# Patient Record
Sex: Female | Born: 1940
Health system: Southern US, Community
[De-identification: ages and names within clinical notes are randomized; demographics above are authoritative.]

## PROBLEM LIST (undated history)

## (undated) DIAGNOSIS — F329 Major depressive disorder, single episode, unspecified: Secondary | ICD-10-CM

## (undated) DIAGNOSIS — M810 Age-related osteoporosis without current pathological fracture: Secondary | ICD-10-CM

## (undated) DIAGNOSIS — M549 Dorsalgia, unspecified: Secondary | ICD-10-CM

## (undated) DIAGNOSIS — J029 Acute pharyngitis, unspecified: Secondary | ICD-10-CM

## (undated) DIAGNOSIS — G245 Blepharospasm: Secondary | ICD-10-CM

## (undated) DIAGNOSIS — M1990 Unspecified osteoarthritis, unspecified site: Secondary | ICD-10-CM

## (undated) DIAGNOSIS — R5383 Other fatigue: Secondary | ICD-10-CM

## (undated) DIAGNOSIS — H40039 Anatomical narrow angle, unspecified eye: Secondary | ICD-10-CM

## (undated) DIAGNOSIS — H02403 Unspecified ptosis of bilateral eyelids: Secondary | ICD-10-CM

## (undated) DIAGNOSIS — G609 Hereditary and idiopathic neuropathy, unspecified: Secondary | ICD-10-CM

## (undated) DIAGNOSIS — K219 Gastro-esophageal reflux disease without esophagitis: Secondary | ICD-10-CM

## (undated) DIAGNOSIS — M545 Low back pain: Secondary | ICD-10-CM

## (undated) DIAGNOSIS — K5730 Diverticulosis of large intestine without perforation or abscess without bleeding: Secondary | ICD-10-CM

## (undated) DIAGNOSIS — R5381 Other malaise: Secondary | ICD-10-CM

## (undated) DIAGNOSIS — J019 Acute sinusitis, unspecified: Secondary | ICD-10-CM

## (undated) DIAGNOSIS — R05 Cough: Secondary | ICD-10-CM

## (undated) DIAGNOSIS — R9431 Abnormal electrocardiogram [ECG] [EKG]: Secondary | ICD-10-CM

## (undated) DIAGNOSIS — R0600 Dyspnea, unspecified: Secondary | ICD-10-CM

## (undated) DIAGNOSIS — J310 Chronic rhinitis: Secondary | ICD-10-CM

## (undated) DIAGNOSIS — E785 Hyperlipidemia, unspecified: Secondary | ICD-10-CM

## (undated) DIAGNOSIS — J309 Allergic rhinitis, unspecified: Secondary | ICD-10-CM

## (undated) DIAGNOSIS — Z86010 Personal history of colonic polyps: Secondary | ICD-10-CM

## (undated) DIAGNOSIS — F411 Generalized anxiety disorder: Secondary | ICD-10-CM

## (undated) DIAGNOSIS — M5432 Sciatica, left side: Principal | ICD-10-CM

## (undated) HISTORY — DX: Sciatica, left side: M54.32

## (undated) HISTORY — DX: Generalized anxiety disorder: F41.1

## (undated) HISTORY — DX: Acute pharyngitis, unspecified: J02.9

## (undated) HISTORY — PX: TONSILLECTOMY: SUR1361

## (undated) HISTORY — DX: Allergic rhinitis, unspecified: J30.9

## (undated) HISTORY — PX: CATARACT EXTRACTION, BILATERAL: SHX1313

## (undated) HISTORY — DX: Personal history of colonic polyps: Z86.010

## (undated) HISTORY — DX: Abnormal electrocardiogram (ECG) (EKG): R94.31

## (undated) HISTORY — PX: EYE SURGERY: SHX253

## (undated) HISTORY — DX: Cough: R05

## (undated) HISTORY — DX: Hereditary and idiopathic neuropathy, unspecified: G60.9

## (undated) HISTORY — DX: Blepharospasm: G24.5

## (undated) HISTORY — DX: Other malaise: R53.81

## (undated) HISTORY — DX: Unspecified osteoarthritis, unspecified site: M19.90

## (undated) HISTORY — DX: Age-related osteoporosis without current pathological fracture: M81.0

## (undated) HISTORY — DX: Other fatigue: R53.83

## (undated) HISTORY — DX: Hyperlipidemia, unspecified: E78.5

## (undated) HISTORY — DX: Major depressive disorder, single episode, unspecified: F32.9

## (undated) HISTORY — DX: Diverticulosis of large intestine without perforation or abscess without bleeding: K57.30

## (undated) HISTORY — DX: Anatomical narrow angle, unspecified eye: H40.039

## (undated) HISTORY — PX: COLONOSCOPY: SHX174

## (undated) HISTORY — DX: Acute sinusitis, unspecified: J01.90

## (undated) HISTORY — PX: BREAST BIOPSY: SHX20

## (undated) HISTORY — DX: Dorsalgia, unspecified: M54.9

## (undated) HISTORY — DX: Chronic rhinitis: J31.0

## (undated) HISTORY — DX: Low back pain: M54.5

---

## 2001-09-16 HISTORY — PX: OTHER SURGICAL HISTORY: SHX169

## 2002-05-25 ENCOUNTER — Other Ambulatory Visit: Admission: RE | Admit: 2002-05-25 | Discharge: 2002-05-25 | Payer: Self-pay | Admitting: Obstetrics and Gynecology

## 2002-12-14 ENCOUNTER — Other Ambulatory Visit: Admission: RE | Admit: 2002-12-14 | Discharge: 2002-12-14 | Payer: Self-pay | Admitting: Obstetrics and Gynecology

## 2002-12-31 ENCOUNTER — Encounter: Payer: Self-pay | Admitting: Neurology

## 2002-12-31 ENCOUNTER — Encounter: Admission: RE | Admit: 2002-12-31 | Discharge: 2002-12-31 | Payer: Self-pay | Admitting: Neurology

## 2003-05-18 ENCOUNTER — Other Ambulatory Visit: Admission: RE | Admit: 2003-05-18 | Discharge: 2003-05-18 | Payer: Self-pay | Admitting: Obstetrics and Gynecology

## 2003-11-17 ENCOUNTER — Other Ambulatory Visit: Admission: RE | Admit: 2003-11-17 | Discharge: 2003-11-17 | Payer: Self-pay | Admitting: Obstetrics and Gynecology

## 2004-03-24 ENCOUNTER — Emergency Department (HOSPITAL_COMMUNITY): Admission: EM | Admit: 2004-03-24 | Discharge: 2004-03-24 | Payer: Self-pay | Admitting: Emergency Medicine

## 2004-03-26 ENCOUNTER — Emergency Department (HOSPITAL_COMMUNITY): Admission: EM | Admit: 2004-03-26 | Discharge: 2004-03-26 | Payer: Self-pay | Admitting: *Deleted

## 2004-05-22 ENCOUNTER — Other Ambulatory Visit: Admission: RE | Admit: 2004-05-22 | Discharge: 2004-05-22 | Payer: Self-pay | Admitting: Obstetrics and Gynecology

## 2005-04-15 ENCOUNTER — Ambulatory Visit: Payer: Self-pay | Admitting: Internal Medicine

## 2005-04-25 ENCOUNTER — Ambulatory Visit: Payer: Self-pay | Admitting: Internal Medicine

## 2005-06-19 ENCOUNTER — Other Ambulatory Visit: Admission: RE | Admit: 2005-06-19 | Discharge: 2005-06-19 | Payer: Self-pay | Admitting: Obstetrics and Gynecology

## 2005-06-27 ENCOUNTER — Ambulatory Visit: Payer: Self-pay | Admitting: Internal Medicine

## 2006-06-02 ENCOUNTER — Ambulatory Visit: Payer: Self-pay | Admitting: Internal Medicine

## 2006-06-09 ENCOUNTER — Ambulatory Visit: Payer: Self-pay | Admitting: Internal Medicine

## 2006-06-12 ENCOUNTER — Ambulatory Visit: Payer: Self-pay | Admitting: Internal Medicine

## 2006-07-24 ENCOUNTER — Ambulatory Visit: Payer: Self-pay | Admitting: Internal Medicine

## 2006-07-24 LAB — CONVERTED CEMR LAB
ALT: 19 units/L (ref 0–40)
AST: 21 units/L (ref 0–37)
Albumin: 3.7 g/dL (ref 3.5–5.2)
Alkaline Phosphatase: 56 units/L (ref 39–117)
Bilirubin, Direct: 0.1 mg/dL (ref 0.0–0.3)
Chol/HDL Ratio, serum: 2.3
Cholesterol: 187 mg/dL (ref 0–200)
HDL: 80.1 mg/dL (ref >39.0)
LDL Cholesterol: 96 mg/dL (ref 0–99)
Total Bilirubin: 0.9 mg/dL (ref 0.3–1.2)
Total Protein: 7.2 g/dL (ref 6.0–8.3)
Triglyceride fasting, serum: 53 mg/dL (ref 0–149)
VLDL: 11 mg/dL (ref 0–40)

## 2007-06-08 ENCOUNTER — Encounter: Payer: Self-pay | Admitting: *Deleted

## 2007-06-08 DIAGNOSIS — G609 Hereditary and idiopathic neuropathy, unspecified: Secondary | ICD-10-CM

## 2007-06-08 HISTORY — DX: Hereditary and idiopathic neuropathy, unspecified: G60.9

## 2007-06-25 ENCOUNTER — Ambulatory Visit: Payer: Self-pay | Admitting: Internal Medicine

## 2007-06-25 LAB — CONVERTED CEMR LAB
ALT: 22 units/L (ref 0–35)
AST: 23 units/L (ref 0–37)
Albumin: 4.1 g/dL (ref 3.5–5.2)
Alkaline Phosphatase: 61 units/L (ref 39–117)
BUN: 16 mg/dL (ref 6–23)
Basophils Absolute: 0 10*3/uL (ref 0.0–0.1)
Basophils Relative: 0.8 % (ref 0.0–1.0)
Bilirubin, Direct: 0.1 mg/dL (ref 0.0–0.3)
CO2: 33 meq/L — ABNORMAL HIGH (ref 19–32)
Calcium: 9.9 mg/dL (ref 8.4–10.5)
Chloride: 105 meq/L (ref 96–112)
Cholesterol: 201 mg/dL (ref 0–200)
Creatinine, Ser: 0.8 mg/dL (ref 0.4–1.2)
Direct LDL: 112.4 mg/dL
Eosinophils Absolute: 0.1 10*3/uL (ref 0.0–0.6)
Eosinophils Relative: 3.7 % (ref 0.0–5.0)
GFR calc Af Amer: 92 mL/min
GFR calc non Af Amer: 76 mL/min
Glucose, Bld: 84 mg/dL (ref 70–99)
HCT: 45.7 % (ref 36.0–46.0)
HDL: 75.5 mg/dL (ref >39.0)
Hemoglobin: 15.2 g/dL — ABNORMAL HIGH (ref 12.0–15.0)
Lymphocytes Relative: 33.4 % (ref 12.0–46.0)
MCHC: 33.3 g/dL (ref 30.0–36.0)
MCV: 85 fL (ref 78.0–100.0)
Monocytes Absolute: 0.3 10*3/uL (ref 0.2–0.7)
Monocytes Relative: 7.4 % (ref 3.0–11.0)
Neutro Abs: 2.1 10*3/uL (ref 1.4–7.7)
Neutrophils Relative %: 54.7 % (ref 43.0–77.0)
Platelets: 208 10*3/uL (ref 150–400)
Potassium: 3.9 meq/L (ref 3.5–5.1)
RBC: 5.38 M/uL — ABNORMAL HIGH (ref 3.87–5.11)
RDW: 13 % (ref 11.5–14.6)
Sodium: 143 meq/L (ref 135–145)
TSH: 0.99 microintl units/mL (ref 0.35–5.50)
Total Bilirubin: 0.8 mg/dL (ref 0.3–1.2)
Total CHOL/HDL Ratio: 2.7
Total Protein: 7.5 g/dL (ref 6.0–8.3)
Triglycerides: 95 mg/dL (ref 0–149)
VLDL: 19 mg/dL (ref 0–40)
WBC: 3.8 10*3/uL — ABNORMAL LOW (ref 4.5–10.5)

## 2007-07-03 ENCOUNTER — Encounter: Payer: Self-pay | Admitting: Internal Medicine

## 2007-07-03 ENCOUNTER — Ambulatory Visit: Payer: Self-pay

## 2007-07-15 ENCOUNTER — Telehealth (INDEPENDENT_AMBULATORY_CARE_PROVIDER_SITE_OTHER): Payer: Self-pay | Admitting: *Deleted

## 2007-07-17 ENCOUNTER — Ambulatory Visit: Payer: Self-pay | Admitting: Internal Medicine

## 2007-07-19 ENCOUNTER — Encounter: Payer: Self-pay | Admitting: Internal Medicine

## 2007-07-19 DIAGNOSIS — F329 Major depressive disorder, single episode, unspecified: Secondary | ICD-10-CM

## 2007-07-19 DIAGNOSIS — Z8669 Personal history of other diseases of the nervous system and sense organs: Secondary | ICD-10-CM | POA: Insufficient documentation

## 2007-07-19 DIAGNOSIS — E785 Hyperlipidemia, unspecified: Secondary | ICD-10-CM | POA: Insufficient documentation

## 2007-07-19 DIAGNOSIS — M545 Low back pain, unspecified: Secondary | ICD-10-CM | POA: Insufficient documentation

## 2007-07-19 DIAGNOSIS — R9431 Abnormal electrocardiogram [ECG] [EKG]: Secondary | ICD-10-CM | POA: Insufficient documentation

## 2007-07-19 DIAGNOSIS — K5730 Diverticulosis of large intestine without perforation or abscess without bleeding: Secondary | ICD-10-CM | POA: Insufficient documentation

## 2007-07-19 DIAGNOSIS — J309 Allergic rhinitis, unspecified: Secondary | ICD-10-CM | POA: Insufficient documentation

## 2007-07-19 DIAGNOSIS — F411 Generalized anxiety disorder: Secondary | ICD-10-CM

## 2007-07-19 DIAGNOSIS — F418 Other specified anxiety disorders: Secondary | ICD-10-CM | POA: Insufficient documentation

## 2007-07-19 DIAGNOSIS — M1990 Unspecified osteoarthritis, unspecified site: Secondary | ICD-10-CM

## 2007-07-19 DIAGNOSIS — Z860100 Personal history of colon polyps, unspecified: Secondary | ICD-10-CM

## 2007-07-19 DIAGNOSIS — F32A Depression, unspecified: Secondary | ICD-10-CM | POA: Insufficient documentation

## 2007-07-19 DIAGNOSIS — M810 Age-related osteoporosis without current pathological fracture: Secondary | ICD-10-CM | POA: Insufficient documentation

## 2007-07-19 DIAGNOSIS — F3289 Other specified depressive episodes: Secondary | ICD-10-CM

## 2007-07-19 HISTORY — DX: Diverticulosis of large intestine without perforation or abscess without bleeding: K57.30

## 2007-07-19 HISTORY — DX: Low back pain, unspecified: M54.50

## 2007-07-19 HISTORY — DX: Hyperlipidemia, unspecified: E78.5

## 2007-07-19 HISTORY — DX: Personal history of colonic polyps: Z86.010

## 2007-07-19 HISTORY — DX: Personal history of colon polyps, unspecified: Z86.0100

## 2007-07-19 HISTORY — DX: Other specified depressive episodes: F32.89

## 2007-07-19 HISTORY — DX: Generalized anxiety disorder: F41.1

## 2007-07-19 HISTORY — DX: Major depressive disorder, single episode, unspecified: F32.9

## 2007-07-19 HISTORY — DX: Allergic rhinitis, unspecified: J30.9

## 2007-07-19 HISTORY — DX: Unspecified osteoarthritis, unspecified site: M19.90

## 2007-07-19 HISTORY — DX: Age-related osteoporosis without current pathological fracture: M81.0

## 2007-07-19 HISTORY — DX: Abnormal electrocardiogram (ECG) (EKG): R94.31

## 2007-09-22 ENCOUNTER — Ambulatory Visit: Payer: Self-pay | Admitting: Internal Medicine

## 2007-09-22 DIAGNOSIS — R059 Cough, unspecified: Secondary | ICD-10-CM

## 2007-09-22 DIAGNOSIS — R05 Cough: Secondary | ICD-10-CM

## 2007-09-22 HISTORY — DX: Cough, unspecified: R05.9

## 2007-10-07 ENCOUNTER — Ambulatory Visit (HOSPITAL_COMMUNITY): Admission: RE | Admit: 2007-10-07 | Discharge: 2007-10-07 | Payer: Self-pay | Admitting: Internal Medicine

## 2007-10-07 ENCOUNTER — Encounter: Payer: Self-pay | Admitting: Internal Medicine

## 2007-11-02 ENCOUNTER — Ambulatory Visit: Payer: Self-pay | Admitting: Internal Medicine

## 2007-11-04 ENCOUNTER — Ambulatory Visit: Payer: Self-pay | Admitting: Internal Medicine

## 2007-11-04 DIAGNOSIS — J019 Acute sinusitis, unspecified: Secondary | ICD-10-CM

## 2007-11-04 HISTORY — DX: Acute sinusitis, unspecified: J01.90

## 2008-06-06 ENCOUNTER — Ambulatory Visit: Payer: Self-pay | Admitting: Internal Medicine

## 2008-06-06 DIAGNOSIS — G245 Blepharospasm: Secondary | ICD-10-CM

## 2008-06-06 HISTORY — DX: Blepharospasm: G24.5

## 2008-06-15 ENCOUNTER — Encounter: Payer: Self-pay | Admitting: Internal Medicine

## 2008-10-05 ENCOUNTER — Ambulatory Visit: Payer: Self-pay | Admitting: Internal Medicine

## 2008-10-20 ENCOUNTER — Ambulatory Visit: Payer: Self-pay | Admitting: Internal Medicine

## 2008-12-12 IMAGING — CR DG CHEST 2V
1 series · 1 of 1 positions shown · non-contrast
Comparison: 06/27/05.

CLINICAL DATA: Cough.  
 CHEST ? 2 VIEW:

[view not recorded]
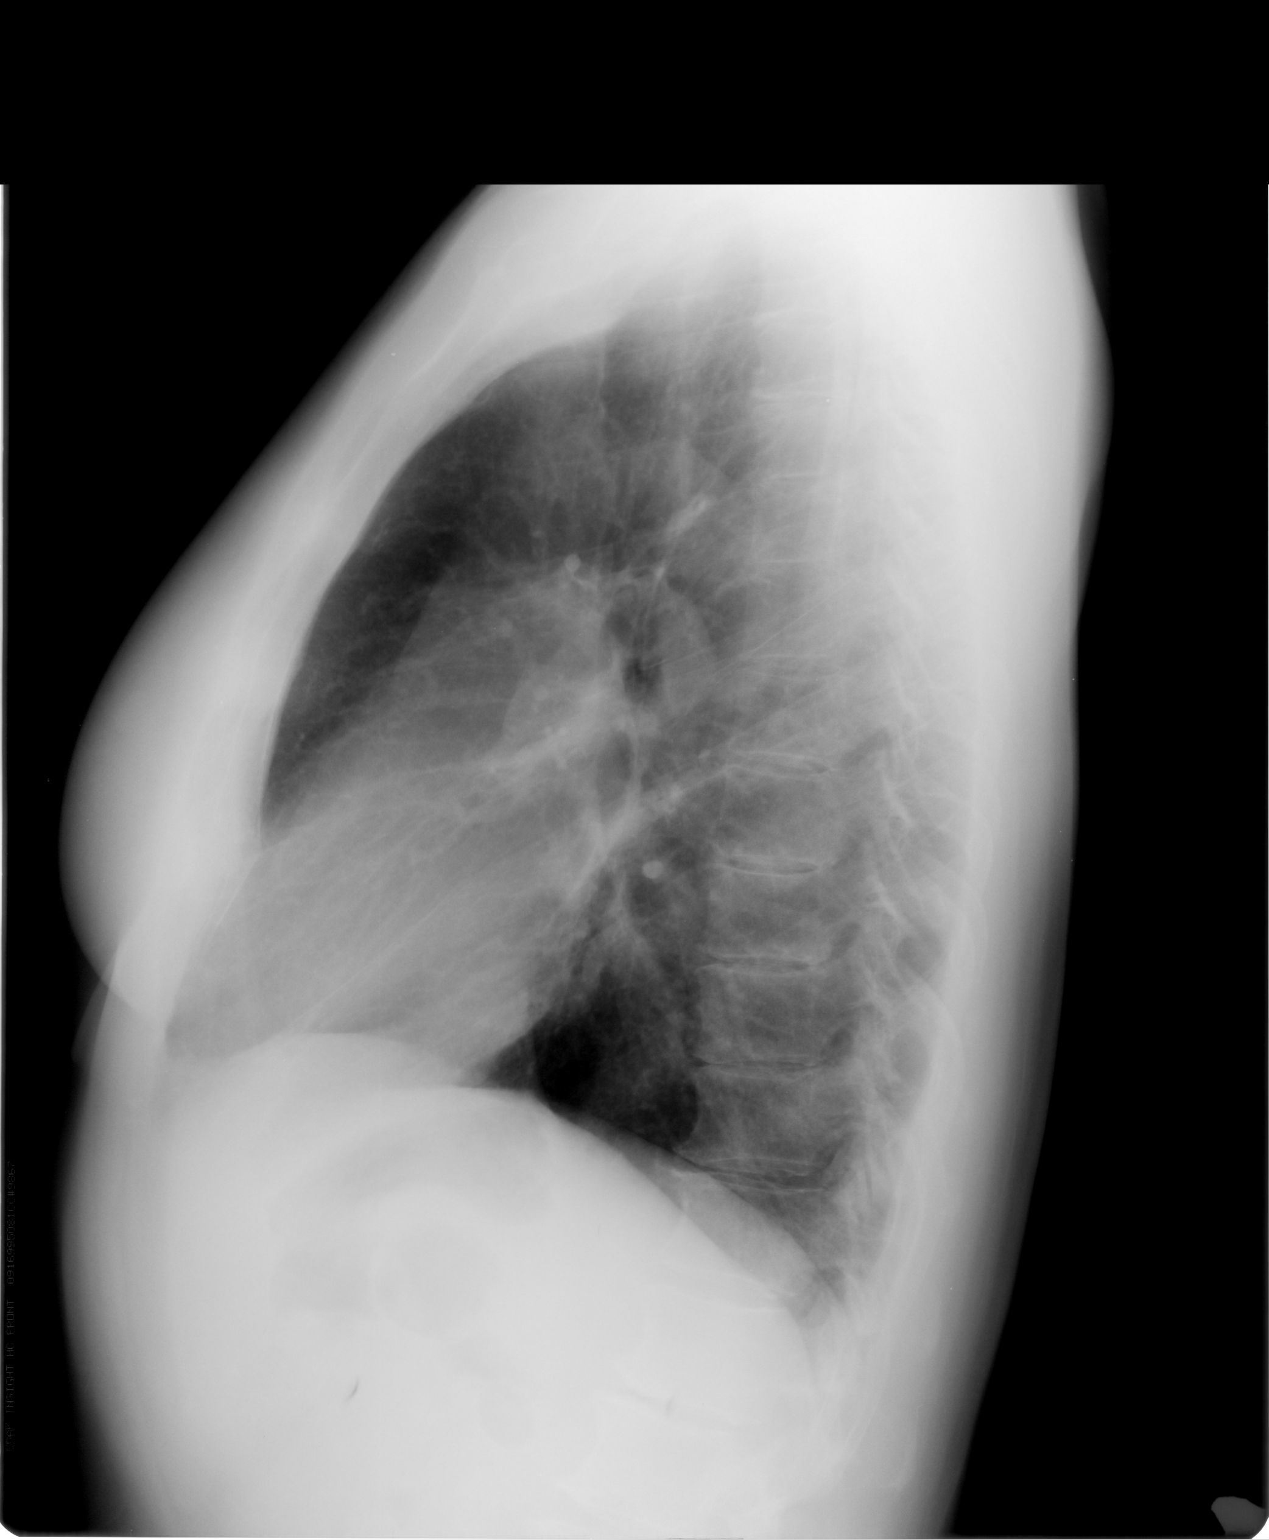

[1 of 1 positions shown; findings below may reference images not displayed]

FINDINGS: Heart size is normal.  The mediastinum is unremarkable.  The lungs are clear.  No effusions.  Mild scoliosis and degenerative change affects the spine.
IMPRESSION: No change.  No active disease.

## 2009-02-27 ENCOUNTER — Ambulatory Visit: Payer: Self-pay | Admitting: Internal Medicine

## 2009-02-27 DIAGNOSIS — J310 Chronic rhinitis: Secondary | ICD-10-CM | POA: Insufficient documentation

## 2009-02-27 DIAGNOSIS — J029 Acute pharyngitis, unspecified: Secondary | ICD-10-CM

## 2009-02-27 HISTORY — DX: Chronic rhinitis: J31.0

## 2009-02-27 HISTORY — DX: Acute pharyngitis, unspecified: J02.9

## 2009-04-20 ENCOUNTER — Ambulatory Visit: Payer: Self-pay | Admitting: Internal Medicine

## 2009-04-20 DIAGNOSIS — R5381 Other malaise: Secondary | ICD-10-CM | POA: Insufficient documentation

## 2009-04-20 DIAGNOSIS — R5383 Other fatigue: Secondary | ICD-10-CM | POA: Insufficient documentation

## 2009-04-20 DIAGNOSIS — H40039 Anatomical narrow angle, unspecified eye: Secondary | ICD-10-CM

## 2009-04-20 DIAGNOSIS — H40219 Acute angle-closure glaucoma, unspecified eye: Secondary | ICD-10-CM | POA: Insufficient documentation

## 2009-04-20 HISTORY — DX: Other malaise: R53.81

## 2009-04-20 HISTORY — DX: Anatomical narrow angle, unspecified eye: H40.039

## 2009-05-01 ENCOUNTER — Telehealth: Payer: Self-pay | Admitting: Internal Medicine

## 2009-06-16 LAB — CONVERTED CEMR LAB: Pap Smear: NORMAL

## 2009-11-08 ENCOUNTER — Ambulatory Visit: Payer: Self-pay | Admitting: Internal Medicine

## 2009-11-08 DIAGNOSIS — M549 Dorsalgia, unspecified: Secondary | ICD-10-CM

## 2009-11-08 HISTORY — DX: Dorsalgia, unspecified: M54.9

## 2010-05-09 ENCOUNTER — Ambulatory Visit: Payer: Self-pay | Admitting: Internal Medicine

## 2010-05-09 LAB — CONVERTED CEMR LAB
AST: 18 units/L (ref 0–37)
Albumin: 3.9 g/dL (ref 3.5–5.2)
Alkaline Phosphatase: 49 units/L (ref 39–117)
Basophils Absolute: 0 10*3/uL (ref 0.0–0.1)
Basophils Relative: 0.5 % (ref 0.0–3.0)
Calcium: 9.6 mg/dL (ref 8.4–10.5)
Cholesterol: 223 mg/dL — ABNORMAL HIGH (ref 0–200)
Direct LDL: 127.4 mg/dL
Folate: 13 ng/mL
GFR calc non Af Amer: 85.29 mL/min (ref >60)
Hemoglobin: 14.2 g/dL (ref 12.0–15.0)
Ketones, ur: NEGATIVE mg/dL
Lymphocytes Relative: 35.7 % (ref 12.0–46.0)
Monocytes Relative: 9 % (ref 3.0–12.0)
Neutro Abs: 2.2 10*3/uL (ref 1.4–7.7)
RBC: 4.85 M/uL (ref 3.87–5.11)
RDW: 14 % (ref 11.5–14.6)
Sodium: 142 meq/L (ref 135–145)
Specific Gravity, Urine: >=1.03 (ref 1.000–1.030)
Total Protein, Urine: NEGATIVE mg/dL
Transferrin: 256.1 mg/dL (ref 212.0–360.0)
Urine Glucose: NEGATIVE mg/dL
Urobilinogen, UA: 0.2 (ref 0.0–1.0)
VLDL: 23.2 mg/dL (ref 0.0–40.0)
Vitamin B-12: 714 pg/mL (ref 211–911)
pH: 5 (ref 5.0–8.0)

## 2010-10-14 LAB — CONVERTED CEMR LAB
Albumin: 3.8 g/dL (ref 3.5–5.2)
BUN: 20 mg/dL (ref 6–23)
Basophils Absolute: 0 10*3/uL (ref 0.0–0.1)
Basophils Relative: 0.3 % (ref 0.0–3.0)
Bilirubin Urine: NEGATIVE
Bilirubin, Direct: 0.1 mg/dL (ref 0.0–0.3)
CO2: 28 meq/L (ref 19–32)
Chloride: 104 meq/L (ref 96–112)
Cholesterol: 217 mg/dL — ABNORMAL HIGH (ref 0–200)
Creatinine, Ser: 0.7 mg/dL (ref 0.4–1.2)
Direct LDL: 123.7 mg/dL
Eosinophils Absolute: 0.2 10*3/uL (ref 0.0–0.7)
Glucose, Bld: 78 mg/dL (ref 70–99)
Ketones, ur: NEGATIVE mg/dL
MCHC: 34.8 g/dL (ref 30.0–36.0)
MCV: 83.1 fL (ref 78.0–100.0)
Monocytes Absolute: 0.4 10*3/uL (ref 0.1–1.0)
Neutrophils Relative %: 48.3 % (ref 43.0–77.0)
Pap Smear: NORMAL
RBC: 5.03 M/uL (ref 3.87–5.11)
RDW: 13.6 % (ref 11.5–14.6)
Total Protein, Urine: NEGATIVE mg/dL
Total Protein: 6.8 g/dL (ref 6.0–8.3)
Triglycerides: 110 mg/dL (ref 0.0–149.0)
Vit D, 25-Hydroxy: 27 ng/mL — ABNORMAL LOW (ref 30–89)
pH: 6 (ref 5.0–8.0)

## 2010-10-16 NOTE — Assessment & Plan Note (Signed)
Summary: BACK PAIN--STC   Vital Signs:  Patient profile:   70 year old female Height:      65.5 inches Weight:      148.13 pounds BMI:     24.36 O2 Sat:      99 % on Room air Temp:     98 degrees F oral Pulse rate:   73 / minute BP sitting:   100 / 70  (left arm) Cuff size:   regular  Vitals Entered ByZella Ball Ewing (November 08, 2009 8:16 AM)  O2 Flow:  Room air CC: Back Pain/RE   Primary Care Provider:  Jonny Ruiz  CC:  Back Pain/RE.  History of Present Illness: here with gradually owrsening LBP for 4 to 6 months since last seen, seemed to start with yardwork last fall, then with yoga (spends one weekend per month since last seen with 11 yr per day yoga excericses) and shoveling snoww off and on since then;  is right handed, seems bilat flank and lower back, and somewhat on the right which is particualry with the triangle pose of the yoga involving bending to the right while standing; heating pad, alleve no help or mild or temp at best; no fever, wt loss, night sweats,  bowel or bladder changes; or LE pain, weakness or numbness.  Has some stretcdhing of the calf muscles she thinks but does not believe related.  Overall pain mild to mod.  Has been a problem for short periods in the past , and saw dr love Azzie Almas and tx iwth pred pack years ago that seemed to help.  No falls or trauma,.  Benidng sideways makes it the worst left or right,  soaks with epson salts seems to help as well. More yoga seems to help except when bending as above.    Problems Prior to Update: 1)  Back Pain  (ICD-724.5) 2)  Fatigue  (ICD-780.79) 3)  Acute Angle-closure Glaucoma  (ICD-365.22) 4)  Rhinitis  (ICD-472.0) 5)  Sore Throat  (ICD-462) 6)  Blepharospasm  (ICD-333.81) 7)  Sinusitis- Acute-nos  (ICD-461.9) 8)  Cough  (ICD-786.2) 9)  Family History of Colon Ca 1st Degree Relative <60  (ICD-V16.0) 10)  Abnormal Electrocardiogram  (ICD-794.31) 11)  Osteoporosis  (ICD-733.00) 12)  Osteoarthritis   (ICD-715.90) 13)  Low Back Pain  (ICD-724.2) 14)  Hyperlipidemia  (ICD-272.4) 15)  Macular Degeneration, Hx of  (ICD-V12.49) 16)  Depression  (ICD-311) 17)  Anxiety  (ICD-300.00) 18)  Allergic Rhinitis  (ICD-477.9) 19)  Neoplasm, Malignant, Colon, Family Hx, Mother  (ICD-V16.0) 20)  Diverticulosis, Colon  (ICD-562.10) 21)  Colonic Polyps, Hx of  (ICD-V12.72) 22)  Peripheral Neuropathy  (ICD-356.9)  Medications Prior to Update: 1)  Flonase 50 Mcg/act  Susp (Fluticasone Propionate) .... 2 Sprays Each Nostril Once Daily 2)  Adult Aspirin Ec Low Strength 81 Mg Tbec (Aspirin) .Marland Kitchen.. 1 By Mouth Once Daily 3)  Zyrtec Allergy 10 Mg Tabs (Cetirizine Hcl) .... Take 1 Tablet By Mouth Once A Day 4)  Lansoprazole 30 Mg Cpdr (Lansoprazole) .Marland Kitchen.. 1po Once Daily 5)  Pravachol 20 Mg Tabs (Pravastatin Sodium) .Marland Kitchen.. 1 By Mouth Once Daily  Current Medications (verified): 1)  Flonase 50 Mcg/act  Susp (Fluticasone Propionate) .... 2 Sprays Each Nostril Once Daily 2)  Adult Aspirin Ec Low Strength 81 Mg Tbec (Aspirin) .Marland Kitchen.. 1 By Mouth Once Daily 3)  Zyrtec Allergy 10 Mg Tabs (Cetirizine Hcl) .... Take 1 Tablet By Mouth Once A Day 4)  Lansoprazole 30 Mg Cpdr (Lansoprazole) .Marland KitchenMarland KitchenMarland Kitchen  1po Once Daily 5)  Pravachol 20 Mg Tabs (Pravastatin Sodium) .Marland Kitchen.. 1 By Mouth Once Daily 6)  Prednisone 10 Mg Tabs (Prednisone) .... 3po Qd For 3days, Then 2po Qd For 3days, Then 1po Qd For 3days, Then Stop 7)  Meloxicam 15 Mg Tabs (Meloxicam) .Marland Kitchen.. 1po Once Daily As Needed  Allergies (verified): 1)  ! Vicodin  Past History:  Past Surgical History: Last updated: 07/22/2007 Tonsillectomy Hammer Toe Surgery, bilaterally 2003 Breast biopsy  Social History: Last updated: 07/22/2007 Never Smoked Alcohol use-yes  Risk Factors: Smoking Status: never (07/22/2007)  Past Medical History: PERIPHERAL NEUROPATHY (ICD-356.9) Colonic polyps, hx of Diverticulosis, colon Family History of Colon Cancer Allergic  rhinitis Anxiety Asthma Depression, history of History of Macular Degeneration Hyperlipidemia Low back pain Osteoarthritis Osteoporosis Abnormal EKG acute angle glaucoma   Review of Systems       all otherwise negative per pt -   Physical Exam  General:  alert and well-developed.   Head:  normocephalic and atraumatic.   Eyes:  vision grossly intact, pupils equal, and pupils round.   Ears:  R ear normal and L ear normal.   Nose:  no external deformity and no nasal discharge.   Mouth:  no gingival abnormalities and pharynx pink and moist.   Neck:  supple and no masses.   Lungs:  normal respiratory effort and normal breath sounds.   Heart:  normal rate and regular rhythm.   Abdomen:  soft, non-tender, and normal bowel sounds.   Msk:  no joint tenderness and no joint swelling.  , spine nontender throughout , no spine paravertebral tender Extremities:  no edema, no erythema  Neurologic:  cranial nerves II-XII intact, strength normal in all extremities, and sensation intact to light touch.     Impression & Recommendations:  Problem # 1:  BACK PAIN (ICD-724.5)  Her updated medication list for this problem includes:    Adult Aspirin Ec Low Strength 81 Mg Tbec (Aspirin) .Marland Kitchen... 1 by mouth once daily    Meloxicam 15 Mg Tabs (Meloxicam) .Marland Kitchen... 1po once daily as needed  Orders: T-Thoracic Spine 2 Views [phone]) hx and exam most c/w prob underlying DJD/DDD - treat as above, f/u any worsening signs or symptoms , check film, consider ortho ir persists or worsens  Complete Medication List: 1)  Flonase 50 Mcg/act Susp (Fluticasone propionate) .... 2 sprays each nostril once daily 2)  Adult Aspirin Ec Low Strength 81 Mg Tbec (Aspirin) .Marland Kitchen.. 1 by mouth once daily 3)  Zyrtec Allergy 10 Mg Tabs (Cetirizine hcl) .... Take 1 tablet by mouth once a day 4)  Lansoprazole 30 Mg Cpdr (Lansoprazole) .Marland Kitchen.. 1po once daily 5)  Pravachol 20 Mg Tabs (Pravastatin sodium) .Marland Kitchen.. 1 by mouth once daily 6)   Prednisone 10 Mg Tabs (Prednisone) .... 3po qd for 3days, then 2po qd for 3days, then 1po qd for 3days, then stop 7)  Meloxicam 15 Mg Tabs (Meloxicam) .Marland Kitchen.. 1po once daily as needed  Patient Instructions: 1)  Please go to Radiology in the basement level for your X-Ray today  2)  Please take all new medications as prescribed 3)  Continue all previous medications as before this visit  4)  Please schedule a follow-up appointment in 6 months for your "yearly exam" Prescriptions: PREDNISONE 10 MG TABS (PREDNISONE) 3po qd for 3days, then 2po qd for 3days, then 1po qd for 3days, then stop  #18 x 0   Entered and Authorized by:   Corwin Levins MD   Signed  by:   Corwin Levins MD on 11/08/2009   Method used:   Electronically to        [facility] [phone]* (retail)       [address]       [place], [place]  [address]       Ph: [phone]       Fax: [phone]   RxID:   [id number] MELOXICAM 15 MG TABS (MELOXICAM) 1po once daily as needed  #90 x 3   Entered and Authorized by:   Corwin Levins MD   Signed by:   Corwin Levins MD on 11/08/2009   Method used:   Electronically to        [facility]. [phone]* (retail)       [address]       [place], [place]  [address]       Ph: [phone]       Fax: [phone]   RxID:   [id number] MELOXICAM 15 MG TABS (MELOXICAM) 1po once daily as needed  #90 x 3   Entered and Authorized by:   Corwin Levins MD   Signed by:   Corwin Levins MD on 11/08/2009   Method used:   Electronically to        [facility]  [phone]* (retail)       [address]       [place], [place]  [address]       Ph: [phone] or [phone]       Fax: [phone]   RxID:   [id number] PREDNISONE 10 MG TABS (PREDNISONE) 3po qd for 3days, then 2po qd for 3days, then 1po qd for 3days, then stop  #18 x 0   Entered and Authorized by:   Corwin Levins MD   Signed by:   Corwin Levins MD on 11/08/2009   Method used:   Electronically to        [facility]  [phone]* (retail)       [address]       [place], [place]  [address]       Ph: [phone] or [phone]       Fax: [phone]   RxID:   [id number]

## 2010-10-16 NOTE — Assessment & Plan Note (Signed)
Summary: 6 mos f/u // #/ cd   Vital Signs:  Patient profile:   70 year old female Height:      65.5 inches Weight:      144.75 pounds BMI:     23.81 O2 Sat:      96 % on Room air Temp:     97.5 degrees F oral Pulse rate:   69 / minute BP sitting:   100 / 66  (left arm) Cuff size:   regular  Vitals Entered By: Zella Ball Ewing CMA Duncan Dull) (May 09, 2010 1:28 PM)  O2 Flow:  Room air  Preventive Care Screening  Pap Smear:    Date:  06/16/2009    Results:  normal   Mammogram:    Date:  06/16/2009    Results:  normal      declines shingles   CC: 6 month followup/RE/wellness   Primary Care Provider:  Jonny Ruiz  CC:  6 month followup/RE/wellness.  History of Present Illness: her to f/u - meloxicam seems to help the back pain now controlled;  also helps the pain to hang upside down at the yoga class for 4 min ;  trying to follow lower chol diet and be more active;  has an unusual midl to mod  cough non prod for several weeks to months but assoc with freq reflux it seems without dysphagia, n/v, abd pain, fever , ST, headache or increased allergy or asthma symtpoms, adn cant seem to improve with diet changes; has not tried any OTC antacids.  No longer taking the zantac or lansoprazole for some reason.     Does have ongoing mild fatigue as well, but no OSA symtpoms or worening depression, suicidal ideation or panic.    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    Problems Prior  to Update: 1)  Fatigue  (ICD-780.79) 2)  Back Pain  (ICD-724.5) 3)  Fatigue  (ICD-780.79) 4)  Acute Angle-closure Glaucoma  (ICD-365.22) 5)  Rhinitis  (ICD-472.0) 6)  Sore Throat  (ICD-462) 7)  Blepharospasm  (ICD-333.81) 8)  Sinusitis- Acute-nos  (ICD-461.9) 9)  Cough  (ICD-786.2) 10)  Family History of Colon Ca 1st Degree Relative <60  (ICD-V16.0) 11)  Abnormal Electrocardiogram  (ICD-794.31) 12)  Osteoporosis  (ICD-733.00) 13)  Osteoarthritis  (ICD-715.90) 14)  Low Back Pain  (ICD-724.2) 15)  Hyperlipidemia  (ICD-272.4) 16)  Macular Degeneration, Hx of  (ICD-V12.49) 17)  Depression  (ICD-311) 18)  Anxiety  (ICD-300.00) 19)  Allergic Rhinitis  (ICD-477.9) 20)  Neoplasm, Malignant, Colon, Family Hx, Mother  (ICD-V16.0) 25)  Diverticulosis, Colon  (ICD-562.10) 22)  Colonic Polyps, Hx of  (ICD-V12.72) 23)  Peripheral Neuropathy  (ICD-356.9)  Medications Prior to Update: 1)  Flonase 50 Mcg/act  Susp (Fluticasone Propionate) .... 2 Sprays Each Nostril Once Daily 2)  Adult Aspirin Ec Low Strength 81 Mg Tbec (Aspirin) .Marland Kitchen.. 1 By Mouth Once Daily 3)  Zyrtec Allergy 10 Mg Tabs (Cetirizine Hcl) .... Take 1 Tablet By Mouth Once A Day 4)  Lansoprazole 30  Mg Cpdr (Lansoprazole) .Marland Kitchen.. 1po Once Daily 5)  Pravachol 20 Mg Tabs (Pravastatin Sodium) .Marland Kitchen.. 1 By Mouth Once Daily 6)  Prednisone 10 Mg Tabs (Prednisone) .... 3po Qd For 3days, Then 2po Qd For 3days, Then 1po Qd For 3days, Then Stop 7)  Meloxicam 15 Mg Tabs (Meloxicam) .Marland Kitchen.. 1po Once Daily As Needed  Current Medications (verified): 1)  Adult Aspirin Ec Low Strength 81 Mg Tbec (Aspirin) .Marland Kitchen.. 1 By Mouth Once Daily 2)  Pravachol 20 Mg Tabs (Pravastatin Sodium) .Marland Kitchen.. 1 By Mouth Once Daily 3)  Meloxicam 15 Mg Tabs (Meloxicam) .Marland Kitchen.. 1po Once Daily As Needed 4)  Vitamin D 1000 Unit Tabs (Cholecalciferol) .Marland Kitchen.. 1 By Mouth Once Daily 5)  Tessalon Perles 100 Mg Caps (Benzonatate) .Marland Kitchen.. 1-2 By Mouth Three Times A Day As Needed 6)  Dexilant 60 Mg Cpdr  (Dexlansoprazole) .Marland Kitchen.. 1po Two Times A Day  Allergies (verified): 1)  ! 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   Risk Factors: Smoking Status: never (07/22/2007)  Past Medical History: PERIPHERAL NEUROPATHY (ICD-356.9) Colonic polyps, hx of Diverticulosis, colon Family History of Colon Cancer Allergic rhinitis Anxiety Asthma Depression, history of History of Macular Degeneration Hyperlipidemia Low back pain Osteoarthritis Osteoporosis Abnormal EKG acute angle glaucoma   MD roster:  optho - Dr Eulah Pont                     GYN - Dr Arelia Sneddon                    Derm - Dr Emily Filbert                    GI - Dr Juanda Chance  Past Surgical History: Reviewed history from 07/22/2007 and no changes required. Tonsillectomy Hammer Toe Surgery, bilaterally 2003 Breast biopsy  Family History: Reviewed history from 07/22/2007 and no changes required. Family History of Colon CA 1st degree relative  - mother  Social History: Reviewed history from 07/22/2007 and no changes required. 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         all otherwise negative per pt -    Physical Exam  General:  alert and well-developed.   Head:  normocephalic and atraumatic.   Eyes:  vision grossly intact, pupils equal, and pupils round.   Ears:  R ear normal and L ear normal.   Nose:  no external deformity and no nasal discharge.   Mouth:  no gingival abnormalities and pharynx pink and moist.   Neck:  supple and no masses.   Lungs:  normal respiratory effort and normal breath sounds.   Heart:  normal rate and regular rhythm.   Abdomen:  soft, non-tender, and normal bowel sounds.  Msk:  no joint tenderness and no joint swelling.   Extremities:  no edema, no erythema  Neurologic:  cranial nerves II-XII intact and strength normal in all extremities.  cognitive intact to orientation, recall, naming, and repetition  Skin:  color normal and no rashes.   Psych:  not depressed appearing and slightly anxious.     Impression & Recommendations:  Problem # 1:  Preventive Health Care (ICD-V70.0) Overall doing well, age appropriate education and counseling updated and referral for appropriate preventive services done unless declined, immunizations up to date or declined, diet counseling done if overweight, urged to quit smoking if smokes , most recent labs reviewed and current ordered if appropriate, ecg reviewed or declined (interpretation per ECG scanned in the EMR if done); information regarding Medicare Prevention requirements given if appropriate; speciality referrals updated as appropriate   Problem # 2:  FATIGUE (ICD-780.79)  exam benign, to check labs below; follow with expectant management   Orders: TLB-BMP (Basic Metabolic Panel-BMET) (80048-METABOL) TLB-CBC Platelet - w/Differential (85025-CBCD) TLB-Hepatic/Liver Function Pnl (80076-HEPATIC) TLB-TSH (Thyroid Stimulating Hormone) (84443-TSH) TLB-Sedimentation Rate (ESR) (85652-ESR) TLB-IBC Pnl (Iron/FE;Transferrin) (83550-IBC) TLB-B12 + Folate Pnl  (82746_82607-B12/FOL)  Problem # 3:  COUGH (ICD-786.2)  to try dexliant trial two times a day , and tess perle as needed   Orders: Prescription Created Electronically [phone])  Problem # 4:  HYPERLIPIDEMIA (ICD-272.4)  Her updated medication list for this problem includes:    Pravachol 20 Mg Tabs (Pravastatin sodium) .Marland Kitchen... 1 by mouth once daily  Orders: TLB-Lipid Panel (80061-LIPID)  Labs Reviewed: SGOT: 19 (04/20/2009)   SGPT: 16 (04/20/2009)   HDL:70.10 (04/20/2009), 75.5 (06/25/2007)  LDL:DEL (06/25/2007), 96 (66/44/0347)  Chol:217 (04/20/2009), 201 (06/25/2007)  Trig:110.0 (04/20/2009), 95 (06/25/2007) stable overall by hx and exam, ok to continue meds/tx as is , Pt to continue diet efforts, good med tolerance; to check labs - goal LDL less than 100  Problem # 5:  DEPRESSION (ICD-311) stable overall by hx and exam, ok to continue meds/tx as is   Complete Medication List: 1)  Adult Aspirin Ec Low Strength 81 Mg Tbec (Aspirin) .Marland Kitchen.. 1 by mouth once daily 2)  Pravachol 20 Mg Tabs (Pravastatin sodium) .Marland Kitchen.. 1 by mouth once daily 3)  Meloxicam 15 Mg Tabs (Meloxicam) .Marland Kitchen.. 1po once daily as needed 4)  Vitamin D 1000 Unit Tabs (Cholecalciferol) .Marland Kitchen.. 1 by mouth once daily 5)  Tessalon Perles 100 Mg Caps (Benzonatate) .Marland Kitchen.. 1-2 by mouth three times a day as needed 6)  Dexilant 60 Mg Cpdr (Dexlansoprazole) .Marland Kitchen.. 1po two times a day  Other Orders: TLB-Udip ONLY (81003-UDIP)  Patient Instructions: 1)  try the dexilant 60 mg at twice per day (if no diarrhea) until samples gone 2)  If the cough improved, please call for presccription 3)  Please take all new medications as prescribed  - the tessalon perle 4)  Continue all previous medications as before this visit  5)  Please go to the Lab in the basement for your blood and/or urine tests today 6)  Please schedule a follow-up appointment in 1 year or sooner if needed Prescriptions: TESSALON PERLES 100 MG CAPS (BENZONATATE) 1-2 by mouth  three times a day as needed  #60 x 2   Entered and Authorized by:   Corwin Levins MD   Signed by:   Corwin Levins MD on 05/09/2010   Method used:   Print then Give to Patient   RxID:   [id number]

## 2010-11-30 ENCOUNTER — Other Ambulatory Visit: Payer: Self-pay | Admitting: Dermatology

## 2011-01-21 ENCOUNTER — Encounter: Payer: Self-pay | Admitting: Internal Medicine

## 2011-01-21 DIAGNOSIS — Z0000 Encounter for general adult medical examination without abnormal findings: Secondary | ICD-10-CM | POA: Insufficient documentation

## 2011-01-22 ENCOUNTER — Encounter: Payer: Self-pay | Admitting: Internal Medicine

## 2011-01-22 ENCOUNTER — Ambulatory Visit (INDEPENDENT_AMBULATORY_CARE_PROVIDER_SITE_OTHER): Payer: Medicare Other | Admitting: Internal Medicine

## 2011-01-22 VITALS — BP 112/68 | HR 70 | Temp 98.1°F | Ht 65.0 in | Wt 145.2 lb

## 2011-01-22 DIAGNOSIS — F329 Major depressive disorder, single episode, unspecified: Secondary | ICD-10-CM

## 2011-01-22 DIAGNOSIS — F411 Generalized anxiety disorder: Secondary | ICD-10-CM

## 2011-01-22 DIAGNOSIS — M5432 Sciatica, left side: Secondary | ICD-10-CM

## 2011-01-22 DIAGNOSIS — M5430 Sciatica, unspecified side: Secondary | ICD-10-CM

## 2011-01-22 HISTORY — DX: Sciatica, left side: M54.32

## 2011-01-22 MED ORDER — PREDNISONE 10 MG PO TABS: 10.0000 mg | ORAL_TABLET | Freq: Every day | ORAL | Status: AC

## 2011-01-22 MED ORDER — METHYLPREDNISOLONE ACETATE 80 MG/ML IJ SUSP
120.0000 mg | Freq: Once | INTRAMUSCULAR | Status: AC
  Administered 2011-01-22: 120 mg via INTRAMUSCULAR

## 2011-01-22 MED ORDER — CYCLOBENZAPRINE HCL 5 MG PO TABS: 5.0000 mg | ORAL_TABLET | Freq: Three times a day (TID) | ORAL | Status: AC | PRN

## 2011-01-22 NOTE — Progress Notes (Signed)
Subjective:    Patient ID: Robyn Bailey, female    DOB: 25-May-1941, 70 y.o.   MRN: [id number]  HPI  Pt here with left LBP without change in severity, bowel or bladder change, fever, wt loss,  worsening LE pain/numbness/weakness, gait change or falls, but with some radiatioin to the left lateral hip and leg to the knee; overall moderate to severe for 3 days, started after lifting a heavy box, now worse to try to stand up straight. Better to lie on the side, states last episode similar apprxo 5 yrs ago tx here with predpack and did well.  No prior hx of lumbar surgury, PT, surgical eval or chronic pain.  No recent MRI or other imaging.  Pt denies chest pain, increased sob or doe, wheezing, orthopnea, PND, increased LE swelling, palpitations, dizziness or syncope.  Pt denies new neurological symptoms such as new headache, or facial or extremity weakness or numbness   Pt denies polydipsia, polyuria.  Denies worsening depressive symptoms, suicidal ideation, or panic, though has ongoing anxiety, not increased recently.   Past Medical History  Diagnosis Date  . HYPERLIPIDEMIA 07/19/2007  . ANXIETY 07/19/2007  . DEPRESSION 07/19/2007  . Blepharospasm 06/06/2008  . PERIPHERAL NEUROPATHY 06/08/2007  . Acute angle-closure glaucoma 04/20/2009  . SINUSITIS- ACUTE-NOS 11/04/2007  . SORE THROAT 02/27/2009  . RHINITIS 02/27/2009  . ALLERGIC RHINITIS 07/19/2007  . DIVERTICULOSIS, COLON 07/19/2007  . OSTEOARTHRITIS 07/19/2007  . LOW BACK PAIN 07/19/2007  . BACK PAIN 11/08/2009  . OSTEOPOROSIS 07/19/2007  . FATIGUE 04/20/2009  . Cough 09/22/2007  . ABNORMAL ELECTROCARDIOGRAM 07/19/2007  . MACULAR DEGENERATION, HX OF 07/19/2007  . COLONIC POLYPS, HX OF 07/19/2007  . Left sided sciatica 01/22/2011   Past Surgical History  Procedure Date  . Tonsillectomy   . Hammer toe surgury 2003    bilateral  . Breast biopsy     reports that she has never smoked. She does not have any smokeless tobacco history on file. She reports that  she drinks alcohol. She reports that she does not use illicit drugs. family history includes Cancer in her mother. Allergies  Allergen Reactions  . Hydrocodone-Acetaminophen    Current Outpatient Prescriptions on File Prior to Visit  Medication Sig Dispense Refill  . Cholecalciferol (VITAMIN D3) 1000 UNITS CAPS Take by mouth daily.        Marland Kitchen aspirin 81 MG tablet Take 81 mg by mouth daily.        Marland Kitchen DISCONTD: benzonatate (TESSALON) 100 MG capsule Take 100 mg by mouth 3 (three) times daily as needed.        Marland Kitchen DISCONTD: dexlansoprazole (DEXILANT) 60 MG capsule Take 60 mg by mouth 2 (two) times daily at 10 AM and 5 PM.        . DISCONTD: meloxicam (MOBIC) 15 MG tablet Take 15 mg by mouth daily.        Marland Kitchen DISCONTD: pravastatin (PRAVACHOL) 20 MG tablet Take 20 mg by mouth daily.         No current facility-administered medications on file prior to visit.    Review of Systems All otherwise neg per pt     Objective:   Physical Exam BP 112/68  Pulse 70  Temp(Src) 98.1 F (36.7 C) (Oral)  Ht 5\' 5"  (1.651 m)  Wt 145 lb 4 oz (65.885 kg)  BMI 24.17 kg/m2  SpO2 98% Physical Exam  VS noted Constitutional: Pt appears well-developed and well-nourished.  HENT: Head: Normocephalic.  Right Ear: External ear normal.  Left Ear: External ear normal.  Eyes: Conjunctivae and EOM are normal. Pupils are equal, round, and reactive to light.  Neck: Normal range of motion. Neck supple.  Cardiovascular: Normal rate and regular rhythm.   Pulmonary/Chest: Effort normal and breath sounds normal.  Abd:  Soft, NT, non-distended, + BS Neurological: Pt is alert. No cranial nerve deficit. LE motor/sens/dtr's intact, neg SLR bilat\ Spein nontender ,a s well as nontender lateral hip and leg, no rash or swelling Skin: Skin is warm. No erythema.  Psychiatric: Pt behavior is normal. Thought content normal.         Assessment & Plan:

## 2011-01-22 NOTE — Assessment & Plan Note (Signed)
stable overall by hx and exam, most recent lab reviewed with pt, and pt to continue medical treatment as before; verifed pt without suicidal ideation,  to f/u any worsening symptoms or concerns

## 2011-01-22 NOTE — Assessment & Plan Note (Signed)
stable overall by hx and exam, most recent lab reviewed with pt, and pt to continue medical treatment as before  Lab Results  Component Value Date   WBC 4.5 05/09/2010   HGB 14.2 05/09/2010   HCT 41.2 05/09/2010   PLT 192.0 05/09/2010   CHOL 223* 05/09/2010   TRIG 116.0 05/09/2010   HDL 79.00 05/09/2010   LDLDIRECT 127.4 05/09/2010   ALT 18 05/09/2010   AST 18 05/09/2010   NA 142 05/09/2010   K 4.5 05/09/2010   CL 104 05/09/2010   CREATININE 0.7 05/09/2010   BUN 26* 05/09/2010   CO2 31 05/09/2010   TSH 0.86 05/09/2010

## 2011-01-22 NOTE — Patient Instructions (Signed)
You had the steroid shot today Take all new medications as prescribed Continue all other medications as before Please call in 5 days (or less if obviously much worse) to consider MRI

## 2011-01-22 NOTE — Assessment & Plan Note (Signed)
Last episode x 5 yrs, with severe recurrence x 4 days but similar symptoms, exam benign for acute radiculopathy;  Will try predpack, and flexeril though has only mild MSK spasm by exam at best;  Declines pain med otherwise,  Using mother's walker and should be ok to drive home;  Would consider MRI if not better 3-5 days

## 2011-02-01 ENCOUNTER — Telehealth: Payer: Self-pay | Admitting: *Deleted

## 2011-02-01 DIAGNOSIS — M5416 Radiculopathy, lumbar region: Secondary | ICD-10-CM | POA: Insufficient documentation

## 2011-02-01 NOTE — Telephone Encounter (Signed)
Done per emr 

## 2011-02-01 NOTE — Telephone Encounter (Signed)
Pt states that at last OV you discussed setting up a MRI if back no better. She would like to move forward w/referrral at this time.

## 2011-02-01 NOTE — Assessment & Plan Note (Signed)
Per phone, pt pain not improved with conservative tx;  For MRI LS spine

## 2011-02-01 NOTE — Telephone Encounter (Signed)
Pt informed. [Appt has been scheduled for next Friday]

## 2011-02-08 ENCOUNTER — Ambulatory Visit (HOSPITAL_COMMUNITY)
Admission: RE | Admit: 2011-02-08 | Discharge: 2011-02-08 | Disposition: A | Discharge status: Home / Self Care | Payer: Medicare Other | Source: Ambulatory Visit | Attending: Internal Medicine | Admitting: Internal Medicine

## 2011-02-08 DIAGNOSIS — M47817 Spondylosis without myelopathy or radiculopathy, lumbosacral region: Secondary | ICD-10-CM | POA: Insufficient documentation

## 2011-02-08 DIAGNOSIS — IMO0002 Reserved for concepts with insufficient information to code with codable children: Secondary | ICD-10-CM | POA: Insufficient documentation

## 2011-02-08 DIAGNOSIS — G8929 Other chronic pain: Secondary | ICD-10-CM | POA: Insufficient documentation

## 2011-02-08 DIAGNOSIS — M5416 Radiculopathy, lumbar region: Secondary | ICD-10-CM

## 2011-02-08 DIAGNOSIS — M4804 Spinal stenosis, thoracic region: Secondary | ICD-10-CM | POA: Insufficient documentation

## 2011-02-08 DIAGNOSIS — M48061 Spinal stenosis, lumbar region without neurogenic claudication: Secondary | ICD-10-CM | POA: Insufficient documentation

## 2011-02-08 DIAGNOSIS — M5126 Other intervertebral disc displacement, lumbar region: Secondary | ICD-10-CM | POA: Insufficient documentation

## 2011-02-12 ENCOUNTER — Telehealth: Payer: Self-pay | Admitting: *Deleted

## 2011-02-12 ENCOUNTER — Other Ambulatory Visit: Payer: Self-pay | Admitting: Internal Medicine

## 2011-02-12 DIAGNOSIS — M5432 Sciatica, left side: Secondary | ICD-10-CM

## 2011-02-12 NOTE — Telephone Encounter (Signed)
Already addressed at about noon today

## 2011-02-12 NOTE — Telephone Encounter (Signed)
Patient requesting a call with results of MRI as soon as avail.

## 2011-02-12 NOTE — Assessment & Plan Note (Signed)
MRI reviewed  - to refer to NS

## 2011-02-15 ENCOUNTER — Telehealth: Payer: Self-pay

## 2011-02-15 DIAGNOSIS — M549 Dorsalgia, unspecified: Secondary | ICD-10-CM

## 2011-02-15 NOTE — Telephone Encounter (Signed)
Please clarify - I think she means a neurosurgeon?  (and please let me know name of one she saw already so we dont try to refer again)

## 2011-02-15 NOTE — Telephone Encounter (Signed)
I can refer to [facility], which does all other non-invasive type therapy, such as PT, massage, accupuncture, nutrition counseling, biofeedback, yoga and counseling  Let me know if she wants to try this first, I think would be good for her

## 2011-02-15 NOTE — Telephone Encounter (Signed)
Called the patient and she does want a referral to integrative therapy asap.

## 2011-02-15 NOTE — Telephone Encounter (Signed)
Pt called stating she was seen by Neurologist and was not satisfied with advisement. Pt wants a second opinion and is requesting another referral.

## 2011-02-15 NOTE — Telephone Encounter (Signed)
Referral done per emr 

## 2011-02-15 NOTE — Telephone Encounter (Signed)
Called the patient and she had seen Dr. Sandria Manly previously, does not want referral. She saw Dr. Gerlene Fee Friday and was not pleased with him either. Does the patient have other alternatives other than surgury or injections, stated she is feeling better.

## 2011-02-28 ENCOUNTER — Ambulatory Visit: Payer: Medicare Other | Attending: Internal Medicine | Admitting: Physical Therapy

## 2011-02-28 DIAGNOSIS — IMO0001 Reserved for inherently not codable concepts without codable children: Secondary | ICD-10-CM | POA: Insufficient documentation

## 2011-02-28 DIAGNOSIS — M2569 Stiffness of other specified joint, not elsewhere classified: Secondary | ICD-10-CM | POA: Insufficient documentation

## 2011-02-28 DIAGNOSIS — M545 Low back pain, unspecified: Secondary | ICD-10-CM | POA: Insufficient documentation

## 2011-03-06 ENCOUNTER — Ambulatory Visit: Payer: Medicare Other | Admitting: Physical Therapy

## 2011-03-06 ENCOUNTER — Telehealth: Payer: Self-pay

## 2011-03-06 NOTE — Telephone Encounter (Signed)
Ok in this case, to hold if has pain

## 2011-03-06 NOTE — Telephone Encounter (Signed)
Charlene Brooke PT [phone]) called to request advise for this patient. Due to MRI findings L5 S1 spondylolifthefis, is it safe to do [place] extensions. Please advise.

## 2011-03-07 NOTE — Telephone Encounter (Signed)
Called Robyn Bailey informed of MD's instructions.

## 2011-03-08 ENCOUNTER — Ambulatory Visit: Payer: Medicare Other | Admitting: Physical Therapy

## 2011-03-11 ENCOUNTER — Ambulatory Visit: Payer: Medicare Other

## 2011-03-15 ENCOUNTER — Ambulatory Visit: Payer: Medicare Other | Admitting: Physical Therapy

## 2011-03-18 ENCOUNTER — Ambulatory Visit: Payer: Medicare Other | Attending: Internal Medicine | Admitting: Physical Therapy

## 2011-03-18 DIAGNOSIS — IMO0001 Reserved for inherently not codable concepts without codable children: Secondary | ICD-10-CM | POA: Insufficient documentation

## 2011-03-18 DIAGNOSIS — M545 Low back pain, unspecified: Secondary | ICD-10-CM | POA: Insufficient documentation

## 2011-03-18 DIAGNOSIS — M2569 Stiffness of other specified joint, not elsewhere classified: Secondary | ICD-10-CM | POA: Insufficient documentation

## 2011-03-21 ENCOUNTER — Encounter: Payer: Medicare Other | Admitting: Physical Therapy

## 2011-03-25 ENCOUNTER — Ambulatory Visit: Payer: Medicare Other | Admitting: Physical Therapy

## 2011-03-27 ENCOUNTER — Encounter: Payer: Medicare Other | Admitting: Physical Therapy

## 2011-03-29 ENCOUNTER — Ambulatory Visit: Payer: Medicare Other | Admitting: Physical Therapy

## 2011-04-03 ENCOUNTER — Ambulatory Visit: Payer: Medicare Other | Admitting: Physical Therapy

## 2011-04-05 ENCOUNTER — Ambulatory Visit: Payer: Medicare Other | Admitting: Physical Therapy

## 2011-04-10 ENCOUNTER — Ambulatory Visit: Payer: Medicare Other | Admitting: Physical Therapy

## 2011-04-12 ENCOUNTER — Encounter: Payer: Medicare Other | Admitting: Physical Therapy

## 2011-04-17 ENCOUNTER — Encounter: Payer: Medicare Other | Admitting: Physical Therapy

## 2011-04-19 ENCOUNTER — Encounter: Payer: Medicare Other | Admitting: Physical Therapy

## 2011-05-12 ENCOUNTER — Other Ambulatory Visit: Payer: Self-pay | Admitting: Internal Medicine

## 2011-05-16 ENCOUNTER — Encounter: Payer: Self-pay | Admitting: Internal Medicine

## 2011-05-16 ENCOUNTER — Ambulatory Visit (INDEPENDENT_AMBULATORY_CARE_PROVIDER_SITE_OTHER): Payer: Medicare Other | Admitting: Internal Medicine

## 2011-05-16 DIAGNOSIS — Z0000 Encounter for general adult medical examination without abnormal findings: Secondary | ICD-10-CM

## 2011-05-16 DIAGNOSIS — F411 Generalized anxiety disorder: Secondary | ICD-10-CM

## 2011-05-16 DIAGNOSIS — J019 Acute sinusitis, unspecified: Secondary | ICD-10-CM | POA: Insufficient documentation

## 2011-05-16 DIAGNOSIS — J309 Allergic rhinitis, unspecified: Secondary | ICD-10-CM

## 2011-05-16 MED ORDER — METHYLPREDNISOLONE ACETATE 80 MG/ML IJ SUSP
120.0000 mg | Freq: Once | INTRAMUSCULAR | Status: AC
  Administered 2011-05-16: 120 mg via INTRAMUSCULAR

## 2011-05-16 MED ORDER — PREDNISONE 10 MG PO TABS: ORAL_TABLET | ORAL | Status: DC

## 2011-05-16 MED ORDER — LEVOFLOXACIN 250 MG PO TABS: 250.0000 mg | ORAL_TABLET | Freq: Every day | ORAL | Status: AC

## 2011-05-16 NOTE — Assessment & Plan Note (Signed)
Mild to mod, for antibx course,  to f/u any worsening symptoms or concerns 

## 2011-05-16 NOTE — Assessment & Plan Note (Signed)
stable overall by hx and exam, most recent data reviewed with pt, and pt to continue medical treatment as before  Lab Results  Component Value Date   WBC 4.5 05/09/2010   HGB 14.2 05/09/2010   HCT 41.2 05/09/2010   PLT 192.0 05/09/2010   CHOL 223* 05/09/2010   TRIG 116.0 05/09/2010   HDL 79.00 05/09/2010   LDLDIRECT 127.4 05/09/2010   ALT 18 05/09/2010   AST 18 05/09/2010   NA 142 05/09/2010   K 4.5 05/09/2010   CL 104 05/09/2010   CREATININE 0.7 05/09/2010   BUN 26* 05/09/2010   CO2 31 05/09/2010   TSH 0.86 05/09/2010

## 2011-05-16 NOTE — Assessment & Plan Note (Signed)
Mild to mod, for depomedrol IM, for allegra or zyrtec otc,  to f/u any worsening symptoms or concerns, consider flonase and /or repeat allergy eval

## 2011-05-16 NOTE — Progress Notes (Signed)
Subjective:    Patient ID: Robyn Bailey, female    DOB: 1941-06-22, 70 y.o.   MRN: [id number]  HPI   Here with 3 days acute onset fever, facial pain, pressure, general weakness and malaise, and greenish d/c, with slight ST, but little to no cough and Pt denies chest pain, increased sob or doe, wheezing, orthopnea, PND, increased LE swelling, palpitations, dizziness or syncope.  Does have several wks ongoing nasal allergy symptoms with clear congestion, itch and sneeze, without fever, pain, ST, cough or wheezing.  Denies worsening depressive symptoms, suicidal ideation, or panic, though has ongoing anxiety, not increased recently.  Pt denies new neurological symptoms such as new headache, or facial or extremity weakness or numbness   Pt denies polydipsia, polyuria,. Past Medical History  Diagnosis Date  . HYPERLIPIDEMIA 07/19/2007  . ANXIETY 07/19/2007  . DEPRESSION 07/19/2007  . Blepharospasm 06/06/2008  . PERIPHERAL NEUROPATHY 06/08/2007  . Acute angle-closure glaucoma 04/20/2009  . SINUSITIS- ACUTE-NOS 11/04/2007  . SORE THROAT 02/27/2009  . RHINITIS 02/27/2009  . ALLERGIC RHINITIS 07/19/2007  . DIVERTICULOSIS, COLON 07/19/2007  . OSTEOARTHRITIS 07/19/2007  . LOW BACK PAIN 07/19/2007  . BACK PAIN 11/08/2009  . OSTEOPOROSIS 07/19/2007  . FATIGUE 04/20/2009  . Cough 09/22/2007  . ABNORMAL ELECTROCARDIOGRAM 07/19/2007  . MACULAR DEGENERATION, HX OF 07/19/2007  . COLONIC POLYPS, HX OF 07/19/2007  . Left sided sciatica 01/22/2011   Past Surgical History  Procedure Date  . Tonsillectomy   . Hammer toe surgury 2003    bilateral  . Breast biopsy     reports that she has never smoked. She does not have any smokeless tobacco history on file. She reports that she drinks alcohol. She reports that she does not use illicit drugs. family history includes Cancer in her mother. Allergies  Allergen Reactions  . Hydrocodone-Acetaminophen    Current Outpatient Prescriptions on File Prior to Visit  Medication Sig  Dispense Refill  . aspirin 81 MG tablet Take 81 mg by mouth daily.        . Cholecalciferol (VITAMIN D3) 1000 UNITS CAPS Take by mouth daily.        . cyclobenzaprine (FLEXERIL) 5 MG tablet Take 1 tablet (5 mg total) by mouth 3 (three) times daily as needed for muscle spasms.  60 tablet  1  . meloxicam (MOBIC) 15 MG tablet TAKE 1 TABLET BY MOUTH ONCE DAILY IF NEEDED  90 tablet  3   No current facility-administered medications on file prior to visit.   Review of Systems Review of Systems  Constitutional: Negative for diaphoresis and unexpected weight change.  HENT: Negative for drooling and tinnitus.   Eyes: Negative for photophobia and visual disturbance.  Respiratory: Negative for choking and stridor.   Gastrointestinal: Negative for vomiting and blood in stool.  Genitourinary: Negative for hematuria and decreased urine volume.    Objective:   Physical Exam BP 94/62  Pulse 71  Temp(Src) 98.3 F (36.8 C) (Oral)  Ht 5' 5.5" (1.664 m)  Wt 148 lb 6 oz (67.302 kg)  BMI 24.32 kg/m2  SpO2 98% Physical Exam  VS noted, mild ill appearing Constitutional: Pt appears well-developed and well-nourished.  HENT: Head: Normocephalic.  Right Ear: External ear normal.  Left Ear: External ear normal.  Bilat tm's mild erythema.  Sinus tender.  Pharynx mild erythema Eyes: Conjunctivae and EOM are normal. Pupils are equal, round, and reactive to light.  Neck: Normal range of motion. Neck supple.  Cardiovascular: Normal rate and regular rhythm.  Pulmonary/Chest: Effort normal and breath sounds normal.  Neurological: Pt is alert. No cranial nerve deficit.  Skin: Skin is warm. No erythema.  Psychiatric: Pt behavior is normal. Thought content normal. 1+ nervous        Assessment & Plan:

## 2011-05-16 NOTE — Patient Instructions (Addendum)
You had the steroid shot today Take all new medications as prescribed - the antibiotic, and the prednisone You can also take Delsym OTC for cough, and/or Mucinex (or it's generic off brand) for congestion You can also take Zyrtec OTC or Allegra OTC as well for the allergies, especially if you are around the cat Continue all other medications as before Please return in 3 months with Lab testing done 3-5 days before

## 2012-07-14 ENCOUNTER — Other Ambulatory Visit: Payer: Self-pay

## 2012-07-14 MED ORDER — MELOXICAM 15 MG PO TABS: 15.0000 mg | ORAL_TABLET | Freq: Every day | ORAL | Status: DC

## 2012-08-31 ENCOUNTER — Other Ambulatory Visit: Payer: Self-pay | Admitting: Dermatology

## 2012-09-08 ENCOUNTER — Encounter: Payer: Self-pay | Admitting: Internal Medicine

## 2012-09-08 ENCOUNTER — Ambulatory Visit (INDEPENDENT_AMBULATORY_CARE_PROVIDER_SITE_OTHER): Payer: Medicare Other | Admitting: Internal Medicine

## 2012-09-08 VITALS — BP 102/72 | HR 64 | Temp 98.0°F | Ht 65.5 in | Wt 150.2 lb

## 2012-09-08 DIAGNOSIS — J019 Acute sinusitis, unspecified: Secondary | ICD-10-CM

## 2012-09-08 DIAGNOSIS — F411 Generalized anxiety disorder: Secondary | ICD-10-CM

## 2012-09-08 MED ORDER — HYDROCODONE-HOMATROPINE 5-1.5 MG/5ML PO SYRP: 5.0000 mL | ORAL_SOLUTION | Freq: Four times a day (QID) | ORAL | Status: DC | PRN

## 2012-09-08 MED ORDER — LEVOFLOXACIN 250 MG PO TABS: 250.0000 mg | ORAL_TABLET | Freq: Every day | ORAL | Status: DC

## 2012-09-08 NOTE — Assessment & Plan Note (Signed)
Mild to mod, for antibx course,  to f/u any worsening symptoms or concerns 

## 2012-09-08 NOTE — Patient Instructions (Addendum)
Take all new medications as prescribed Continue all other medications as before Please continue your efforts at being more active, low cholesterol diet, and weight control. Thank you for enrolling in MyChart. Please follow the instructions below to securely access your online medical record. MyChart allows you to send messages to your doctor, view your test results, renew your prescriptions, schedule appointments, and more. To Log into MyChart, please go to [email or web address], and your Username is: Robyn Bailey

## 2012-09-08 NOTE — Progress Notes (Signed)
  Subjective:    Patient ID: Robyn Bailey, female    DOB: 1941-07-16, 71 y.o.   MRN: [id number]  HPI   Here with 2 wks acute onset fever, facial pain, pressure, general weakness and malaise, and greenish d/c, with slight ST, but little to no cough and Pt denies chest pain, increased sob or doe, wheezing, orthopnea, PND, increased LE swelling, palpitations, dizziness or syncope.Not better with mucinex, delsym, and zyrtec.  Whole situations makes her very nervous.  [facility] [employer], makes her feel better.  Pt denies new neurological symptoms such as new headache, or facial or extremity weakness or numbness   Pt denies polydipsia, polyuria Past Medical History  Diagnosis Date  . HYPERLIPIDEMIA 07/19/2007  . ANXIETY 07/19/2007  . DEPRESSION 07/19/2007  . Blepharospasm 06/06/2008  . PERIPHERAL NEUROPATHY 06/08/2007  . Acute angle-closure glaucoma 04/20/2009  . SINUSITIS- ACUTE-NOS 11/04/2007  . SORE THROAT 02/27/2009  . RHINITIS 02/27/2009  . ALLERGIC RHINITIS 07/19/2007  . DIVERTICULOSIS, COLON 07/19/2007  . OSTEOARTHRITIS 07/19/2007  . LOW BACK PAIN 07/19/2007  . BACK PAIN 11/08/2009  . OSTEOPOROSIS 07/19/2007  . FATIGUE 04/20/2009  . Cough 09/22/2007  . ABNORMAL ELECTROCARDIOGRAM 07/19/2007  . MACULAR DEGENERATION, HX OF 07/19/2007  . COLONIC POLYPS, HX OF 07/19/2007  . Left sided sciatica 01/22/2011   Past Surgical History  Procedure Date  . Tonsillectomy   . Hammer toe surgury 2003    bilateral  . Breast biopsy     reports that she has never smoked. She does not have any smokeless tobacco history on file. She reports that she drinks alcohol. She reports that she does not use illicit drugs. family history includes Cancer in her mother. Allergies  Allergen Reactions  . Hydrocodone-Acetaminophen    Current Outpatient Prescriptions on File Prior to Visit  Medication Sig Dispense Refill  . aspirin 81 MG tablet Take 81 mg by mouth daily.        . Cholecalciferol (VITAMIN D3) 1000 UNITS CAPS  Take by mouth daily.        . meloxicam (MOBIC) 15 MG tablet Take 1 tablet (15 mg total) by mouth daily.  90 tablet  0   Review of Systems  Constitutional: Negative for diaphoresis and unexpected weight change.  HENT: Negative for tinnitus.   Eyes: Negative for photophobia and visual disturbance.  Respiratory: Negative for choking and stridor.   Gastrointestinal: Negative for vomiting and blood in stool.  Genitourinary: Negative for hematuria and decreased urine volume.  Musculoskeletal: Negative for gait problem.  Skin: Negative for color change and wound.  Neurological: Negative for tremors and numbness.  Psychiatric/Behavioral: Negative for decreased concentration. The patient is not hyperactive.       Objective:   Physical Exam  Physical Exam  VS noted, mild ill Constitutional: Pt appears well-developed and well-nourished.  HENT: Head: Normocephalic.  Right Ear: External ear normal.  Left Ear: External ear normal.  Bilat tm's mild erythema.  Sinus nontender.  Pharynx mild erythema Eyes: Conjunctivae and EOM are normal. Pupils are equal, round, and reactive to light.  Neck: Normal range of motion. Neck supple.  Cardiovascular: Normal rate and regular rhythm.   Pulmonary/Chest: Effort normal and breath sounds normal.  Neurological: Pt is alert. Not confused  Skin: Skin is warm. No erythema.  Psychiatric: Pt behavior is normal. Thought content normal.        Assessment & Plan:

## 2012-09-08 NOTE — Assessment & Plan Note (Signed)
Reassured, overall mild increased, cont same meds,  to f/u any worsening symptoms or concerns

## 2012-09-28 ENCOUNTER — Encounter: Payer: Self-pay | Admitting: Internal Medicine

## 2012-09-28 ENCOUNTER — Other Ambulatory Visit (INDEPENDENT_AMBULATORY_CARE_PROVIDER_SITE_OTHER): Payer: Medicare Other

## 2012-09-28 ENCOUNTER — Ambulatory Visit (INDEPENDENT_AMBULATORY_CARE_PROVIDER_SITE_OTHER): Payer: Medicare Other | Admitting: Internal Medicine

## 2012-09-28 VITALS — BP 120/80 | HR 67 | Temp 97.6°F | Ht 65.5 in | Wt 152.3 lb

## 2012-09-28 DIAGNOSIS — E785 Hyperlipidemia, unspecified: Secondary | ICD-10-CM

## 2012-09-28 DIAGNOSIS — Z0000 Encounter for general adult medical examination without abnormal findings: Secondary | ICD-10-CM

## 2012-09-28 DIAGNOSIS — K219 Gastro-esophageal reflux disease without esophagitis: Secondary | ICD-10-CM

## 2012-09-28 DIAGNOSIS — J309 Allergic rhinitis, unspecified: Secondary | ICD-10-CM

## 2012-09-28 LAB — URINALYSIS, ROUTINE W REFLEX MICROSCOPIC
Specific Gravity, Urine: 1.015 (ref 1.000–1.030)
Total Protein, Urine: NEGATIVE
Urine Glucose: NEGATIVE
Urobilinogen, UA: 0.2 (ref 0.0–1.0)
pH: 6 (ref 5.0–8.0)

## 2012-09-28 LAB — BASIC METABOLIC PANEL
Calcium: 9.5 mg/dL (ref 8.4–10.5)
GFR: 86.08 mL/min (ref >60.00)
Potassium: 4.3 mEq/L (ref 3.5–5.1)
Sodium: 141 mEq/L (ref 135–145)

## 2012-09-28 LAB — CBC WITH DIFFERENTIAL/PLATELET
Basophils Absolute: 0 10*3/uL (ref 0.0–0.1)
Eosinophils Absolute: 0.2 10*3/uL (ref 0.0–0.7)
HCT: 43.5 % (ref 36.0–46.0)
Lymphs Abs: 2 10*3/uL (ref 0.7–4.0)
MCV: 82.3 fl (ref 78.0–100.0)
Monocytes Absolute: 0.5 10*3/uL (ref 0.1–1.0)
Neutrophils Relative %: 48.5 % (ref 43.0–77.0)
Platelets: 216 10*3/uL (ref 150.0–400.0)
RDW: 12.9 % (ref 11.5–14.6)

## 2012-09-28 LAB — HEPATIC FUNCTION PANEL
Albumin: 3.7 g/dL (ref 3.5–5.2)
Alkaline Phosphatase: 63 U/L (ref 39–117)
Bilirubin, Direct: 0.1 mg/dL (ref 0.0–0.3)
Total Bilirubin: 0.8 mg/dL (ref 0.3–1.2)

## 2012-09-28 LAB — LIPID PANEL
Cholesterol: 208 mg/dL — ABNORMAL HIGH (ref 0–200)
HDL: 65.7 mg/dL (ref >39.00)

## 2012-09-28 MED ORDER — FEXOFENADINE HCL 180 MG PO TABS: 180.0000 mg | ORAL_TABLET | Freq: Every day | ORAL | Status: DC

## 2012-09-28 MED ORDER — FLUTICASONE PROPIONATE 50 MCG/ACT NA SUSP: 2.0000 | Freq: Every day | NASAL | Status: DC

## 2012-09-28 MED ORDER — PANTOPRAZOLE SODIUM 40 MG PO TBEC: 40.0000 mg | DELAYED_RELEASE_TABLET | Freq: Every day | ORAL | Status: DC

## 2012-09-28 NOTE — Patient Instructions (Addendum)
Your EKG was ok today You are given the most recent lab work from 2011 Please take all new medication as prescribed - the allegra, flonase, and protonix Please continue all other medications as before, and refills have been done if requested. Please have the pharmacy call with any other refills you may need. Please continue your efforts at being more active, low cholesterol diet, and weight control Please call if you find out the shingles shot is covered by your insurance You are otherwise up to date with prevention measures today. Please go to the LAB in the Basement (turn left off the elevator) for the tests to be done today You will be contacted by phone if any changes need to be made immediately.  Otherwise, you will receive a letter about your results with an explanation, but please check with MyChart first. Thank you for enrolling in MyChart. Please follow the instructions below to securely access your online medical record. MyChart allows you to send messages to your doctor, view your test results, renew your prescriptions, schedule appointments, and more. Please return in 1 year for your yearly visit, or sooner if needed, with Lab testing done 3-5 days before

## 2012-09-28 NOTE — Assessment & Plan Note (Signed)
Ok for protonix asd,  to f/u any worsening symptoms or concerns 

## 2012-09-28 NOTE — Assessment & Plan Note (Signed)
stable overall by history and exam, recent data reviewed with pt, and pt to continue medical treatment as before,  to f/u any worsening symptoms or concerns, ECG reviewed as per emr Lab Results  Component Value Date   LDLCALC 96 07/24/2006   Cont lower chol diet

## 2012-09-28 NOTE — Assessment & Plan Note (Signed)

## 2012-09-28 NOTE — Assessment & Plan Note (Signed)
Mild to mod - for allegra/flonase asd,  to f/u any worsening symptoms or concerns

## 2012-09-28 NOTE — Progress Notes (Signed)
Subjective:    Patient ID: Robyn Bailey, female    DOB: 1941-08-22, 72 y.o.   MRN: [id number]  HPI   Here for wellness and f/u;  Overall doing ok;  Pt denies CP, worsening SOB, DOE, wheezing, orthopnea, PND, worsening LE edema, palpitations, dizziness or syncope.  Pt denies neurological change such as new headache, facial or extremity weakness.  Pt denies polydipsia, polyuria, or low sugar symptoms. Pt states overall good compliance with treatment and medications, good tolerability, and has been trying to follow lower cholesterol diet.  Pt denies worsening depressive symptoms, suicidal ideation or panic. No fever, night sweats, wt loss, loss of appetite, or other constitutional symptoms.  Pt states good ability with ADL's, has low fall risk, home safety reviewed and adequate, no other significant changes in hearing or vision, and only occasionally active with exercise.  Does have several wks ongoing nasal allergy symptoms with clearish congestion, itch and sneezing, without fever, pain, ST, cough, swelling or wheezing.  Has also had worsening reflux but previously had diarrhea with prilosec so has not taken in last 6 mo or more, but no other abd pain, dysphagia, n/v, bowel change or blood. Past Medical History  Diagnosis Date  . HYPERLIPIDEMIA 07/19/2007  . ANXIETY 07/19/2007  . DEPRESSION 07/19/2007  . Blepharospasm 06/06/2008  . PERIPHERAL NEUROPATHY 06/08/2007  . Acute angle-closure glaucoma 04/20/2009  . SINUSITIS- ACUTE-NOS 11/04/2007  . SORE THROAT 02/27/2009  . RHINITIS 02/27/2009  . ALLERGIC RHINITIS 07/19/2007  . DIVERTICULOSIS, COLON 07/19/2007  . OSTEOARTHRITIS 07/19/2007  . LOW BACK PAIN 07/19/2007  . BACK PAIN 11/08/2009  . OSTEOPOROSIS 07/19/2007  . FATIGUE 04/20/2009  . Cough 09/22/2007  . ABNORMAL ELECTROCARDIOGRAM 07/19/2007  . MACULAR DEGENERATION, HX OF 07/19/2007  . COLONIC POLYPS, HX OF 07/19/2007  . Left sided sciatica 01/22/2011   Past Surgical History  Procedure Date  . Tonsillectomy     . Hammer toe surgury 2003    bilateral  . Breast biopsy     reports that she has never smoked. She does not have any smokeless tobacco history on file. She reports that she drinks alcohol. She reports that she does not use illicit drugs. family history includes Cancer in her mother. Allergies  Allergen Reactions  . Hydrocodone-Acetaminophen    Current Outpatient Prescriptions on File Prior to Visit  Medication Sig Dispense Refill  . aspirin 81 MG tablet Take 81 mg by mouth daily.        . Cholecalciferol (VITAMIN D3) 1000 UNITS CAPS Take by mouth daily.        . meloxicam (MOBIC) 15 MG tablet Take 1 tablet (15 mg total) by mouth daily.  90 tablet  0  . fexofenadine (ALLEGRA) 180 MG tablet Take 1 tablet (180 mg total) by mouth daily.  90 tablet  3  . fluticasone (FLONASE) 50 MCG/ACT nasal spray Place 2 sprays into the nose daily.  16 g  5  . pantoprazole (PROTONIX) 40 MG tablet Take 1 tablet (40 mg total) by mouth daily.  90 tablet  3   Review of Systems Constitutional: Negative for diaphoresis, activity change, appetite change or unexpected weight change.  HENT: Negative for hearing loss, ear pain, facial swelling, mouth sores and neck stiffness.   Eyes: Negative for pain, redness and visual disturbance.  Respiratory: Negative for shortness of breath and wheezing.   Cardiovascular: Negative for chest pain and palpitations.  Gastrointestinal: Negative for diarrhea, blood in stool, abdominal distention or other pain Genitourinary: Negative for hematuria, flank  pain or change in urine volume.  Musculoskeletal: Negative for myalgias and joint swelling.  Skin: Negative for color change and wound.  Neurological: Negative for syncope and numbness. other than noted Hematological: Negative for adenopathy.  Psychiatric/Behavioral: Negative for hallucinations, self-injury, decreased concentration and agitation.      Objective:   Physical Exam BP 120/80  Pulse 67  Temp 97.6 F (36.4 C)  (Oral)  Ht 5' 5.5" (1.664 m)  Wt 152 lb 5 oz (69.088 kg)  BMI 24.96 kg/m2  SpO2 99% VS noted, not ill appearing Constitutional: Pt is oriented to person, place, and time. Appears well-developed and well-nourished.  Head: Normocephalic and atraumatic.  Right Ear: External ear normal.  Left Ear: External ear normal.  Nose: Nose normal.  Mouth/Throat: Oropharynx is clear and moist. Bilat tm's with mild erythema.  Max sinus areas non tender.  Pharynx with mild erythema, no exudate  Eyes: Conjunctivae and EOM are normal. Pupils are equal, round, and reactive to light.  Neck: Normal range of motion. Neck supple. No JVD present. No tracheal deviation present.  Cardiovascular: Normal rate, regular rhythm, normal heart sounds and intact distal pulses.   Pulmonary/Chest: Effort normal and breath sounds normal.  Abdominal: Soft. Bowel sounds are normal. There is no tenderness. No HSM  Musculoskeletal: Normal range of motion. Exhibits no edema.  Lymphadenopathy:  Has no cervical adenopathy.  Neurological: Pt is alert and oriented to person, place, and time. Pt has normal reflexes. No cranial nerve deficit.  Skin: Skin is warm and dry. No rash noted.  Psychiatric:  Has  normal mood and affect. Behavior is normal. except 1+ nervous    Assessment & Plan:

## 2012-10-31 ENCOUNTER — Other Ambulatory Visit: Payer: Self-pay

## 2012-11-04 ENCOUNTER — Other Ambulatory Visit: Payer: Self-pay | Admitting: Obstetrics and Gynecology

## 2012-11-04 DIAGNOSIS — R928 Other abnormal and inconclusive findings on diagnostic imaging of breast: Secondary | ICD-10-CM

## 2012-12-31 ENCOUNTER — Ambulatory Visit
Admission: RE | Admit: 2012-12-31 | Discharge: 2012-12-31 | Disposition: A | Discharge status: Home / Self Care | Payer: Medicare Other | Source: Ambulatory Visit | Attending: Obstetrics and Gynecology | Admitting: Obstetrics and Gynecology

## 2012-12-31 DIAGNOSIS — R928 Other abnormal and inconclusive findings on diagnostic imaging of breast: Secondary | ICD-10-CM

## 2013-01-27 LAB — HM MAMMOGRAPHY

## 2013-04-22 ENCOUNTER — Ambulatory Visit (INDEPENDENT_AMBULATORY_CARE_PROVIDER_SITE_OTHER): Payer: Medicare Other | Admitting: Internal Medicine

## 2013-04-22 ENCOUNTER — Telehealth: Payer: Self-pay | Admitting: Internal Medicine

## 2013-04-22 ENCOUNTER — Encounter: Payer: Self-pay | Admitting: Internal Medicine

## 2013-04-22 DIAGNOSIS — T63481A Toxic effect of venom of other arthropod, accidental (unintentional), initial encounter: Secondary | ICD-10-CM | POA: Insufficient documentation

## 2013-04-22 DIAGNOSIS — R21 Rash and other nonspecific skin eruption: Secondary | ICD-10-CM | POA: Insufficient documentation

## 2013-04-22 DIAGNOSIS — F411 Generalized anxiety disorder: Secondary | ICD-10-CM

## 2013-04-22 DIAGNOSIS — T6391XA Toxic effect of contact with unspecified venomous animal, accidental (unintentional), initial encounter: Secondary | ICD-10-CM

## 2013-04-22 MED ORDER — METHYLPREDNISOLONE ACETATE 80 MG/ML IJ SUSP
80.0000 mg | Freq: Once | INTRAMUSCULAR | Status: AC
  Administered 2013-04-22: 80 mg via INTRAMUSCULAR

## 2013-04-22 MED ORDER — CEPHALEXIN 500 MG PO CAPS: 500.0000 mg | ORAL_CAPSULE | Freq: Four times a day (QID) | ORAL | Status: DC

## 2013-04-22 NOTE — Patient Instructions (Signed)
You had the steroid shot today Please take all new medication as prescribed - the antibiotic You can also use Benadryl OTC 50 mg every 6 hrs as needed for itch and swelling, as well as the topical benadryl cream for the worst areas Please continue all other medications as before, and refills have been done if requested. Please have the pharmacy call with any other refills you may need.  Please remember to sign up for My Chart if you have not done so, as this will be important to you in the future with finding out test results, communicating by private email, and scheduling acute appointments online when needed.

## 2013-04-22 NOTE — Assessment & Plan Note (Signed)
Also incidentally prob contact dermatitis to right lower back, for benadryl cream, benadryl 50 po q 6 hrs prn, and depomedrol IM today as this may help other sting sites symptomatically as well

## 2013-04-22 NOTE — Assessment & Plan Note (Signed)
With possible cellulitis starting left hand/wrist - for cephalexin asd,  to f/u any worsening symptoms or concerns

## 2013-04-22 NOTE — Assessment & Plan Note (Signed)
Some situational worsening, due to illness, declines need for further tx at this time

## 2013-04-22 NOTE — Progress Notes (Signed)
Subjective:    Patient ID: Robyn Bailey, female    DOB: Aug 18, 1941, 72 y.o.   MRN: [id number]  HPI Here after apparently stopped on wasp nest yesterday with numerous stings, she thinks at least 7, now with persistent ? Worsening pain to left hand site, as well as other itchy rash to lower back new onset as well, after working in the yard several hours, not sure if she came into contact with poison ivy.  No fever, red streaks, or chills. No lip or tongue swelling, no wheezing, CP, SOB.  Denies worsening depressive symptoms, suicidal ideation, or panic; has ongoing anxiety, not increased recently until the stings yesterday Past Medical History  Diagnosis Date  . HYPERLIPIDEMIA 07/19/2007  . ANXIETY 07/19/2007  . DEPRESSION 07/19/2007  . Blepharospasm 06/06/2008  . PERIPHERAL NEUROPATHY 06/08/2007  . Acute angle-closure glaucoma 04/20/2009  . SINUSITIS- ACUTE-NOS 11/04/2007  . SORE THROAT 02/27/2009  . RHINITIS 02/27/2009  . ALLERGIC RHINITIS 07/19/2007  . DIVERTICULOSIS, COLON 07/19/2007  . OSTEOARTHRITIS 07/19/2007  . LOW BACK PAIN 07/19/2007  . BACK PAIN 11/08/2009  . OSTEOPOROSIS 07/19/2007  . FATIGUE 04/20/2009  . Cough 09/22/2007  . ABNORMAL ELECTROCARDIOGRAM 07/19/2007  . MACULAR DEGENERATION, HX OF 07/19/2007  . COLONIC POLYPS, HX OF 07/19/2007  . Left sided sciatica 01/22/2011   Past Surgical History  Procedure Laterality Date  . Tonsillectomy    . Hammer toe surgury  2003    bilateral  . Breast biopsy      reports that she has never smoked. She does not have any smokeless tobacco history on file. She reports that  drinks alcohol. She reports that she does not use illicit drugs. family history includes Cancer in her mother. Allergies  Allergen Reactions  . Hydrocodone-Acetaminophen    Current Outpatient Prescriptions on File Prior to Visit  Medication Sig Dispense Refill  . aspirin 81 MG tablet Take 81 mg by mouth daily.        . Cholecalciferol (VITAMIN D3) 1000 UNITS CAPS Take by mouth  daily.        . fexofenadine (ALLEGRA) 180 MG tablet Take 1 tablet (180 mg total) by mouth daily.  90 tablet  3  . fluticasone (FLONASE) 50 MCG/ACT nasal spray Place 2 sprays into the nose daily.  16 g  5  . meloxicam (MOBIC) 15 MG tablet Take 1 tablet (15 mg total) by mouth daily.  90 tablet  0  . pantoprazole (PROTONIX) 40 MG tablet Take 1 tablet (40 mg total) by mouth daily.  90 tablet  3   No current facility-administered medications on file prior to visit.   Review of Systems  Constitutional: Negative for unexpected weight change, or unusual diaphoresis  HENT: Negative for tinnitus.   Eyes: Negative for photophobia and visual disturbance.  Respiratory: Negative for choking and stridor.   Gastrointestinal: Negative for vomiting and blood in stool.  Genitourinary: Negative for hematuria and decreased urine volume.  Musculoskeletal: Negative for acute joint swelling.  Neurological: Negative for tremors and numbness other than noted  Psychiatric/Behavioral: Negative for decreased concentration or  hyperactivity.       Objective:   Physical Exam BP 120/80  Pulse 69  Temp(Src) 97.9 F (36.6 C) (Oral)  Ht 5' 5.5" (1.664 m)  Wt 151 lb 8 oz (68.72 kg)  BMI 24.82 kg/m2  SpO2 97% VS noted,  Constitutional: Pt appears well-developed and well-nourished.  HENT: Head: NCAT.  Right Ear: External ear normal.  Left Ear: External ear normal.  Eyes:  Conjunctivae and EOM are normal. Pupils are equal, round, and reactive to light.  Neck: Normal range of motion. Neck supple.  Cardiovascular: Normal rate and regular rhythm.   Pulmonary/Chest: Effort normal and breath sounds normal.  Neurological: Pt is alert. Not confused  Skin: left hand with sting site mild erythema, tender approx 2 cm near left thumb and wrist, no red streaks, fluctuance Also, non tender plaquelike rash to right mid/lower back with excoriations;  Has also several other insect sting sites to arms, left distal leg and  thigh Psychiatric: Pt behavior is normal. Thought content normal. mild to mod nervous    Assessment & Plan:

## 2013-04-22 NOTE — Telephone Encounter (Signed)
Patient Information:  Caller Name: [name]  Phone: [phone]  Patient: Robyn Bailey, Robyn Bailey  Gender: Female  DOB: [birth year]  Age: 72 Years  PCP: Oliver Barre (Adults only)  Office Follow Up:  Does the office need to follow up with this patient?: No  Instructions For The Office: N/A  RN Note:  Sting sites are very tender to the touch.  Symptoms  Reason For Call & Symptoms: Stung 7 times by black wasp on shoulder, back, ankle, finger, wrist and behind right ear.  Reports severe burning and itching is keeping her awake at night.  Reviewed Health History In EMR: Yes  Reviewed Medications In EMR: Yes  Reviewed Allergies In EMR: Yes  Reviewed Surgeries / Procedures: Yes  Date of Onset of Symptoms: 04/20/2013  Treatments Tried: baking soda & vinegar paste, Benadryl, Meloxicam, hot shower, ice, Neosporin  Treatments Tried Worked: No  Guideline(s) Used:  Warden/ranger  Disposition Per Guideline:   See Today in Office  Reason For Disposition Reached:   Red or very tender (to touch) area, and started over 24 hours after the sting  Advice Given:  Try to Remove the Stinger (if present):  Use a fingernail, credit card edge, or knife-edge to scrape it off. Don't pull it out. (Reason: squeezes out more venom). If the stinger is below the skin surface, leave it alone. It will be shed with normal skin healing.  In many cases no stinger will be present. Only bees leave their stingers. Wasps, yellow jackets, and hornets do not.  Apply Cold to the Area for Pain - Cold Pack Method:  Wrap a bag of ice in a towel (or use a bag of frozen vegetables such as peas).  Apply this cold pack to the area of the sting for 10-20 minutes.  You may repeat this as needed, to relieve symptoms of pain and swelling.  Pain Medicines:  For pain relief, you can take either acetaminophen, ibuprofen, or naproxen.  They are over-the-counter (OTC) pain drugs. You can buy them at the drugstore.  Hydrocortisone Cream for  Itching:  Hydrocortisone cream applied to the sting area 4 times a day can also help reduce itching. Use it for a couple days until the itch is mild.  Available over-the-counter in [place] as 0.5% and 1% cream.  Antihistamine Medication for Itching:  If the sting becomes very itchy, you can take diphenhydramine (e.g., Benadryl). The adult dosage 25-50 mg by mouth every 6 hours on an as needed basis.  Loratadine is a newer (second generation) antihistamine. The dosage of loratadine (e.g., OTC Claritin, Alavert) is 10 mg once a day.  Cetirizine is a newer (second generation) antihistamine. The dosage of cetirizine (e.g., OTC Zyrtec) is 10 mg once a day.  CAUTION: Antihistamines may cause sleepiness. Do not drink, drive or operate dangerous machinery while taking antihistamines. Do not take antihistamines if you have prostate problems.  Expected Course:  Pain: Severe pain or burning at the site lasts 1 to 2 hours. Pain after this period is usually minimal. Itching often follows the pain.  Redness and Swelling: Normal redness and swelling from the venom can increase for 24 hours following the sting. Redness at the sting site is normal. It doesn't mean that it is infected. The redness can last 3 days and the swelling 7 days.  Stings only rarely get infected.  Call Back If:  Difficulty breathing or swallowing (generally develops within the first 2 hours after the sting; call 911)  Sting  begins to look infected  You become worse.  Patient Will Follow Care Advice:  YES  Appointment Scheduled:  04/22/2013 11:00:00 Appointment Scheduled Provider:  Oliver Barre (Adults only)

## 2013-07-22 ENCOUNTER — Other Ambulatory Visit: Payer: Self-pay

## 2013-08-05 ENCOUNTER — Ambulatory Visit: Payer: Medicare Other

## 2013-08-30 ENCOUNTER — Encounter: Payer: Self-pay | Admitting: *Deleted

## 2013-09-22 ENCOUNTER — Encounter: Payer: Self-pay | Admitting: Internal Medicine

## 2013-10-04 ENCOUNTER — Encounter: Payer: Medicare Other | Admitting: Internal Medicine

## 2013-10-04 ENCOUNTER — Other Ambulatory Visit: Payer: Self-pay | Admitting: Internal Medicine

## 2013-10-20 ENCOUNTER — Ambulatory Visit (INDEPENDENT_AMBULATORY_CARE_PROVIDER_SITE_OTHER): Payer: Managed Care, Other (non HMO)

## 2013-10-20 ENCOUNTER — Encounter: Payer: Self-pay | Admitting: Internal Medicine

## 2013-10-20 ENCOUNTER — Ambulatory Visit (INDEPENDENT_AMBULATORY_CARE_PROVIDER_SITE_OTHER): Payer: Managed Care, Other (non HMO) | Admitting: Internal Medicine

## 2013-10-20 VITALS — BP 100/70 | HR 80 | Temp 97.3°F | Ht 66.0 in | Wt 152.1 lb

## 2013-10-20 DIAGNOSIS — E785 Hyperlipidemia, unspecified: Secondary | ICD-10-CM

## 2013-10-20 DIAGNOSIS — Z23 Encounter for immunization: Secondary | ICD-10-CM

## 2013-10-20 DIAGNOSIS — Z136 Encounter for screening for cardiovascular disorders: Secondary | ICD-10-CM

## 2013-10-20 DIAGNOSIS — K219 Gastro-esophageal reflux disease without esophagitis: Secondary | ICD-10-CM

## 2013-10-20 DIAGNOSIS — F3289 Other specified depressive episodes: Secondary | ICD-10-CM

## 2013-10-20 DIAGNOSIS — F329 Major depressive disorder, single episode, unspecified: Secondary | ICD-10-CM

## 2013-10-20 LAB — URINALYSIS, ROUTINE W REFLEX MICROSCOPIC
BILIRUBIN URINE: NEGATIVE
LEUKOCYTES UA: NEGATIVE
NITRITE: NEGATIVE
PH: 5.5 (ref 5.0–8.0)
Total Protein, Urine: NEGATIVE
UROBILINOGEN UA: 0.2 (ref 0.0–1.0)
Urine Glucose: NEGATIVE
WBC, UA: NONE SEEN (ref 0–?)

## 2013-10-20 LAB — CBC WITH DIFFERENTIAL/PLATELET
BASOS ABS: 0 10*3/uL (ref 0.0–0.1)
Basophils Relative: 0.3 % (ref 0.0–3.0)
EOS PCT: 3 % (ref 0.0–5.0)
Eosinophils Absolute: 0.2 10*3/uL (ref 0.0–0.7)
HCT: 45.4 % (ref 36.0–46.0)
Hemoglobin: 15 g/dL (ref 12.0–15.0)
Lymphocytes Relative: 45.7 % (ref 12.0–46.0)
Lymphs Abs: 2.3 10*3/uL (ref 0.7–4.0)
MCHC: 33 g/dL (ref 30.0–36.0)
MCV: 84.5 fl (ref 78.0–100.0)
MONOS PCT: 10.2 % (ref 3.0–12.0)
Monocytes Absolute: 0.5 10*3/uL (ref 0.1–1.0)
NEUTROS PCT: 40.8 % — AB (ref 43.0–77.0)
Neutro Abs: 2.1 10*3/uL (ref 1.4–7.7)
PLATELETS: 215 10*3/uL (ref 150.0–400.0)
RBC: 5.38 Mil/uL — ABNORMAL HIGH (ref 3.87–5.11)
RDW: 13.6 % (ref 11.5–14.6)
WBC: 5.1 10*3/uL (ref 4.5–10.5)

## 2013-10-20 MED ORDER — FLUTICASONE PROPIONATE 50 MCG/ACT NA SUSP: 2.0000 | Freq: Every day | NASAL | Status: DC

## 2013-10-20 NOTE — Patient Instructions (Addendum)
Please continue all other medications as before, and refills have been done if requested - the flonase Please have the pharmacy call with any other refills you may need. You had new Prevnar pneumonia shot today Please continue your efforts at being more active, low cholesterol diet, and weight control. You are otherwise up to date with prevention measures today.  Please keep your appointments with your specialists as you may have planned  Please remember to call the number to have your colonoscopy followup exam with Dr Olevia Perches soon.  Your EKG was OK today  Please go to the LAB in the Basement (turn left off the elevator) for the tests to be done today You will be contacted by phone if any changes need to be made immediately.  Otherwise, you will receive a letter about your results with an explanation, but please check with MyChart first.  Please remember to sign up for My Chart if you have not done so, as this will be important to you in the future with finding out test results, communicating by private email, and scheduling acute appointments online when needed.  Please return in 1 year for your yearly visit, or sooner if needed

## 2013-10-20 NOTE — Addendum Note (Signed)
Addended by: Sharon Seller B on: 10/20/2013 12:00 PM   Modules accepted: Orders

## 2013-10-20 NOTE — Assessment & Plan Note (Addendum)
stable overall by history and exam,  and pt to continue medical treatment as before,  to f/u any worsening symptoms or concerns  Note:  Total time for pt hx, exam, review of record with pt in the room, determination of diagnoses and plan for further eval and tx is > 40 min, with over 50% spent in coordination and counseling of patient  

## 2013-10-20 NOTE — Progress Notes (Signed)
Pre-visit discussion using our clinic review tool. No additional management support is needed unless otherwise documented below in the visit note.  

## 2013-10-20 NOTE — Assessment & Plan Note (Signed)
stable overall by history and exam, recent data reviewed with pt, and pt to continue medical treatment as before,  to f/u any worsening symptoms or concerns Lab Results  Component Value Date   WBC 5.2 09/28/2012   HGB 14.8 09/28/2012   HCT 43.5 09/28/2012   PLT 216.0 09/28/2012   GLUCOSE 77 09/28/2012   CHOL 208* 09/28/2012   TRIG 116.0 09/28/2012   HDL 65.70 09/28/2012   LDLDIRECT 116.6 09/28/2012   LDLCALC 96 07/24/2006   ALT 22 09/28/2012   AST 21 09/28/2012   NA 141 09/28/2012   K 4.3 09/28/2012   CL 105 09/28/2012   CREATININE 0.7 09/28/2012   BUN 22 09/28/2012   CO2 31 09/28/2012   TSH 0.89 09/28/2012

## 2013-10-20 NOTE — Progress Notes (Signed)
Subjective:    Patient ID: Robyn Bailey, female    DOB: 02/15/1941, 73 y.o.   MRN: [id number]  HPI  Here for yearly f/u;  Overall doing ok;  Pt denies CP, worsening SOB, DOE, wheezing, orthopnea, PND, worsening LE edema, palpitations, dizziness or syncope.  Pt denies neurological change such as new headache, facial or extremity weakness.  Pt denies polydipsia, polyuria, or low sugar symptoms. Pt states overall good compliance with treatment and medications, good tolerability, and has been trying to follow lower cholesterol diet.  Pt denies worsening depressive symptoms, suicidal ideation or panic. No fever, night sweats, wt loss, loss of appetite, or other constitutional symptoms.  Pt states good ability with ADL's, has low fall risk, home safety reviewed and adequate, no other significant changes in hearing or vision, and only occasionally active with exercise.  Does have several wks ongoing nasal allergy symptoms with clearish congestion, itch and sneezing, without fever, pain, ST, cough, swelling or wheezing. Cough had been some improved with protonix but now worse with post nasal gtt.  Still teaches senior yoga. Denies worsening reflux, abd pain, dysphagia, n/v, bowel change or blood.  No recent worsening depressoin as she eneded a female toxic relationship recently, feels like a wt lifted Past Medical History  Diagnosis Date  . HYPERLIPIDEMIA 07/19/2007  . ANXIETY 07/19/2007  . DEPRESSION 07/19/2007  . Blepharospasm 06/06/2008  . PERIPHERAL NEUROPATHY 06/08/2007  . Acute angle-closure glaucoma 04/20/2009  . SINUSITIS- ACUTE-NOS 11/04/2007  . SORE THROAT 02/27/2009  . RHINITIS 02/27/2009  . ALLERGIC RHINITIS 07/19/2007  . DIVERTICULOSIS, COLON 07/19/2007  . OSTEOARTHRITIS 07/19/2007  . LOW BACK PAIN 07/19/2007  . BACK PAIN 11/08/2009  . OSTEOPOROSIS 07/19/2007  . FATIGUE 04/20/2009  . Cough 09/22/2007  . ABNORMAL ELECTROCARDIOGRAM 07/19/2007  . MACULAR DEGENERATION, HX OF 07/19/2007  . COLONIC POLYPS, HX OF  07/19/2007  . Left sided sciatica 01/22/2011   Past Surgical History  Procedure Laterality Date  . Tonsillectomy    . Hammer toe surgury  2003    bilateral  . Breast biopsy      reports that she has never smoked. She does not have any smokeless tobacco history on file. She reports that she drinks alcohol. She reports that she does not use illicit drugs. family history includes Colon cancer in her mother. Allergies  Allergen Reactions  . Hydrocodone-Acetaminophen    Current Outpatient Prescriptions on File Prior to Visit  Medication Sig Dispense Refill  . fexofenadine (ALLEGRA) 180 MG tablet Take 1 tablet (180 mg total) by mouth daily.  90 tablet  3  . meloxicam (MOBIC) 15 MG tablet Take 1 tablet (15 mg total) by mouth daily.  90 tablet  0  . pantoprazole (PROTONIX) 40 MG tablet TAKE 1 TABLET DAILY.  90 tablet  2   No current facility-administered medications on file prior to visit.   Review of Systems Constitutional: Negative for diaphoresis, activity change, appetite change or unexpected weight change.  HENT: Negative for hearing loss, ear pain, facial swelling, mouth sores and neck stiffness.   Eyes: Negative for pain, redness and visual disturbance.  Respiratory: Negative for shortness of breath and wheezing.   Cardiovascular: Negative for chest pain and palpitations.  Gastrointestinal: Negative for diarrhea, blood in stool, abdominal distention or other pain Genitourinary: Negative for hematuria, flank pain or change in urine volume.  Musculoskeletal: Negative for myalgias and joint swelling.  Skin: Negative for color change and wound.  Neurological: Negative for syncope and numbness. other than noted  Hematological: Negative for adenopathy.  Psychiatric/Behavioral: Negative for hallucinations, self-injury, decreased concentration and agitation.      Objective:   Physical Exam BP 100/70  Pulse 80  Temp(Src) 97.3 F (36.3 C) (Oral)  Ht 5\' 6"  (1.676 m)  Wt 152 lb 2 oz  (69.003 kg)  BMI 24.57 kg/m2  SpO2 96% VS noted,  Constitutional: Pt is oriented to person, place, and time. Appears well-developed and well-nourished.  Head: Normocephalic and atraumatic.  Right Ear: External ear normal.  Left Ear: External ear normal.  Nose: Nose normal.  Mouth/Throat: Oropharynx is clear and moist.  Eyes: Conjunctivae and EOM are normal. Pupils are equal, round, and reactive to light.  Neck: Normal range of motion. Neck supple. No JVD present. No tracheal deviation present.  Cardiovascular: Normal rate, regular rhythm, normal heart sounds and intact distal pulses.   Pulmonary/Chest: Effort normal and breath sounds normal.  Abdominal: Soft. Bowel sounds are normal. There is no tenderness. No HSM  Musculoskeletal: Normal range of motion. Exhibits no edema.  Lymphadenopathy:  Has no cervical adenopathy.  Neurological: Pt is alert and oriented to person, place, and time. Pt has normal reflexes. No cranial nerve deficit.  Skin: Skin is warm and dry. No rash noted.  Psychiatric:  Has anxious mood and affect mild, no depressive affect      Assessment & Plan:

## 2013-10-20 NOTE — Assessment & Plan Note (Signed)
stable overall by history and exam, recent data reviewed with pt, and pt to continue medical treatment as before,  to f/u any worsening symptoms or concerns Lab Results  Component Value Date   LDLCALC 96 07/24/2006   ECG reviewed as per emr For f/u labs today

## 2013-10-21 LAB — HEPATIC FUNCTION PANEL
ALBUMIN: 4 g/dL (ref 3.5–5.2)
ALT: 15 U/L (ref 0–35)
AST: 20 U/L (ref 0–37)
Alkaline Phosphatase: 54 U/L (ref 39–117)
Bilirubin, Direct: 0 mg/dL (ref 0.0–0.3)
TOTAL PROTEIN: 7.3 g/dL (ref 6.0–8.3)
Total Bilirubin: 0.6 mg/dL (ref 0.3–1.2)

## 2013-10-21 LAB — BASIC METABOLIC PANEL
BUN: 23 mg/dL (ref 6–23)
CHLORIDE: 109 meq/L (ref 96–112)
CO2: 26 meq/L (ref 19–32)
Calcium: 9.6 mg/dL (ref 8.4–10.5)
Creatinine, Ser: 0.7 mg/dL (ref 0.4–1.2)
GFR: 84.45 mL/min (ref >60.00)
GLUCOSE: 78 mg/dL (ref 70–99)
POTASSIUM: 4.3 meq/L (ref 3.5–5.1)
Sodium: 144 mEq/L (ref 135–145)

## 2013-10-21 LAB — LIPID PANEL
Cholesterol: 226 mg/dL — ABNORMAL HIGH (ref 0–200)
HDL: 81.3 mg/dL (ref >39.00)
Total CHOL/HDL Ratio: 3
Triglycerides: 106 mg/dL (ref 0.0–149.0)
VLDL: 21.2 mg/dL (ref 0.0–40.0)

## 2013-10-21 LAB — LDL CHOLESTEROL, DIRECT: Direct LDL: 134 mg/dL

## 2013-10-21 LAB — TSH: TSH: 0.98 u[IU]/mL (ref 0.35–5.50)

## 2013-12-23 ENCOUNTER — Other Ambulatory Visit: Payer: Self-pay | Admitting: Dermatology

## 2013-12-24 ENCOUNTER — Encounter: Payer: Managed Care, Other (non HMO) | Admitting: Internal Medicine

## 2014-03-31 ENCOUNTER — Encounter: Payer: Self-pay | Admitting: Internal Medicine

## 2014-07-20 ENCOUNTER — Other Ambulatory Visit: Payer: Self-pay | Admitting: Internal Medicine

## 2014-08-23 ENCOUNTER — Encounter: Payer: Self-pay | Admitting: Internal Medicine

## 2014-10-21 ENCOUNTER — Ambulatory Visit (INDEPENDENT_AMBULATORY_CARE_PROVIDER_SITE_OTHER): Payer: Managed Care, Other (non HMO) | Admitting: Internal Medicine

## 2014-10-21 ENCOUNTER — Other Ambulatory Visit (INDEPENDENT_AMBULATORY_CARE_PROVIDER_SITE_OTHER): Payer: Medicare HMO

## 2014-10-21 ENCOUNTER — Encounter: Payer: Self-pay | Admitting: Internal Medicine

## 2014-10-21 VITALS — BP 110/78 | HR 82 | Temp 97.4°F | Ht 66.0 in | Wt 157.0 lb

## 2014-10-21 DIAGNOSIS — Z0000 Encounter for general adult medical examination without abnormal findings: Secondary | ICD-10-CM

## 2014-10-21 DIAGNOSIS — E785 Hyperlipidemia, unspecified: Secondary | ICD-10-CM | POA: Diagnosis not present

## 2014-10-21 DIAGNOSIS — Z800 Family history of malignant neoplasm of digestive organs: Secondary | ICD-10-CM

## 2014-10-21 LAB — HEPATIC FUNCTION PANEL
ALBUMIN: 3.9 g/dL (ref 3.5–5.2)
ALT: 13 U/L (ref 0–35)
AST: 16 U/L (ref 0–37)
Alkaline Phosphatase: 62 U/L (ref 39–117)
BILIRUBIN TOTAL: 0.5 mg/dL (ref 0.2–1.2)
Bilirubin, Direct: 0.1 mg/dL (ref 0.0–0.3)
Total Protein: 6.7 g/dL (ref 6.0–8.3)

## 2014-10-21 LAB — URINALYSIS, ROUTINE W REFLEX MICROSCOPIC
BILIRUBIN URINE: NEGATIVE
KETONES UR: NEGATIVE
LEUKOCYTES UA: NEGATIVE
Nitrite: NEGATIVE
PH: 5.5 (ref 5.0–8.0)
TOTAL PROTEIN, URINE-UPE24: NEGATIVE
Urine Glucose: NEGATIVE
Urobilinogen, UA: 0.2 (ref 0.0–1.0)

## 2014-10-21 LAB — CBC WITH DIFFERENTIAL/PLATELET
BASOS PCT: 0.4 % (ref 0.0–3.0)
Basophils Absolute: 0 10*3/uL (ref 0.0–0.1)
EOS PCT: 2.3 % (ref 0.0–5.0)
Eosinophils Absolute: 0.1 10*3/uL (ref 0.0–0.7)
HEMATOCRIT: 41.5 % (ref 36.0–46.0)
HEMOGLOBIN: 14.1 g/dL (ref 12.0–15.0)
LYMPHS ABS: 1.9 10*3/uL (ref 0.7–4.0)
Lymphocytes Relative: 34.4 % (ref 12.0–46.0)
MCHC: 33.9 g/dL (ref 30.0–36.0)
MCV: 82 fl (ref 78.0–100.0)
MONO ABS: 0.5 10*3/uL (ref 0.1–1.0)
MONOS PCT: 8.5 % (ref 3.0–12.0)
NEUTROS ABS: 2.9 10*3/uL (ref 1.4–7.7)
Neutrophils Relative %: 54.4 % (ref 43.0–77.0)
Platelets: 205 10*3/uL (ref 150.0–400.0)
RBC: 5.07 Mil/uL (ref 3.87–5.11)
RDW: 13.2 % (ref 11.5–15.5)
WBC: 5.4 10*3/uL (ref 4.0–10.5)

## 2014-10-21 LAB — TSH: TSH: 0.88 u[IU]/mL (ref 0.35–4.50)

## 2014-10-21 LAB — BASIC METABOLIC PANEL
BUN: 28 mg/dL — AB (ref 6–23)
CALCIUM: 9.4 mg/dL (ref 8.4–10.5)
CO2: 29 mEq/L (ref 19–32)
CREATININE: 0.76 mg/dL (ref 0.40–1.20)
Chloride: 108 mEq/L (ref 96–112)
GFR: 79.12 mL/min (ref >60.00)
Glucose, Bld: 93 mg/dL (ref 70–99)
POTASSIUM: 4.1 meq/L (ref 3.5–5.1)
Sodium: 140 mEq/L (ref 135–145)

## 2014-10-21 LAB — LIPID PANEL
CHOL/HDL RATIO: 3
Cholesterol: 205 mg/dL — ABNORMAL HIGH (ref 0–200)
HDL: 73 mg/dL (ref >39.00)
LDL CALC: 105 mg/dL — AB (ref 0–99)
NonHDL: 132
TRIGLYCERIDES: 137 mg/dL (ref 0.0–149.0)
VLDL: 27.4 mg/dL (ref 0.0–40.0)

## 2014-10-21 MED ORDER — MELOXICAM 15 MG PO TABS: 15.0000 mg | ORAL_TABLET | Freq: Every day | ORAL | Status: DC

## 2014-10-21 NOTE — Progress Notes (Signed)
Subjective:    Patient ID: Robyn Bailey, female    DOB: 19-Jan-1941, 74 y.o.   MRN: [id number]  HPI  Here for wellness and f/u;  Overall doing ok;  Pt denies CP, worsening SOB, DOE, wheezing, orthopnea, PND, worsening LE edema, palpitations, dizziness or syncope.  Pt denies neurological change such as new headache, facial or extremity weakness.  Pt denies polydipsia, polyuria, or low sugar symptoms. Pt states overall good compliance with treatment and medications, good tolerability, and has been trying to follow lower cholesterol diet.  Pt denies worsening depressive symptoms, suicidal ideation or panic. No fever, night sweats, wt loss, loss of appetite, or other constitutional symptoms.  Pt states good ability with ADL's, has low fall risk, home safety reviewed and adequate, no other significant changes in hearing or vision, and only occasionally active with exercise.  Does have several wks ongoing nasal allergy symptoms with clearish congestion, itch and sneezing, without fever, pain, ST, cough, swelling or wheezing, but with significant post nasal gtt , better with flonase and mucinex.  Not yet tried otc zyrtec. ? Mild seasonal affective d/o.  Wt Readings from Last 3 Encounters:  10/21/14 157 lb (71.215 kg)  10/20/13 152 lb 2 oz (69.003 kg)  04/22/13 151 lb 8 oz (68.72 kg)  Due for mammogram in April 2016. Overdue for colonoscopy Past Medical History  Diagnosis Date  . HYPERLIPIDEMIA 07/19/2007  . ANXIETY 07/19/2007  . DEPRESSION 07/19/2007  . Blepharospasm 06/06/2008  . PERIPHERAL NEUROPATHY 06/08/2007  . Acute angle-closure glaucoma 04/20/2009  . SINUSITIS- ACUTE-NOS 11/04/2007  . SORE THROAT 02/27/2009  . RHINITIS 02/27/2009  . ALLERGIC RHINITIS 07/19/2007  . DIVERTICULOSIS, COLON 07/19/2007  . OSTEOARTHRITIS 07/19/2007  . LOW BACK PAIN 07/19/2007  . BACK PAIN 11/08/2009  . OSTEOPOROSIS 07/19/2007  . FATIGUE 04/20/2009  . Cough 09/22/2007  . ABNORMAL ELECTROCARDIOGRAM 07/19/2007  . MACULAR  DEGENERATION, HX OF 07/19/2007  . COLONIC POLYPS, HX OF 07/19/2007  . Left sided sciatica 01/22/2011   Past Surgical History  Procedure Laterality Date  . Tonsillectomy    . Hammer toe surgury  2003    bilateral  . Breast biopsy      reports that she has never smoked. She does not have any smokeless tobacco history on file. She reports that she drinks alcohol. She reports that she does not use illicit drugs. family history includes Colon cancer in her mother. Allergies  Allergen Reactions  . Hydrocodone-Acetaminophen    Current Outpatient Prescriptions on File Prior to Visit  Medication Sig Dispense Refill  . fexofenadine (ALLEGRA) 180 MG tablet Take 1 tablet (180 mg total) by mouth daily. 90 tablet 3  . fluticasone (FLONASE) 50 MCG/ACT nasal spray Place 2 sprays into both nostrils daily. 16 g 11  . pantoprazole (PROTONIX) 40 MG tablet TAKE 1 TABLET DAILY. 90 tablet 1  . Probiotic Product (ALIGN PO) Take by mouth daily.     No current facility-administered medications on file prior to visit.    Review of Systems Constitutional: Negative for increased diaphoresis, other activity, appetite or other siginficant weight change  HENT: Negative for worsening hearing loss, ear pain, facial swelling, mouth sores and neck stiffness.   Eyes: Negative for other worsening pain, redness or visual disturbance.  Respiratory: Negative for shortness of breath and wheezing.   Cardiovascular: Negative for chest pain and palpitations.  Gastrointestinal: Negative for diarrhea, blood in stool, abdominal distention or other pain Genitourinary: Negative for hematuria, flank pain or change in urine volume.  Musculoskeletal: Negative for myalgias or other joint complaints.  Skin: Negative for color change and wound.  Neurological: Negative for syncope and numbness. other than noted Hematological: Negative for adenopathy. or other swelling Psychiatric/Behavioral: Negative for hallucinations, self-injury,  decreased concentration or other worsening agitation.      Objective:   Physical Exam BP 110/78 mmHg  Pulse 82  Temp(Src) 97.4 F (36.3 C) (Oral)  Ht 5\' 6"  (1.676 m)  Wt 157 lb (71.215 kg)  BMI 25.35 kg/m2  SpO2 98% VS noted,  Constitutional: Pt is oriented to person, place, and time. Appears well-developed and well-nourished.  Head: Normocephalic and atraumatic.  Right Ear: External ear normal.  Left Ear: External ear normal.  Nose: Nose normal.  Mouth/Throat: Oropharynx is clear and moist.  Eyes: Conjunctivae and EOM are normal. Pupils are equal, round, and reactive to light.  Neck: Normal range of motion. Neck supple. No JVD present. No tracheal deviation present.  Cardiovascular: Normal rate, regular rhythm, normal heart sounds and intact distal pulses.   Pulmonary/Chest: Effort normal and breath sounds without rales or wheezing  Abdominal: Soft. Bowel sounds are normal. NT. No HSM  Musculoskeletal: Normal range of motion. Exhibits no edema.  Lymphadenopathy:  Has no cervical adenopathy.  Neurological: Pt is alert and oriented to person, place, and time. Pt has normal reflexes. No cranial nerve deficit. Motor grossly intact Skin: Skin is warm and dry. No rash noted.  Psychiatric:  Has mild nervous mood and affect. Behavior is normal.     Assessment & Plan:

## 2014-10-21 NOTE — Progress Notes (Signed)
Pre visit review using our clinic review tool, if applicable. No additional management support is needed unless otherwise documented below in the visit note. 

## 2014-10-21 NOTE — Patient Instructions (Addendum)
Please continue all other medications as before, and refills have been done if requested.  Please have the pharmacy call with any other refills you may need.  Please continue your efforts at being more active, low cholesterol diet, and weight control.  You are otherwise up to date with prevention measures today.  Please keep your appointments with your specialists as you may have planned  You will be contacted regarding the referral for: colonoscopy with Dr Dennie Fetters  Please go to the LAB in the Basement (turn left off the elevator) for the tests to be done today  You will be contacted by phone if any changes need to be made immediately.  Otherwise, you will receive a letter about your results with an explanation, but please check with MyChart first.   Please return in 1 year for your yearly visit, or sooner if needed, with Lab testing done 3-5 days before

## 2014-10-21 NOTE — Assessment & Plan Note (Signed)

## 2014-10-26 ENCOUNTER — Encounter: Payer: Self-pay | Admitting: Internal Medicine

## 2014-11-25 ENCOUNTER — Telehealth: Payer: Self-pay

## 2014-11-28 NOTE — Telephone Encounter (Signed)
LVM for patient to call the practice for confirmation of flu shot or can call the office for an apt to receive a flu vaccine before 12/15/2014.

## 2014-12-14 ENCOUNTER — Ambulatory Visit (AMBULATORY_SURGERY_CENTER): Payer: Self-pay

## 2014-12-14 VITALS — Ht 65.5 in | Wt 157.0 lb

## 2014-12-14 DIAGNOSIS — Z800 Family history of malignant neoplasm of digestive organs: Secondary | ICD-10-CM

## 2014-12-14 MED ORDER — MOVIPREP 100 G PO SOLR: 1.0000 | Freq: Once | ORAL | Status: DC

## 2014-12-14 NOTE — Progress Notes (Signed)
No allergies to eggs or soy No diet/weight loss meds No past problems with anesthesia (trouble waking up after conscious sedation/ due to low b/p) No home oxygen  Has email  Emmi instructions given for colonoscopy

## 2014-12-28 ENCOUNTER — Encounter: Payer: Self-pay | Admitting: Internal Medicine

## 2014-12-28 ENCOUNTER — Ambulatory Visit (AMBULATORY_SURGERY_CENTER): Payer: Medicare HMO | Admitting: Internal Medicine

## 2014-12-28 VITALS — BP 114/79 | HR 77 | Temp 97.5°F | Resp 16 | Ht 65.0 in | Wt 157.0 lb

## 2014-12-28 DIAGNOSIS — Z800 Family history of malignant neoplasm of digestive organs: Secondary | ICD-10-CM | POA: Diagnosis not present

## 2014-12-28 DIAGNOSIS — D123 Benign neoplasm of transverse colon: Secondary | ICD-10-CM | POA: Diagnosis not present

## 2014-12-28 DIAGNOSIS — Z1211 Encounter for screening for malignant neoplasm of colon: Secondary | ICD-10-CM | POA: Diagnosis present

## 2014-12-28 MED ORDER — SODIUM CHLORIDE 0.9 % IV SOLN: 500.0000 mL | INTRAVENOUS | Status: DC

## 2014-12-28 NOTE — Patient Instructions (Signed)
YOU HAD AN ENDOSCOPIC PROCEDURE TODAY AT [facility]:   Refer to the procedure report that was given to you for any specific questions about what was found during the examination.  If the procedure report does not answer your questions, please call your gastroenterologist to clarify.  If you requested that your care partner not be given the details of your procedure findings, then the procedure report has been included in a sealed envelope for you to review at your convenience later.  YOU SHOULD EXPECT: Some feelings of bloating in the abdomen. Passage of more gas than usual.  Walking can help get rid of the air that was put into your GI tract during the procedure and reduce the bloating. If you had a lower endoscopy (such as a colonoscopy or flexible sigmoidoscopy) you may notice spotting of blood in your stool or on the toilet paper. If you underwent a bowel prep for your procedure, you may not have a normal bowel movement for a few days.  Please Note:  You might notice some irritation and congestion in your nose or some drainage.  This is from the oxygen used during your procedure.  There is no need for concern and it should clear up in a day or so.  SYMPTOMS TO REPORT IMMEDIATELY:   Following lower endoscopy (colonoscopy or flexible sigmoidoscopy):  Excessive amounts of blood in the stool  Significant tenderness or worsening of abdominal pains  Swelling of the abdomen that is new, acute  Fever of 100F or higher   Following upper endoscopy (EGD)  Vomiting of blood or coffee ground material  New chest pain or pain under the shoulder blades  Painful or persistently difficult swallowing  New shortness of breath  Fever of 100F or higher  Black, tarry-looking stools  For urgent or emergent issues, a gastroenterologist can be reached at any hour by calling [phone].   DIET: Your first meal following the procedure should be a small meal and then it is ok to progress to  your normal diet. Heavy or fried foods are harder to digest and may make you feel nauseous or bloated.  Likewise, meals heavy in dairy and vegetables can increase bloating.  Drink plenty of fluids but you should avoid alcoholic beverages for 24 hours. Increase the fiber in your diet.  ACTIVITY:  You should plan to take it easy for the rest of today and you should NOT DRIVE or use heavy machinery until tomorrow (because of the sedation medicines used during the test).    FOLLOW UP: Our staff will call the number listed on your records the next business day following your procedure to check on you and address any questions or concerns that you may have regarding the information given to you following your procedure. If we do not reach you, we will leave a message.  However, if you are feeling well and you are not experiencing any problems, there is no need to return our call.  We will assume that you have returned to your regular daily activities without incident.  If any biopsies were taken you will be contacted by phone or by letter within the next 1-3 weeks.  Please call us at [phone] if you have not heard about the biopsies in 3 weeks.    SIGNATURES/CONFIDENTIALITY: You and/or your care partner have signed paperwork which will be entered into your electronic medical record.  These signatures attest to the fact that that the information above on your  After Visit Summary has been reviewed and is understood.  Full responsibility of the confidentiality of this discharge information lies with you and/or your care-partner.  Read all of the handouts given to you by your recovery room nurse.

## 2014-12-28 NOTE — Op Note (Signed)
[facility]  [employer]. [place], [address]   COLONOSCOPY PROCEDURE REPORT  PATIENT: Robyn Bailey, Robyn Bailey  MR#: [id number] BIRTHDATE: 03-Nov-1940 , 73  yrs. old GENDER: female ENDOSCOPIST: Lafayette Dragon, MD REFERRED GY:BWLSL John, M.D. PROCEDURE DATE:  12/28/2014 PROCEDURE:   Colonoscopy, screening and Colonoscopy with cold biopsy polypectomy First Screening Colonoscopy - Avg.  risk and is 50 yrs.  old or older - No.  Prior Negative Screening - Now for repeat screening. N/A  History of Adenoma - Now for follow-up colonoscopy & has been > or = to 3 yrs.  N/A ASA CLASS:   Class II INDICATIONS:Screening for colonic neoplasia and FH Colon or Rectal Adenocarcinoma. MEDICATIONS: Monitored anesthesia care and Propofol 200 mg IV  DESCRIPTION OF PROCEDURE:   After the risks benefits and alternatives of the procedure were thoroughly explained, informed consent was obtained.  The digital rectal exam revealed no abnormalities of the rectum.   The LB PCF Q180 [id number]  endoscope was introduced through the anus and advanced to the cecum, which was identified by both the appendix and ileocecal valve. No adverse events experienced.   The quality of the prep was good.  (MoviPrep was used)  The instrument was then slowly withdrawn as the colon was fully examined.      COLON FINDINGS: A sessile polyp measuring 3 mm in size was found in the transverse colon.  A polypectomy was performed with cold forceps.  The resection was complete, the polyp tissue was completely retrieved and sent to histology.   There was mild diverticulosis noted in the sigmoid colon with associated tortuosity and muscular hypertrophy.  Retroflexed views revealed no abnormalities. The time to cecum = 6.27 Withdrawal time = 7.30 The scope was withdrawn and the procedure completed. COMPLICATIONS: There were no immediate complications.  ENDOSCOPIC IMPRESSION: 1.   Sessile polyp was found in the transverse  colon; polypectomy was performed with cold forceps 2.   There was mild diverticulosis noted in the sigmoid colon  RECOMMENDATIONS: 1.  Await pathology results 2.  High-fiber diet Recall colonoscopy in 5 years  eSigned:  Lafayette Dragon, MD 12/28/2014 10:00 AM   cc:   PATIENT NAME:  [place], [place] MR#: [id number]

## 2014-12-28 NOTE — Progress Notes (Signed)
Report to PACU, RN, vss, BBS= Clear.  

## 2014-12-28 NOTE — Progress Notes (Signed)
Called to room to assist during endoscopic procedure.  Patient ID and intended procedure confirmed with present staff. Received instructions for my participation in the procedure from the performing physician.  

## 2014-12-29 ENCOUNTER — Telehealth: Payer: Self-pay | Admitting: *Deleted

## 2014-12-29 NOTE — Telephone Encounter (Signed)
  Follow up Call-  Call back number 12/28/2014  Post procedure Call Back phone  # [phone]  Permission to leave phone message Yes     No answer at # given.  Left message on VM.

## 2015-01-03 ENCOUNTER — Encounter: Payer: Self-pay | Admitting: Internal Medicine

## 2015-07-05 ENCOUNTER — Ambulatory Visit (INDEPENDENT_AMBULATORY_CARE_PROVIDER_SITE_OTHER): Payer: Medicare HMO | Admitting: Internal Medicine

## 2015-07-05 ENCOUNTER — Encounter: Payer: Self-pay | Admitting: Internal Medicine

## 2015-07-05 VITALS — BP 110/72 | HR 82 | Wt 154.0 lb

## 2015-07-05 DIAGNOSIS — F329 Major depressive disorder, single episode, unspecified: Secondary | ICD-10-CM | POA: Diagnosis not present

## 2015-07-05 DIAGNOSIS — J309 Allergic rhinitis, unspecified: Secondary | ICD-10-CM | POA: Diagnosis not present

## 2015-07-05 DIAGNOSIS — F32A Depression, unspecified: Secondary | ICD-10-CM

## 2015-07-05 DIAGNOSIS — K219 Gastro-esophageal reflux disease without esophagitis: Secondary | ICD-10-CM

## 2015-07-05 DIAGNOSIS — R69 Illness, unspecified: Secondary | ICD-10-CM | POA: Diagnosis not present

## 2015-07-05 MED ORDER — FLUTICASONE PROPIONATE 50 MCG/ACT NA SUSP
2.0000 | Freq: Every day | NASAL | Status: DC
Start: 2015-07-05 — End: 2016-11-08

## 2015-07-05 MED ORDER — PANTOPRAZOLE SODIUM 40 MG PO TBEC: 40.0000 mg | DELAYED_RELEASE_TABLET | Freq: Every day | ORAL | Status: DC

## 2015-07-05 NOTE — Progress Notes (Signed)
Pre visit review using our clinic review tool, if applicable. No additional management support is needed unless otherwise documented below in the visit note. 

## 2015-07-05 NOTE — Progress Notes (Signed)
Subjective:    Patient ID: Robyn Bailey, female    DOB: 1941/08/15, 74 y.o.   MRN: [id number]  HPI  Here with c/o worsening reflux out of PPI for 2 mo, otc nexium not working well, but no abd pain, dysphagia, n/v, bowel change or blood, vomiing or wt loss. Does have several wks ongoing nasal allergy symptoms with clearish congestion, itch and sneezing, without fever, pain, ST, cough, swelling or wheezing Ran out of flonase.  Also states she actually does not have macular dengeration (her mother had thi), asks for record change to say she has hx of narrow angle glaucoma, sees optho reguarly. Denies worsening depressive symptoms, suicidal ideation, or panic; has ongoing anxiety, not increased recently.  Past Medical History  Diagnosis Date  . HYPERLIPIDEMIA 07/19/2007  . ANXIETY 07/19/2007  . DEPRESSION 07/19/2007  . Blepharospasm 06/06/2008  . PERIPHERAL NEUROPATHY 06/08/2007  . Acute angle-closure glaucoma 04/20/2009  . SINUSITIS- ACUTE-NOS 11/04/2007  . SORE THROAT 02/27/2009  . RHINITIS 02/27/2009  . ALLERGIC RHINITIS 07/19/2007  . DIVERTICULOSIS, COLON 07/19/2007  . OSTEOARTHRITIS 07/19/2007  . LOW BACK PAIN 07/19/2007  . BACK PAIN 11/08/2009  . OSTEOPOROSIS 07/19/2007  . FATIGUE 04/20/2009  . Cough 09/22/2007  . ABNORMAL ELECTROCARDIOGRAM 07/19/2007  . MACULAR DEGENERATION, HX OF 07/19/2007  . COLONIC POLYPS, HX OF 07/19/2007  . Left sided sciatica 01/22/2011   Past Surgical History  Procedure Laterality Date  . Tonsillectomy    . Hammer toe surgury  2003    bilateral  . Breast biopsy      reports that she has quit smoking. She has never used smokeless tobacco. She reports that she drinks alcohol. She reports that she does not use illicit drugs. family history includes Colon cancer in her maternal uncle and mother. Allergies  Allergen Reactions  . Hydrocodone-Acetaminophen    Current Outpatient Prescriptions on File Prior to Visit  Medication Sig Dispense Refill  . meloxicam (MOBIC) 15 MG  tablet Take 1 tablet (15 mg total) by mouth daily. 90 tablet 3  . Minoxidil 5 % FOAM Apply topically. Solution not foam    . OVER THE COUNTER MEDICATION Mucous relief    . Probiotic Product (ALIGN PO) Take by mouth daily.    . fexofenadine (ALLEGRA) 180 MG tablet Take 1 tablet (180 mg total) by mouth daily. (Patient not taking: Reported on 07/05/2015) 90 tablet 3  . fluticasone (FLONASE) 50 MCG/ACT nasal spray Place 2 sprays into both nostrils daily. (Patient not taking: Reported on 07/05/2015) 16 g 11  . pantoprazole (PROTONIX) 40 MG tablet TAKE 1 TABLET DAILY. (Patient not taking: Reported on 07/05/2015) 90 tablet 1   No current facility-administered medications on file prior to visit.    Review of Systems  Constitutional: Negative for unusual diaphoresis or night sweats HENT: Negative for ringing in ear or discharge Eyes: Negative for double vision or worsening visual disturbance.  Respiratory: Negative for choking and stridor.   Gastrointestinal: Negative for vomiting or other signifcant bowel change Genitourinary: Negative for hematuria or change in urine volume.  Musculoskeletal: Negative for other MSK pain or swelling Skin: Negative for color change and worsening wound.  Neurological: Negative for tremors and numbness other than noted  Psychiatric/Behavioral: Negative for decreased concentration or agitation other than above       Objective:   Physical Exam BP 110/72 mmHg  Pulse 82  Wt 154 lb (69.854 kg)  SpO2 97% VS noted,  Constitutional: Pt appears in no significant distress HENT: Head: NCAT.  Right Ear: External ear normal.  Left Ear: External ear normal.  Bilat tm's with mild erythema.  Max sinus areas non tender.  Pharynx with mild erythema, no exudateEyes: . Pupils are equal, round, and reactive to light. Conjunctivae and EOM are normal Neck: Normal range of motion. Neck supple.  Cardiovascular: Normal rate and regular rhythm.   Pulmonary/Chest: Effort normal and  breath sounds without rales or wheezing.  Abd:  Soft, NT, ND, + BS Neurological: Pt is alert. Not confused , motor grossly intact Skin: Skin is warm. No rash, no LE edema Psychiatric: Pt behavior is normal. No agitation. mild nervous    Assessment & Plan:

## 2015-07-05 NOTE — Assessment & Plan Note (Signed)
stable overall by history and exam, recent data reviewed with pt, and pt to continue medical treatment as before,  to f/u any worsening symptoms or concerns Lab Results  Component Value Date   WBC 5.4 10/21/2014   HGB 14.1 10/21/2014   HCT 41.5 10/21/2014   PLT 205.0 10/21/2014   GLUCOSE 93 10/21/2014   CHOL 205* 10/21/2014   TRIG 137.0 10/21/2014   HDL 73.00 10/21/2014   LDLDIRECT 134.0 10/20/2013   LDLCALC 105* 10/21/2014   ALT 13 10/21/2014   AST 16 10/21/2014   NA 140 10/21/2014   K 4.1 10/21/2014   CL 108 10/21/2014   CREATININE 0.76 10/21/2014   BUN 28* 10/21/2014   CO2 29 10/21/2014   TSH 0.88 10/21/2014

## 2015-07-05 NOTE — Patient Instructions (Addendum)
OK to re-start the flonase and protonix  Please continue all other medications as before  Please have the pharmacy call with any other refills you may need.  Please continue your efforts at being more active, low cholesterol diet, and weight control.  Please keep your appointments with your specialists as you may have planned  Your diagnosis list was corrected on the computer chart

## 2015-07-05 NOTE — Assessment & Plan Note (Signed)
For protonix re-start, ow no red flags and stable overall by history and exam, recent data reviewed with pt, and pt to continue medical treatment as before,  to f/u any worsening symptoms or concerns

## 2015-07-05 NOTE — Assessment & Plan Note (Signed)
Mild to mod, not cpontrolled with OTC antihistamine, for re-start flonase asd,  to f/u any worsening symptoms or concerns

## 2015-07-20 DIAGNOSIS — H2513 Age-related nuclear cataract, bilateral: Secondary | ICD-10-CM | POA: Diagnosis not present

## 2015-07-20 DIAGNOSIS — H40033 Anatomical narrow angle, bilateral: Secondary | ICD-10-CM | POA: Diagnosis not present

## 2015-07-26 ENCOUNTER — Telehealth: Payer: Self-pay | Admitting: Internal Medicine

## 2015-07-26 DIAGNOSIS — K219 Gastro-esophageal reflux disease without esophagitis: Secondary | ICD-10-CM

## 2015-07-26 MED ORDER — PANTOPRAZOLE SODIUM 40 MG PO TBEC
40.0000 mg | DELAYED_RELEASE_TABLET | Freq: Two times a day (BID) | ORAL | Status: DC
Start: 2015-07-26 — End: 2016-08-29

## 2015-07-26 NOTE — Telephone Encounter (Signed)
Patient called to advise that she has been taking pantoprazole (PROTONIX) 40 MG tablet [[id number]] , and the medication is barely working. She is asking if there is an alternative, or if she should increase her dosage. Please give her a call to clarify

## 2015-07-26 NOTE — Telephone Encounter (Signed)
[place] for bid dosing, but will need to be referred to GI as well, to determine why the usual dose is not working well

## 2015-07-26 NOTE — Telephone Encounter (Signed)
Pt advised of new dosing and referral

## 2015-07-27 ENCOUNTER — Encounter: Payer: Self-pay | Admitting: Gastroenterology

## 2015-09-15 ENCOUNTER — Encounter: Payer: Self-pay | Admitting: Internal Medicine

## 2015-09-15 ENCOUNTER — Ambulatory Visit (INDEPENDENT_AMBULATORY_CARE_PROVIDER_SITE_OTHER): Payer: Medicare HMO | Admitting: Internal Medicine

## 2015-09-15 VITALS — BP 120/80 | HR 74 | Temp 98.0°F | Ht 65.0 in | Wt 155.0 lb

## 2015-09-15 DIAGNOSIS — H9312 Tinnitus, left ear: Secondary | ICD-10-CM

## 2015-09-15 DIAGNOSIS — F329 Major depressive disorder, single episode, unspecified: Secondary | ICD-10-CM

## 2015-09-15 DIAGNOSIS — R69 Illness, unspecified: Secondary | ICD-10-CM | POA: Diagnosis not present

## 2015-09-15 DIAGNOSIS — F411 Generalized anxiety disorder: Secondary | ICD-10-CM | POA: Diagnosis not present

## 2015-09-15 DIAGNOSIS — F32A Depression, unspecified: Secondary | ICD-10-CM

## 2015-09-15 MED ORDER — CETIRIZINE HCL 10 MG PO TABS: 10.0000 mg | ORAL_TABLET | Freq: Every day | ORAL | Status: DC

## 2015-09-15 NOTE — Assessment & Plan Note (Addendum)
Etiology unclear, nonpulsatile, exam with minimal findings, for change allegra to zyrtec asd, cont flonase daiily, mucinex otc bid, consider sudafed prn, does not need antibx at this time, also for ENT referral per pt request, consider MRI but tinnitus is nonpulsatile

## 2015-09-15 NOTE — Assessment & Plan Note (Signed)
stable overall by history and exam, recent data reviewed with pt, and pt to continue medical treatment as before,  to f/u any worsening symptoms or concerns Lab Results  Component Value Date   WBC 5.4 10/21/2014   HGB 14.1 10/21/2014   HCT 41.5 10/21/2014   PLT 205.0 10/21/2014   GLUCOSE 93 10/21/2014   CHOL 205* 10/21/2014   TRIG 137.0 10/21/2014   HDL 73.00 10/21/2014   LDLDIRECT 134.0 10/20/2013   LDLCALC 105* 10/21/2014   ALT 13 10/21/2014   AST 16 10/21/2014   NA 140 10/21/2014   K 4.1 10/21/2014   CL 108 10/21/2014   CREATININE 0.76 10/21/2014   BUN 28* 10/21/2014   CO2 29 10/21/2014   TSH 0.88 10/21/2014    

## 2015-09-15 NOTE — Patient Instructions (Signed)
Ok to stop the allegra  Please take all new medication as prescribed - the zyrtec  Please continue all other medications as before, including the flonase  You can also take Mucinex (or it's generic off brand) for congestion, and tylenol as needed for pain.  Please have the pharmacy call with any other refills you may need.  Please keep your appointments with your specialists as you may have planned  You will be contacted regarding the referral for: ENT

## 2015-09-15 NOTE — Progress Notes (Signed)
Pre visit review using our clinic review tool, if applicable. No additional management support is needed unless otherwise documented below in the visit note. 

## 2015-09-15 NOTE — Assessment & Plan Note (Signed)
Mild situational worse today, for reasurrance,  to f/u any worsening symptoms or concerns

## 2015-09-15 NOTE — Progress Notes (Signed)
Subjective:    Patient ID: Robyn Bailey, female    DOB: March 09, 1941, 74 y.o.   MRN: [id number]  HPI  Here to f/u, symtpoms last visit resolved, still taking PPI, did not feel needed GI referral, so stopped.  Has been having 1-2 wks gradually worsening "electrical sound like a buzz" type of sound that she thought was "outside" of her but could not find the source, now realizes it is in her head.  Constant. Moderate with occas severe.  No HA, or ST, but has had bilat ear congestive symtpoms.  Can seem like crackle sometimes in the ears, but non pulsatile.  Pt denies chest pain, increased sob or doe, wheezing, orthopnea, PND, increased LE swelling, palpitations, dizziness or syncope.  Pt denies fever, wt loss, night sweats, loss of appetite, or other constitutional symptoms  Allegra not working, has not been taking her zyrtec every day.  No hearing loss.  Has hx of peripheral neuropathy.  Denies worsening depressive symptoms, suicidal ideation, or panic; has ongoing anxiety Past Medical History  Diagnosis Date  . HYPERLIPIDEMIA 07/19/2007  . ANXIETY 07/19/2007  . DEPRESSION 07/19/2007  . Blepharospasm 06/06/2008  . PERIPHERAL NEUROPATHY 06/08/2007  . Anatomical narrow angle glaucoma 04/20/2009  . SINUSITIS- ACUTE-NOS 11/04/2007  . SORE THROAT 02/27/2009  . RHINITIS 02/27/2009  . ALLERGIC RHINITIS 07/19/2007  . DIVERTICULOSIS, COLON 07/19/2007  . OSTEOARTHRITIS 07/19/2007  . LOW BACK PAIN 07/19/2007  . BACK PAIN 11/08/2009  . OSTEOPOROSIS 07/19/2007  . FATIGUE 04/20/2009  . Cough 09/22/2007  . ABNORMAL ELECTROCARDIOGRAM 07/19/2007  . COLONIC POLYPS, HX OF 07/19/2007  . Left sided sciatica 01/22/2011   Past Surgical History  Procedure Laterality Date  . Tonsillectomy    . Hammer toe surgury  2003    bilateral  . Breast biopsy      reports that she has quit smoking. She has never used smokeless tobacco. She reports that she drinks alcohol. She reports that she does not use illicit drugs. family history  includes Colon cancer in her maternal uncle and mother. Allergies  Allergen Reactions  . Hydrocodone-Acetaminophen    Current Outpatient Prescriptions on File Prior to Visit  Medication Sig Dispense Refill  . fexofenadine (ALLEGRA) 180 MG tablet Take 1 tablet (180 mg total) by mouth daily. 90 tablet 3  . fluticasone (FLONASE) 50 MCG/ACT nasal spray Place 2 sprays into both nostrils daily. 16 g 11  . meloxicam (MOBIC) 15 MG tablet Take 1 tablet (15 mg total) by mouth daily. 90 tablet 3  . Minoxidil 5 % FOAM Apply topically. Solution not foam    . OVER THE COUNTER MEDICATION Mucous relief    . pantoprazole (PROTONIX) 40 MG tablet Take 1 tablet (40 mg total) by mouth 2 (two) times daily. 180 tablet 3  . Probiotic Product (ALIGN PO) Take by mouth daily.     No current facility-administered medications on file prior to visit.   Review of Systems  Constitutional: Negative for unusual diaphoresis or night sweats HENT: Negative for ringing in ear or discharge Eyes: Negative for double vision or worsening visual disturbance.  Respiratory: Negative for choking and stridor.   Gastrointestinal: Negative for vomiting or other signifcant bowel change Genitourinary: Negative for hematuria or change in urine volume.  Musculoskeletal: Negative for other MSK pain or swelling Skin: Negative for color change and worsening wound.  Neurological: Negative for tremors and numbness other than noted  Psychiatric/Behavioral: Negative for decreased concentration or agitation other than above  Objective:   Physical Exam BP 120/80 mmHg  Pulse 74  Temp(Src) 98 F (36.7 C) (Oral)  Ht 5\' 5"  (1.651 m)  Wt 155 lb (70.308 kg)  BMI 25.79 kg/m2  SpO2 98% VS noted, not ill appearing Constitutional: Pt appears in no significant distress HENT: Head: NCAT.  Right Ear: External ear normal.  Left Ear: External ear normal.  Left TM with mild erythema  Normal range of motion. Neck supple.  Cardiovascular:  Normal rate and regular rhythm.   Pulmonary/Chest: Effort normal and breath sounds without rales or wheezing.  Neurological: Pt is alert. Not confused , motor grossly intact, gait intact Skin: Skin is warm. No rash, no LE edema Psychiatric: Pt Eyes: . Pupils are equal, round, and reactive to light. Conjunctivae and EOM are normal Neck: behavior is normal. No agitation. 2+ nervous, not depressed affect    Assessment & Plan:

## 2015-09-28 DIAGNOSIS — H9313 Tinnitus, bilateral: Secondary | ICD-10-CM | POA: Diagnosis not present

## 2015-09-28 DIAGNOSIS — H903 Sensorineural hearing loss, bilateral: Secondary | ICD-10-CM | POA: Diagnosis not present

## 2015-10-03 ENCOUNTER — Ambulatory Visit: Payer: Medicare HMO | Admitting: Gastroenterology

## 2015-10-11 DIAGNOSIS — R69 Illness, unspecified: Secondary | ICD-10-CM | POA: Diagnosis not present

## 2015-10-26 ENCOUNTER — Encounter: Payer: Self-pay | Admitting: Internal Medicine

## 2015-10-26 ENCOUNTER — Other Ambulatory Visit (INDEPENDENT_AMBULATORY_CARE_PROVIDER_SITE_OTHER): Payer: Medicare HMO

## 2015-10-26 ENCOUNTER — Ambulatory Visit (INDEPENDENT_AMBULATORY_CARE_PROVIDER_SITE_OTHER): Payer: Medicare HMO | Admitting: Internal Medicine

## 2015-10-26 VITALS — BP 118/76 | HR 62 | Temp 98.4°F | Resp 20 | Ht 65.0 in | Wt 151.0 lb

## 2015-10-26 DIAGNOSIS — H9312 Tinnitus, left ear: Secondary | ICD-10-CM | POA: Diagnosis not present

## 2015-10-26 DIAGNOSIS — Z0000 Encounter for general adult medical examination without abnormal findings: Secondary | ICD-10-CM

## 2015-10-26 DIAGNOSIS — F411 Generalized anxiety disorder: Secondary | ICD-10-CM | POA: Diagnosis not present

## 2015-10-26 DIAGNOSIS — R69 Illness, unspecified: Secondary | ICD-10-CM | POA: Diagnosis not present

## 2015-10-26 DIAGNOSIS — E785 Hyperlipidemia, unspecified: Secondary | ICD-10-CM

## 2015-10-26 LAB — CBC WITH DIFFERENTIAL/PLATELET
Basophils Absolute: 0 10*3/uL (ref 0.0–0.1)
Basophils Relative: 0.4 % (ref 0.0–3.0)
EOS PCT: 2.4 % (ref 0.0–5.0)
Eosinophils Absolute: 0.1 10*3/uL (ref 0.0–0.7)
HCT: 44.3 % (ref 36.0–46.0)
HEMOGLOBIN: 14.7 g/dL (ref 12.0–15.0)
Lymphocytes Relative: 38.6 % (ref 12.0–46.0)
Lymphs Abs: 1.8 10*3/uL (ref 0.7–4.0)
MCHC: 33.2 g/dL (ref 30.0–36.0)
MCV: 82.9 fl (ref 78.0–100.0)
MONO ABS: 0.4 10*3/uL (ref 0.1–1.0)
Monocytes Relative: 9.2 % (ref 3.0–12.0)
Neutro Abs: 2.3 10*3/uL (ref 1.4–7.7)
Neutrophils Relative %: 49.4 % (ref 43.0–77.0)
Platelets: 219 10*3/uL (ref 150.0–400.0)
RBC: 5.34 Mil/uL — AB (ref 3.87–5.11)
RDW: 14 % (ref 11.5–15.5)
WBC: 4.6 10*3/uL (ref 4.0–10.5)

## 2015-10-26 LAB — HEPATIC FUNCTION PANEL
ALBUMIN: 4 g/dL (ref 3.5–5.2)
ALT: 13 U/L (ref 0–35)
AST: 16 U/L (ref 0–37)
Alkaline Phosphatase: 60 U/L (ref 39–117)
Bilirubin, Direct: 0.1 mg/dL (ref 0.0–0.3)
Total Bilirubin: 0.6 mg/dL (ref 0.2–1.2)
Total Protein: 7 g/dL (ref 6.0–8.3)

## 2015-10-26 LAB — BASIC METABOLIC PANEL
BUN: 23 mg/dL (ref 6–23)
CALCIUM: 10.1 mg/dL (ref 8.4–10.5)
CO2: 29 mEq/L (ref 19–32)
CREATININE: 0.84 mg/dL (ref 0.40–1.20)
Chloride: 107 mEq/L (ref 96–112)
GFR: 70.3 mL/min (ref >60.00)
GLUCOSE: 88 mg/dL (ref 70–99)
POTASSIUM: 4 meq/L (ref 3.5–5.1)
Sodium: 141 mEq/L (ref 135–145)

## 2015-10-26 LAB — URINALYSIS, ROUTINE W REFLEX MICROSCOPIC
BILIRUBIN URINE: NEGATIVE
Ketones, ur: NEGATIVE
LEUKOCYTES UA: NEGATIVE
NITRITE: NEGATIVE
RBC / HPF: NONE SEEN (ref 0–?)
Specific Gravity, Urine: 1.01 (ref 1.000–1.030)
TOTAL PROTEIN, URINE-UPE24: NEGATIVE
URINE GLUCOSE: NEGATIVE
Urobilinogen, UA: 0.2 (ref 0.0–1.0)
pH: 5.5 (ref 5.0–8.0)

## 2015-10-26 LAB — LIPID PANEL
CHOLESTEROL: 229 mg/dL — AB (ref 0–200)
HDL: 77.1 mg/dL (ref >39.00)
LDL CALC: 137 mg/dL — AB (ref 0–99)
NonHDL: 152.05
TRIGLYCERIDES: 74 mg/dL (ref 0.0–149.0)
Total CHOL/HDL Ratio: 3
VLDL: 14.8 mg/dL (ref 0.0–40.0)

## 2015-10-26 LAB — TSH: TSH: 0.78 u[IU]/mL (ref 0.35–4.50)

## 2015-10-26 NOTE — Assessment & Plan Note (Signed)
Chronic peristent, unfort I have nothing further to offer

## 2015-10-26 NOTE — Progress Notes (Signed)
Subjective:    Patient ID: Robyn Bailey, female    DOB: Mar 16, 1941, 75 y.o.   MRN: [id number]  HPI  Here for wellness and f/u;  Overall doing ok;  Pt denies Chest pain, worsening SOB, DOE, wheezing, orthopnea, PND, worsening LE edema, palpitations, dizziness or syncope.  Pt denies neurological change such as new headache, facial or extremity weakness.  Pt denies polydipsia, polyuria, or low sugar symptoms. Pt states overall good compliance with treatment and medications, good tolerability, and has been trying to follow appropriate diet.  Pt denies worsening depressive symptoms, suicidal ideation or panic. No fever, night sweats, wt loss, loss of appetite, or other constitutional symptoms.  Pt states good ability with ADL's, has low fall risk, home safety reviewed and adequate, no other significant changes in hearing or vision, and only occasionally active with exercise.  Did see ENT with tinnitus, had some high freq hearing loss on testing, but mild only. Still having the tinnitus x 4 mo, hard to cope with, gets loud at times., makes her quite nervous, takes freqeunt mucinex believeing there may be fluid in the ears, and some swelling. No fever or ST, or cough. Still takes the zyrtec for allergies. White noise machine no help.  Declines flu shot..  Did have episode last night with lying down, woke up during night during storm, with fast heart beating and loud tinnitus as well.  No assoc symptoms such as dizziness or syncope.  Has been doing accupuncture , helps with coping.   Past Medical History  Diagnosis Date  . HYPERLIPIDEMIA 07/19/2007  . ANXIETY 07/19/2007  . DEPRESSION 07/19/2007  . Blepharospasm 06/06/2008  . PERIPHERAL NEUROPATHY 06/08/2007  . Anatomical narrow angle glaucoma 04/20/2009  . SINUSITIS- ACUTE-NOS 11/04/2007  . SORE THROAT 02/27/2009  . RHINITIS 02/27/2009  . ALLERGIC RHINITIS 07/19/2007  . DIVERTICULOSIS, COLON 07/19/2007  . OSTEOARTHRITIS 07/19/2007  . LOW BACK PAIN 07/19/2007  .  BACK PAIN 11/08/2009  . OSTEOPOROSIS 07/19/2007  . FATIGUE 04/20/2009  . Cough 09/22/2007  . ABNORMAL ELECTROCARDIOGRAM 07/19/2007  . COLONIC POLYPS, HX OF 07/19/2007  . Left sided sciatica 01/22/2011   Past Surgical History  Procedure Laterality Date  . Tonsillectomy    . Hammer toe surgury  2003    bilateral  . Breast biopsy      reports that she has quit smoking. She has never used smokeless tobacco. She reports that she drinks alcohol. She reports that she does not use illicit drugs. family history includes Colon cancer in her maternal uncle and mother. Allergies  Allergen Reactions  . Hydrocodone-Acetaminophen    Review of Systems Constitutional: Negative for increased diaphoresis, other activity, appetite or siginficant weight change other than noted HENT: Negative for worsening hearing loss, ear pain, facial swelling, mouth sores and neck stiffness.   Eyes: Negative for other worsening pain, redness or visual disturbance.  Respiratory: Negative for shortness of breath and wheezing  Cardiovascular: Negative for chest pain and palpitations.  Gastrointestinal: Negative for diarrhea, blood in stool, abdominal distention or other pain Genitourinary: Negative for hematuria, flank pain or change in urine volume.  Musculoskeletal: Negative for myalgias or other joint complaints.  Skin: Negative for color change and wound or drainage.  Neurological: Negative for syncope and numbness. other than noted Hematological: Negative for adenopathy. or other swelling Psychiatric/Behavioral: Negative for hallucinations, SI, self-injury, decreased concentration or other worsening agitation.      Objective:   Physical Exam BP 118/76 mmHg  Pulse 62  Temp(Src) 98.4 F (  36.9 C) (Oral)  Resp 20  Ht 5\' 5"  (1.651 m)  Wt 151 lb (68.493 kg)  BMI 25.13 kg/m2  SpO2 98% VS noted,  Constitutional: Pt is oriented to person, place, and time. Appears well-developed and well-nourished, in no significant  distress Head: Normocephalic and atraumatic.  Right Ear: External ear normal.  Left Ear: External ear normal.  Nose: Nose normal.  Mouth/Throat: Oropharynx is clear and moist.  Eyes: Conjunctivae and EOM are normal. Pupils are equal, round, and reactive to light.  Neck: Normal range of motion. Neck supple. No JVD present. No tracheal deviation present or significant neck LA or mass Cardiovascular: Normal rate, regular rhythm, normal heart sounds and intact distal pulses.   Pulmonary/Chest: Effort normal and breath sounds without rales or wheezing  Abdominal: Soft. Bowel sounds are normal. NT. No HSM  Musculoskeletal: Normal range of motion. Exhibits no edema.  Lymphadenopathy:  Has no cervical adenopathy.  Neurological: Pt is alert and oriented to person, place, and time. Pt has normal reflexes. No cranial nerve deficit. Motor grossly intact Skin: Skin is warm and dry. No rash noted.  Psychiatric:  Has normal mood and affect. Behavior is normal.  2+ nervous    Assessment & Plan:

## 2015-10-26 NOTE — Progress Notes (Signed)
Pre visit review using our clinic review tool, if applicable. No additional management support is needed unless otherwise documented below in the visit note. 

## 2015-10-26 NOTE — Assessment & Plan Note (Signed)
situationally worse on chronic with recent tinnitus without good tx, declines ssri trial

## 2015-10-26 NOTE — Assessment & Plan Note (Signed)

## 2015-10-26 NOTE — Patient Instructions (Addendum)
Your EKG was OK today  Please continue all other medications as before, and refills have been done if requested.  Please have the pharmacy call with any other refills you may need.  Please continue your efforts at being more active, low cholesterol diet, and weight control.  You are otherwise up to date with prevention measures today.  Please keep your appointments with your specialists as you may have planned  Please go to the LAB in the Basement (turn left off the elevator) for the tests to be done today  You will be contacted by phone if any changes need to be made immediately.  Otherwise, you will receive a letter about your results with an explanation, but please check with MyChart first.  Please remember to sign up for MyChart if you have not done so, as this will be important to you in the future with finding out test results, communicating by private email, and scheduling acute appointments online when needed.  Please return in 1 year for your yearly visit, or sooner if needed, with Lab testing done 3-5 days before  

## 2016-01-17 DIAGNOSIS — D2272 Melanocytic nevi of left lower limb, including hip: Secondary | ICD-10-CM | POA: Diagnosis not present

## 2016-01-17 DIAGNOSIS — R238 Other skin changes: Secondary | ICD-10-CM | POA: Diagnosis not present

## 2016-01-17 DIAGNOSIS — D2371 Other benign neoplasm of skin of right lower limb, including hip: Secondary | ICD-10-CM | POA: Diagnosis not present

## 2016-01-17 DIAGNOSIS — D225 Melanocytic nevi of trunk: Secondary | ICD-10-CM | POA: Diagnosis not present

## 2016-01-17 DIAGNOSIS — L821 Other seborrheic keratosis: Secondary | ICD-10-CM | POA: Diagnosis not present

## 2016-01-17 DIAGNOSIS — B359 Dermatophytosis, unspecified: Secondary | ICD-10-CM | POA: Diagnosis not present

## 2016-04-08 DIAGNOSIS — R69 Illness, unspecified: Secondary | ICD-10-CM | POA: Diagnosis not present

## 2016-07-25 DIAGNOSIS — H2513 Age-related nuclear cataract, bilateral: Secondary | ICD-10-CM | POA: Diagnosis not present

## 2016-07-25 DIAGNOSIS — H40033 Anatomical narrow angle, bilateral: Secondary | ICD-10-CM | POA: Diagnosis not present

## 2016-07-25 DIAGNOSIS — H5203 Hypermetropia, bilateral: Secondary | ICD-10-CM | POA: Diagnosis not present

## 2016-08-29 ENCOUNTER — Telehealth: Payer: Self-pay | Admitting: *Deleted

## 2016-08-29 MED ORDER — PANTOPRAZOLE SODIUM 40 MG PO TBEC: 40.0000 mg | DELAYED_RELEASE_TABLET | Freq: Two times a day (BID) | ORAL | 0 refills | Status: DC

## 2016-08-29 NOTE — Telephone Encounter (Signed)
Pt lwft msg on triage stating need refill on Pantoprazole. Called  pt back no answer LMOM rx sent to [facility]...Johny Chess

## 2016-10-21 DIAGNOSIS — R69 Illness, unspecified: Secondary | ICD-10-CM | POA: Diagnosis not present

## 2016-10-29 ENCOUNTER — Encounter: Payer: Medicare HMO | Admitting: Internal Medicine

## 2016-11-06 ENCOUNTER — Encounter: Payer: Medicare HMO | Admitting: Internal Medicine

## 2016-11-08 ENCOUNTER — Encounter: Payer: Self-pay | Admitting: Internal Medicine

## 2016-11-08 ENCOUNTER — Ambulatory Visit (INDEPENDENT_AMBULATORY_CARE_PROVIDER_SITE_OTHER): Payer: Medicare HMO | Admitting: Internal Medicine

## 2016-11-08 VITALS — BP 116/82 | HR 76 | Temp 97.6°F | Ht 65.0 in | Wt 155.0 lb

## 2016-11-08 DIAGNOSIS — J309 Allergic rhinitis, unspecified: Secondary | ICD-10-CM

## 2016-11-08 DIAGNOSIS — H6981 Other specified disorders of Eustachian tube, right ear: Secondary | ICD-10-CM | POA: Diagnosis not present

## 2016-11-08 DIAGNOSIS — H9312 Tinnitus, left ear: Secondary | ICD-10-CM

## 2016-11-08 DIAGNOSIS — H6990 Unspecified Eustachian tube disorder, unspecified ear: Secondary | ICD-10-CM | POA: Insufficient documentation

## 2016-11-08 DIAGNOSIS — H6980 Other specified disorders of Eustachian tube, unspecified ear: Secondary | ICD-10-CM | POA: Insufficient documentation

## 2016-11-08 DIAGNOSIS — Z0000 Encounter for general adult medical examination without abnormal findings: Secondary | ICD-10-CM

## 2016-11-08 MED ORDER — TRIAMCINOLONE ACETONIDE 55 MCG/ACT NA AERO: 2.0000 | INHALATION_SPRAY | Freq: Every day | NASAL | 12 refills | Status: AC

## 2016-11-08 NOTE — Progress Notes (Signed)
Subjective:    Patient ID: Robyn Bailey, female    DOB: 30-Sep-1940, 76 y.o.   MRN: [id number]  HPI  Here for wellness and f/u;  Overall doing ok;  Pt denies Chest pain, worsening SOB, DOE, wheezing, orthopnea, PND, worsening LE edema, palpitations, dizziness or syncope.  Pt denies neurological change such as new headache, facial or extremity weakness.  Pt denies polydipsia, polyuria, or low sugar symptoms. Pt states overall good compliance with treatment and medications, good tolerability, and has been trying to follow appropriate diet.  Pt denies worsening depressive symptoms, suicidal ideation or panic. No fever, night sweats, wt loss, loss of appetite, or other constitutional symptoms.  Pt states good ability with ADL's, has low fall risk, home safety reviewed and adequate, no other significant changes in hearing or vision, and only occasionally active with exercise.   Wt Readings from Last 3 Encounters:  11/08/16 155 lb (70.3 kg)  10/26/15 151 lb (68.5 kg)  09/15/15 155 lb (70.3 kg)   Has chronic tinnitus, but no overall chronic pain, states "I feel better now than I did when I was young"  Stopped her roagine for women for hair loss b/c red online it might make the tinnitus worse.  Has seen ENT, did not have significant hearing loss.  Also has some hyperacusis with painful sound at times, but is coping.  Does also have itchy right and earache on occasion, maybe worse recnelty, also with intermittent right eustach type symptoms.  Has not been using the zyrtec and flonase lately.  Mucinex for 1 yr not helped.  Delsym helps the cough.  No other new hx Past Medical History:  Diagnosis Date  . ABNORMAL ELECTROCARDIOGRAM 07/19/2007  . ALLERGIC RHINITIS 07/19/2007  . Anatomical narrow angle glaucoma 04/20/2009  . ANXIETY 07/19/2007  . BACK PAIN 11/08/2009  . Blepharospasm 06/06/2008  . COLONIC POLYPS, HX OF 07/19/2007  . Cough 09/22/2007  . DEPRESSION 07/19/2007  . DIVERTICULOSIS, COLON 07/19/2007  .  FATIGUE 04/20/2009  . HYPERLIPIDEMIA 07/19/2007  . Left sided sciatica 01/22/2011  . LOW BACK PAIN 07/19/2007  . OSTEOARTHRITIS 07/19/2007  . OSTEOPOROSIS 07/19/2007  . PERIPHERAL NEUROPATHY 06/08/2007  . RHINITIS 02/27/2009  . SINUSITIS- ACUTE-NOS 11/04/2007  . SORE THROAT 02/27/2009   Past Surgical History:  Procedure Laterality Date  . BREAST BIOPSY    . hammer toe surgury  2003   bilateral  . TONSILLECTOMY      reports that she has quit smoking. She has never used smokeless tobacco. She reports that she drinks alcohol. She reports that she does not use drugs. family history includes Colon cancer in her maternal uncle and mother. Allergies  Allergen Reactions  . Hydrocodone-Acetaminophen    Current Outpatient Prescriptions on File Prior to Visit  Medication Sig Dispense Refill  . cetirizine (ZYRTEC) 10 MG tablet Take 1 tablet (10 mg total) by mouth daily. 30 tablet 11  . fluticasone (FLONASE) 50 MCG/ACT nasal spray Place 2 sprays into both nostrils daily. 16 g 11  . meloxicam (MOBIC) 15 MG tablet Take 1 tablet (15 mg total) by mouth daily. 90 tablet 3  . OVER THE COUNTER MEDICATION Mucous relief    . pantoprazole (PROTONIX) 40 MG tablet Take 1 tablet (40 mg total) by mouth 2 (two) times daily. Yearly physical due in February must see MD for refills 180 tablet 0  . Probiotic Product (ALIGN PO) Take by mouth daily.     No current facility-administered medications on file prior to visit.  Review of Systems Constitutional: Negative for increased diaphoresis, or other activity, appetite or siginficant weight change other than noted HENT: Negative for worsening hearing loss, ear pain, facial swelling, mouth sores and neck stiffness.   Eyes: Negative for other worsening pain, redness or visual disturbance.  Respiratory: Negative for choking or stridor Cardiovascular: Negative for other chest pain and palpitations.  Gastrointestinal: Negative for worsening diarrhea, blood in stool, or  abdominal distention Genitourinary: Negative for hematuria, flank pain or change in urine volume.  Musculoskeletal: Negative for myalgias or other joint complaints.  Skin: Negative for other color change and wound or drainage.  Neurological: Negative for syncope and numbness. other than noted Hematological: Negative for adenopathy. or other swelling Psychiatric/Behavioral: Negative for hallucinations, SI, self-injury, decreased concentration or other worsening agitation.  All other system neg per pt    Objective:   Physical Exam BP 116/82   Pulse 76   Temp 97.6 F (36.4 C)   Ht 5\' 5"  (1.651 m)   Wt 155 lb (70.3 kg)   SpO2 98%   BMI 25.79 kg/m  VS noted, not ill appearing Constitutional: Pt is oriented to person, place, and time. Appears well-developed and well-nourished, in no significant distress Head: Normocephalic and atraumatic  Eyes: Conjunctivae and EOM are normal. Pupils are equal, round, and reactive to light Right Ear: External ear normal.  Left Ear: External ear normal Nose: Nose normal.  Bilat tm's with mild erythema.  Max sinus areas non tender.  Pharynx with mild erythema, no exudate Mouth/Throat: Oropharynx is clear and moist  Neck: Normal range of motion. Neck supple. No JVD present. No tracheal deviation present or significant neck LA or mass Cardiovascular: Normal rate, regular rhythm, normal heart sounds and intact distal pulses.   Pulmonary/Chest: Effort normal and breath sounds without rales or wheezing  Abdominal: Soft. Bowel sounds are normal. NT. No HSM  Musculoskeletal: Normal range of motion. Exhibits no edema Lymphadenopathy: Has no cervical adenopathy.  Neurological: Pt is alert and oriented to person, place, and time. Pt has normal reflexes. No cranial nerve deficit. Motor grossly intact Skin: Skin is warm and dry. No rash noted or new ulcers Psychiatric:  Has normal mood and affect. Behavior is normal.   ECG today I have personally  interpreted Sinus  Rhythm  -Left atrial enlargement.   Lab Results  Component Value Date   WBC 4.6 10/26/2015   HGB 14.7 10/26/2015   HCT 44.3 10/26/2015   PLT 219.0 10/26/2015   GLUCOSE 88 10/26/2015   CHOL 229 (H) 10/26/2015   TRIG 74.0 10/26/2015   HDL 77.10 10/26/2015   LDLDIRECT 134.0 10/20/2013   LDLCALC 137 (H) 10/26/2015   ALT 13 10/26/2015   AST 16 10/26/2015   NA 141 10/26/2015   K 4.0 10/26/2015   CL 107 10/26/2015   CREATININE 0.84 10/26/2015   BUN 23 10/26/2015   CO2 29 10/26/2015   TSH 0.78 10/26/2015       Assessment & Plan:

## 2016-11-08 NOTE — Assessment & Plan Note (Signed)
Also for mucinex otc prn,  to f/u any worsening symptoms or concerns  

## 2016-11-08 NOTE — Patient Instructions (Addendum)
Ok to change the flonase to Nasacort  Please continue all other medications as before, and refills have been done if requested.  Please have the pharmacy call with any other refills you may need.  Please continue your efforts at being more active, low cholesterol diet, and weight control.  You are otherwise up to date with prevention measures today.  Please keep your appointments with your specialists as you may have planned  Please go to the LAB in the Basement (turn left off the elevator) for the tests to be done today  You will be contacted by phone if any changes need to be made immediately.  Otherwise, you will receive a letter about your results with an explanation, but please check with MyChart first.  Please remember to sign up for MyChart if you have not done so, as this will be important to you in the future with finding out test results, communicating by private email, and scheduling acute appointments online when needed.  Please return in 1 year for your yearly visit, or sooner if needed, with Lab testing done 3-5 days before

## 2016-11-08 NOTE — Assessment & Plan Note (Signed)

## 2016-11-08 NOTE — Assessment & Plan Note (Addendum)
Mild to mod, for change flonase to safer nasacort asd,  to f/u any worsening symptoms or concerns  In addition to the time spent performing CPE, I spent an additional 15 minutes face to face,in which greater than 50% of this time was spent in counseling and coordination of care for patient's illness as documented.

## 2016-11-08 NOTE — Assessment & Plan Note (Addendum)
D/w pt, Chronic persistent right ear with wax an wane, also with some hyperacusis, still no specific tx available, o/w stable overall by history and exam,

## 2017-01-21 DIAGNOSIS — M795 Residual foreign body in soft tissue: Secondary | ICD-10-CM | POA: Diagnosis not present

## 2017-01-21 DIAGNOSIS — D2272 Melanocytic nevi of left lower limb, including hip: Secondary | ICD-10-CM | POA: Diagnosis not present

## 2017-01-21 DIAGNOSIS — L820 Inflamed seborrheic keratosis: Secondary | ICD-10-CM | POA: Diagnosis not present

## 2017-01-21 DIAGNOSIS — D2372 Other benign neoplasm of skin of left lower limb, including hip: Secondary | ICD-10-CM | POA: Diagnosis not present

## 2017-01-21 DIAGNOSIS — L603 Nail dystrophy: Secondary | ICD-10-CM | POA: Diagnosis not present

## 2017-01-21 DIAGNOSIS — L723 Sebaceous cyst: Secondary | ICD-10-CM | POA: Diagnosis not present

## 2017-01-21 DIAGNOSIS — R238 Other skin changes: Secondary | ICD-10-CM | POA: Diagnosis not present

## 2017-01-21 DIAGNOSIS — D2361 Other benign neoplasm of skin of right upper limb, including shoulder: Secondary | ICD-10-CM | POA: Diagnosis not present

## 2017-02-24 ENCOUNTER — Other Ambulatory Visit: Payer: Self-pay | Admitting: *Deleted

## 2017-02-24 MED ORDER — PANTOPRAZOLE SODIUM 40 MG PO TBEC: 40.0000 mg | DELAYED_RELEASE_TABLET | Freq: Two times a day (BID) | ORAL | 2 refills | Status: DC

## 2017-05-20 DIAGNOSIS — R69 Illness, unspecified: Secondary | ICD-10-CM | POA: Diagnosis not present

## 2017-05-20 DIAGNOSIS — L820 Inflamed seborrheic keratosis: Secondary | ICD-10-CM | POA: Diagnosis not present

## 2017-06-18 ENCOUNTER — Ambulatory Visit (INDEPENDENT_AMBULATORY_CARE_PROVIDER_SITE_OTHER): Payer: Self-pay | Admitting: *Deleted

## 2017-06-18 DIAGNOSIS — I781 Nevus, non-neoplastic: Secondary | ICD-10-CM

## 2017-06-18 NOTE — Progress Notes (Signed)
   Cutaneous Laser:pulsed mode  810j/cm2 400 ms delay  13 ms Duration 0.5 spot  Total pulses: 356 Total energy 564  Total time::8  Pt did not need sclerotherapy. Treated a few spiders and red dots with the CL. Tol well. Good local rxn. Anticipate good results for this nice lady. Follow prn,

## 2017-07-31 DIAGNOSIS — H2513 Age-related nuclear cataract, bilateral: Secondary | ICD-10-CM | POA: Diagnosis not present

## 2017-07-31 DIAGNOSIS — H5203 Hypermetropia, bilateral: Secondary | ICD-10-CM | POA: Diagnosis not present

## 2017-07-31 DIAGNOSIS — H40033 Anatomical narrow angle, bilateral: Secondary | ICD-10-CM | POA: Diagnosis not present

## 2017-08-15 DIAGNOSIS — Z0100 Encounter for examination of eyes and vision without abnormal findings: Secondary | ICD-10-CM | POA: Diagnosis not present

## 2017-09-01 DIAGNOSIS — H25811 Combined forms of age-related cataract, right eye: Secondary | ICD-10-CM | POA: Diagnosis not present

## 2017-09-01 DIAGNOSIS — H2511 Age-related nuclear cataract, right eye: Secondary | ICD-10-CM | POA: Diagnosis not present

## 2017-10-10 ENCOUNTER — Other Ambulatory Visit (INDEPENDENT_AMBULATORY_CARE_PROVIDER_SITE_OTHER): Payer: Medicare HMO

## 2017-10-10 DIAGNOSIS — Z0000 Encounter for general adult medical examination without abnormal findings: Secondary | ICD-10-CM

## 2017-10-10 LAB — URINALYSIS, ROUTINE W REFLEX MICROSCOPIC
Bilirubin Urine: NEGATIVE
Ketones, ur: NEGATIVE
Leukocytes, UA: NEGATIVE
Nitrite: NEGATIVE
Specific Gravity, Urine: >=1.03 — AB (ref 1.000–1.030)
Total Protein, Urine: NEGATIVE
URINE GLUCOSE: NEGATIVE
Urobilinogen, UA: 0.2 (ref 0.0–1.0)
WBC UA: NONE SEEN (ref 0–?)
pH: 5.5 (ref 5.0–8.0)

## 2017-10-10 LAB — BASIC METABOLIC PANEL
BUN: 21 mg/dL (ref 6–23)
CO2: 28 meq/L (ref 19–32)
Calcium: 10.7 mg/dL — ABNORMAL HIGH (ref 8.4–10.5)
Chloride: 105 mEq/L (ref 96–112)
Creatinine, Ser: 0.8 mg/dL (ref 0.40–1.20)
GFR: 73.98 mL/min (ref >60.00)
GLUCOSE: 79 mg/dL (ref 70–99)
Potassium: 4.4 mEq/L (ref 3.5–5.1)
SODIUM: 141 meq/L (ref 135–145)

## 2017-10-10 LAB — LIPID PANEL
CHOLESTEROL: 212 mg/dL — AB (ref 0–200)
HDL: 74 mg/dL (ref >39.00)
LDL Cholesterol: 116 mg/dL — ABNORMAL HIGH (ref 0–99)
NonHDL: 137.74
Total CHOL/HDL Ratio: 3
Triglycerides: 108 mg/dL (ref 0.0–149.0)
VLDL: 21.6 mg/dL (ref 0.0–40.0)

## 2017-10-10 LAB — CBC WITH DIFFERENTIAL/PLATELET
Basophils Absolute: 0 10*3/uL (ref 0.0–0.1)
Basophils Relative: 0.6 % (ref 0.0–3.0)
EOS PCT: 4.4 % (ref 0.0–5.0)
Eosinophils Absolute: 0.2 10*3/uL (ref 0.0–0.7)
HCT: 42.7 % (ref 36.0–46.0)
HEMOGLOBIN: 14.3 g/dL (ref 12.0–15.0)
Lymphocytes Relative: 42.2 % (ref 12.0–46.0)
Lymphs Abs: 2.2 10*3/uL (ref 0.7–4.0)
MCHC: 33.6 g/dL (ref 30.0–36.0)
MCV: 83.7 fl (ref 78.0–100.0)
Monocytes Absolute: 0.5 10*3/uL (ref 0.1–1.0)
Monocytes Relative: 10.3 % (ref 3.0–12.0)
Neutro Abs: 2.2 10*3/uL (ref 1.4–7.7)
Neutrophils Relative %: 42.5 % — ABNORMAL LOW (ref 43.0–77.0)
Platelets: 208 10*3/uL (ref 150.0–400.0)
RBC: 5.1 Mil/uL (ref 3.87–5.11)
RDW: 13.5 % (ref 11.5–15.5)
WBC: 5.2 10*3/uL (ref 4.0–10.5)

## 2017-10-10 LAB — HEPATIC FUNCTION PANEL
ALBUMIN: 4 g/dL (ref 3.5–5.2)
ALT: 12 U/L (ref 0–35)
AST: 16 U/L (ref 0–37)
Alkaline Phosphatase: 73 U/L (ref 39–117)
Bilirubin, Direct: 0.1 mg/dL (ref 0.0–0.3)
Total Bilirubin: 0.7 mg/dL (ref 0.2–1.2)
Total Protein: 7 g/dL (ref 6.0–8.3)

## 2017-10-10 LAB — TSH: TSH: 1.35 u[IU]/mL (ref 0.35–4.50)

## 2017-10-21 DIAGNOSIS — H25812 Combined forms of age-related cataract, left eye: Secondary | ICD-10-CM | POA: Diagnosis not present

## 2017-10-21 DIAGNOSIS — H2512 Age-related nuclear cataract, left eye: Secondary | ICD-10-CM | POA: Diagnosis not present

## 2017-11-11 ENCOUNTER — Encounter: Payer: Self-pay | Admitting: Internal Medicine

## 2017-11-11 ENCOUNTER — Ambulatory Visit (INDEPENDENT_AMBULATORY_CARE_PROVIDER_SITE_OTHER): Payer: Medicare HMO | Admitting: Internal Medicine

## 2017-11-11 ENCOUNTER — Ambulatory Visit (INDEPENDENT_AMBULATORY_CARE_PROVIDER_SITE_OTHER): Payer: Medicare HMO | Admitting: *Deleted

## 2017-11-11 VITALS — BP 118/78 | HR 61 | Temp 97.8°F | Ht 65.0 in | Wt 155.0 lb

## 2017-11-11 DIAGNOSIS — E2839 Other primary ovarian failure: Secondary | ICD-10-CM | POA: Diagnosis not present

## 2017-11-11 DIAGNOSIS — R69 Illness, unspecified: Secondary | ICD-10-CM | POA: Diagnosis not present

## 2017-11-11 DIAGNOSIS — Z0000 Encounter for general adult medical examination without abnormal findings: Secondary | ICD-10-CM | POA: Diagnosis not present

## 2017-11-11 DIAGNOSIS — E785 Hyperlipidemia, unspecified: Secondary | ICD-10-CM

## 2017-11-11 DIAGNOSIS — R002 Palpitations: Secondary | ICD-10-CM

## 2017-11-11 DIAGNOSIS — M5412 Radiculopathy, cervical region: Secondary | ICD-10-CM

## 2017-11-11 DIAGNOSIS — Z0001 Encounter for general adult medical examination with abnormal findings: Secondary | ICD-10-CM | POA: Diagnosis not present

## 2017-11-11 DIAGNOSIS — H9312 Tinnitus, left ear: Secondary | ICD-10-CM

## 2017-11-11 DIAGNOSIS — F411 Generalized anxiety disorder: Secondary | ICD-10-CM | POA: Diagnosis not present

## 2017-11-11 NOTE — Progress Notes (Signed)
Subjective:    Patient ID: Robyn Bailey, female    DOB: 1941-02-27, 77 y.o.   MRN: [id number]  HPI  Here for wellness and f/u;  Overall doing ok;  Pt denies Chest pain, worsening SOB, DOE, wheezing, orthopnea, PND, worsening LE edema, palpitations, dizziness or syncope.  Pt denies neurological change such as new headache, facial weakness.  Pt denies polydipsia, polyuria, or low sugar symptoms. Pt states overall good compliance with treatment and medications, good tolerability, and has been trying to follow appropriate diet.  Pt denies worsening depressive symptoms, suicidal ideation or panic. No fever, night sweats, wt loss, loss of appetite, or other constitutional symptoms.  Pt states good ability with ADL's, has low fall risk, home safety reviewed and adequate, no other significant changes in hearing or vision, and only occasionally active with exercise.  Declines flu shot.   Also state - "somethings changed, I dont know what it is"  Did have worsening stress related to recent surgury bilat cataract, still feels quite on edge with recurring palpitations, as well as an episode one night of whole arm heavy and unsually weak, unable to forward elevate, happened again yesterday with gardening; woke her up the first time and seemed resolved for several seconds only, and assoc temporally assoc with numbness and tingling that seems to start at the right neck. She cannot say there was really pain.  Otherwise feels ok, and also states actually feels overall the best emotionally she felt in her whole life, though can feels down a bit on the dark days in the winter.. Did also have filling redone to left upper back tooth and doing well. Also has intermittent tinnitus left ear.  No other interval hx or complaints Past Medical History:  Diagnosis Date  . ABNORMAL ELECTROCARDIOGRAM 07/19/2007  . ALLERGIC RHINITIS 07/19/2007  . Anatomical narrow angle glaucoma 04/20/2009  . ANXIETY 07/19/2007  . BACK PAIN 11/08/2009  .  Blepharospasm 06/06/2008  . COLONIC POLYPS, HX OF 07/19/2007  . Cough 09/22/2007  . DEPRESSION 07/19/2007  . DIVERTICULOSIS, COLON 07/19/2007  . FATIGUE 04/20/2009  . HYPERLIPIDEMIA 07/19/2007  . Left sided sciatica 01/22/2011  . LOW BACK PAIN 07/19/2007  . OSTEOARTHRITIS 07/19/2007  . OSTEOPOROSIS 07/19/2007  . PERIPHERAL NEUROPATHY 06/08/2007  . RHINITIS 02/27/2009  . SINUSITIS- ACUTE-NOS 11/04/2007  . SORE THROAT 02/27/2009   Past Surgical History:  Procedure Laterality Date  . BREAST BIOPSY    . hammer toe surgury  2003   bilateral  . TONSILLECTOMY      reports that she has quit smoking. she has never used smokeless tobacco. She reports that she drinks alcohol. She reports that she does not use drugs. family history includes Colon cancer in her maternal uncle and mother. Allergies  Allergen Reactions  . Hydrocodone-Acetaminophen    Current Outpatient Medications on File Prior to Visit  Medication Sig Dispense Refill  . cetirizine (ZYRTEC) 10 MG tablet Take 1 tablet (10 mg total) by mouth daily. 30 tablet 11  . meloxicam (MOBIC) 15 MG tablet Take 1 tablet (15 mg total) by mouth daily. 90 tablet 3  . OVER THE COUNTER MEDICATION Mucous relief    . pantoprazole (PROTONIX) 40 MG tablet Take 1 tablet (40 mg total) by mouth 2 (two) times daily. 180 tablet 2  . Probiotic Product (ALIGN PO) Take by mouth daily.    Marland Kitchen triamcinolone (NASACORT AQ) 55 MCG/ACT AERO nasal inhaler Place 2 sprays into the nose daily. 1 Inhaler 12   No current facility-administered  medications on file prior to visit.    Review of Systems Constitutional: Negative for other unusual diaphoresis, sweats, appetite or weight changes HENT: Negative for other worsening hearing loss, ear pain, facial swelling, mouth sores or neck stiffness.   Eyes: Negative for other worsening pain, redness or other visual disturbance.  Respiratory: Negative for other stridor or swelling Cardiovascular: Negative for other palpitations or other  chest pain  Gastrointestinal: Negative for worsening diarrhea or loose stools, blood in stool, distention or other pain Genitourinary: Negative for hematuria, flank pain or other change in urine volume.  Musculoskeletal: Negative for myalgias or other joint swelling.  Skin: Negative for other color change, or other wound or worsening drainage.  Neurological: Negative for other syncope or numbness. Hematological: Negative for other adenopathy or swelling Psychiatric/Behavioral: Negative for hallucinations, other worsening agitation, SI, self-injury, or new decreased concentration All other system neg per pt    Objective:   Physical Exam BP 118/78   Pulse 61   Temp 97.8 F (36.6 C) (Oral)   Ht 5\' 5"  (1.651 m)   Wt 155 lb (70.3 kg)   SpO2 98%   BMI 25.79 kg/m  VS noted,  Constitutional: Pt is oriented to person, place, and time. Appears well-developed and well-nourished, in no significant distress and comfortable Head: Normocephalic and atraumatic  Eyes: Conjunctivae and EOM are normal. Pupils are equal, round, and reactive to light Right Ear: External ear normal without discharge Left Ear: External ear normal without discharge Nose: Nose without discharge or deformity Mouth/Throat: Oropharynx is without other ulcerations and moist  Neck: Normal range of motion. Neck supple. No JVD present. No tracheal deviation present or significant neck LA or mass Cardiovascular: Normal rate, regular rhythm, normal heart sounds and intact distal pulses.   Pulmonary/Chest: WOB normal and breath sounds without rales or wheezing  Abdominal: Soft. Bowel sounds are normal. NT. No HSM  Musculoskeletal: Normal range of motion. Exhibits no edema Lymphadenopathy: Has no other cervical adenopathy.  Neurological: Pt is alert and oriented to person, place, and time. Pt has normal reflexes. No cranial nerve deficit. Motor grossly intact, Gait intact Skin: Skin is warm and dry. No rash noted or new  ulcerations Psychiatric:  Has markedly nervous mood and affect. Behavior is normal without agitation No other exam findings  ECG today I have personally interpreted SR without ischemic changes    Assessment & Plan:

## 2017-11-11 NOTE — Progress Notes (Addendum)
Subjective:   Robyn Bailey is a 77 y.o. female who presents for an Initial Medicare Annual Wellness Visit.  Review of Systems    No ROS.  Medicare Wellness Visit. Additional risk factors are reflected in the social history.   Cardiac Risk Factors include: advanced age (>26men, >59 women);dyslipidemia Sleep patterns: feels rested on waking, gets up 1 times nightly to void and sleeps 7-8 hours nightly.   Home Safety/Smoke Alarms: Feels safe in home. Smoke alarms in place.   Living environment; residence and Firearm Safety: [place], can live on one level, no firearms. Lives alone, no needs for DME, good support system Seat Belt Safety/Bike Helmet: Wears seat belt.     Objective:    There were no vitals filed for this visit. There is no height or weight on file to calculate BMI.  Advanced Directives 11/11/2017 11/08/2016 12/14/2014  Does Patient Have a Medical Advance Directive? Yes No Yes  Type of Paramedic of [place];Living will - [place];Living will  Does patient want to make changes to medical advance directive? - - No - Patient declined  Copy of [place] in Chart? No - copy requested - No - copy requested    Current Medications (verified) Outpatient Encounter Medications as of 11/11/2017  Medication Sig  . cetirizine (ZYRTEC) 10 MG tablet Take 1 tablet (10 mg total) by mouth daily.  . meloxicam (MOBIC) 15 MG tablet Take 1 tablet (15 mg total) by mouth daily.  Marland Kitchen OVER THE COUNTER MEDICATION Mucous relief  . pantoprazole (PROTONIX) 40 MG tablet Take 1 tablet (40 mg total) by mouth 2 (two) times daily.  . Probiotic Product (ALIGN PO) Take by mouth daily.  Marland Kitchen triamcinolone (NASACORT AQ) 55 MCG/ACT AERO nasal inhaler Place 2 sprays into the nose daily.   No facility-administered encounter medications on file as of 11/11/2017.     Allergies (verified) Hydrocodone-acetaminophen   History: Past Medical  History:  Diagnosis Date  . ABNORMAL ELECTROCARDIOGRAM 07/19/2007  . ALLERGIC RHINITIS 07/19/2007  . Anatomical narrow angle glaucoma 04/20/2009  . ANXIETY 07/19/2007  . BACK PAIN 11/08/2009  . Blepharospasm 06/06/2008  . COLONIC POLYPS, HX OF 07/19/2007  . Cough 09/22/2007  . DEPRESSION 07/19/2007  . DIVERTICULOSIS, COLON 07/19/2007  . FATIGUE 04/20/2009  . HYPERLIPIDEMIA 07/19/2007  . Left sided sciatica 01/22/2011  . LOW BACK PAIN 07/19/2007  . OSTEOARTHRITIS 07/19/2007  . OSTEOPOROSIS 07/19/2007  . PERIPHERAL NEUROPATHY 06/08/2007  . RHINITIS 02/27/2009  . SINUSITIS- ACUTE-NOS 11/04/2007  . SORE THROAT 02/27/2009   Past Surgical History:  Procedure Laterality Date  . BREAST BIOPSY    . hammer toe surgury  2003   bilateral  . TONSILLECTOMY     Family History  Problem Relation Age of Onset  . Colon cancer Mother   . Colon cancer Maternal Uncle    Social History   Socioeconomic History  . Marital status: Single    Spouse name: Not on file  . Number of children: Not on file  . Years of education: Not on file  . Highest education level: Not on file  Social Needs  . Financial resource strain: Not on file  . Food insecurity - worry: Not on file  . Food insecurity - inability: Not on file  . Transportation needs - medical: Not on file  . Transportation needs - non-medical: Not on file  Occupational History  . 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 Occupation: part  time research legal  Tobacco Use  . Smoking status: Former Research scientist (life sciences)  . Smokeless tobacco: Never Used  Substance and Sexual Activity  . Alcohol use: Yes    Alcohol/week: 0.0 oz    Comment: occasional glass of wine; rare now b/c acid reflux  . Drug use: No  . Sexual activity: Not on file  Other Topics Concern  . Not on file  Social History Narrative  . Not on file    Tobacco Counseling Counseling given: Not Answered   Activities of Daily Living In your present state of health, do you have  any difficulty performing the following activities: 11/11/2017  Hearing? N  Vision? N  Difficulty concentrating or making decisions? N  Walking or climbing stairs? N  Dressing or bathing? N  Doing errands, shopping? N  Preparing Food and eating ? N  Using the Toilet? N  In the past six months, have you accidently leaked urine? N  Do you have problems with loss of bowel control? N  Managing your Medications? N  Managing your Finances? N  Housekeeping or managing your Housekeeping? N  Some recent data might be hidden     Immunizations and Health Maintenance Immunization History  Administered Date(s) Administered  . Pneumococcal Conjugate-13 10/20/2013  . Pneumococcal Polysaccharide-23 05/17/2006  . Td 04/20/2009   Health Maintenance Due  Topic Date Due  . DEXA SCAN  01/22/2006    Patient Care Team: Biagio Borg, MD as PCP - General  Indicate any recent Medical Services you may have received from other than [facility] providers in the past year (date may be approximate).     Assessment:   This is a routine wellness examination for Robyn Bailey. Physical assessment deferred to PCP.   Hearing/Vision screen Hearing Screening Comments: Able to hear conversational tones w/o difficulty. No issues reported.  Passed whisper test Vision Screening Comments: appointment every 6 months Dr. Prudencio Burly  Dietary issues and exercise activities discussed: Current Exercise Habits: Home exercise routine;Structured exercise class, Type of exercise: walking;yoga;stretching, Time (Minutes): 45, Frequency (Times/Week): 3, Weekly Exercise (Minutes/Week): 135, Intensity: Mild  Diet (meal preparation, eat out, water intake, caffeinated beverages, dairy products, fruits and vegetables): in general, a "healthy" diet  , well balanced  Reviewed heart healthy diet, encouraged patient to increase daily water intake. Relevant patient education assigned to patient using Emmi.  Goals    . Patient Stated     Eat  healthy and exercise to lower my cholesterol. I will increase the amount of water I drink daily. Enjoy life, friends and baking. Plan to plant a garden, continue to volunteer in the community.      Depression Screen PHQ 2/9 Scores 11/11/2017 11/11/2017 11/08/2016 10/26/2015 10/26/2015 10/21/2014 10/20/2013  PHQ - 2 Score 0 0 0 1 0 0 0  PHQ- 9 Score 0 0 - - - - -    Fall Risk Fall Risk  11/11/2017 11/11/2017 11/11/2017 11/08/2016 10/26/2015  Falls in the past year? No No No No No   Cognitive Function: MMSE - Mini Mental State Exam 11/11/2017  Orientation to time 5  Orientation to Place 5  Registration 3  Attention/ Calculation 5  Recall 3  Language- name 2 objects 2  Language- repeat 1  Language- follow 3 step command 3  Language- read & follow direction 1  Write a sentence 1  Copy design 1  Total score 30        Screening Tests Health Maintenance  Topic Date Due  . DEXA SCAN  01/22/2006  . INFLUENZA VACCINE  12/16/2017 (Originally 04/16/2017)  . TETANUS/TDAP  04/21/2019  . COLONOSCOPY  12/28/2019  . PNA vac Low Risk Adult  Completed     Plan:  Continue doing brain stimulating activities (puzzles, reading, adult coloring books, staying active) to keep memory sharp.   Continue to eat heart healthy diet (full of fruits, vegetables, whole grains, lean protein, water--limit salt, fat, and sugar intake) and increase physical activity as tolerated.  I have personally reviewed and noted the following in the patient's chart:   . Medical and social history . Use of alcohol, tobacco or illicit drugs  . Current medications and supplements . Functional ability and status . Nutritional status . Physical activity . Advanced directives . List of other physicians . Vitals . Screenings to include cognitive, depression, and falls . Referrals and appointments  In addition, I have reviewed and discussed with patient certain preventive protocols, quality metrics, and best practice recommendations.  A written personalized care plan for preventive services as well as general preventive health recommendations were provided to patient.     Michiel Cowboy, RN   11/11/2017   Medical screening examination/treatment/procedure(s) were performed by non-physician practitioner and as supervising physician I was immediately available for consultation/collaboration. I agree with above. Cathlean Cower, MD

## 2017-11-11 NOTE — Patient Instructions (Signed)
Continue doing brain stimulating activities (puzzles, reading, adult coloring books, staying active) to keep memory sharp.   Continue to eat heart healthy diet (full of fruits, vegetables, whole grains, lean protein, water--limit salt, fat, and sugar intake) and increase physical activity as tolerated.   Robyn Bailey , Thank you for taking time to come for your Medicare Wellness Visit. I appreciate your ongoing commitment to your health goals. Please review the following plan we discussed and let me know if I can assist you in the future.   These are the goals we discussed: Goals    . Patient Stated     Eat healthy and exercise to lower my cholesterol. I will increase the amount of water I drink daily. Enjoy life, friends and baking. Plan to plant a garden, continue to volunteer in the community.       This is a list of the screening recommended for you and due dates:  Health Maintenance  Topic Date Due  . DEXA scan (bone density measurement)  01/22/2006  . Flu Shot  12/16/2017*  . Tetanus Vaccine  04/21/2019  . Colon Cancer Screening  12/28/2019  . Pneumonia vaccines  Completed  *Topic was postponed. The date shown is not the original due date.

## 2017-11-11 NOTE — Assessment & Plan Note (Addendum)
Improved for some reason after recent dental surgury, declines MRI head

## 2017-11-11 NOTE — Assessment & Plan Note (Signed)
Declines statin for now, imroved so wants to work further on lower chol diet

## 2017-11-11 NOTE — Patient Instructions (Addendum)
Please schedule the bone density test before leaving today at the scheduling desk (where you check out)  Your EKG was OK today  Please return if you have worsening right arm symptoms  Please continue all other medications as before, and refills have been done if requested.  Please have the pharmacy call with any other refills you may need.  Please continue your efforts at being more active, low cholesterol diet, and weight control.  You are otherwise up to date with prevention measures today.  Please keep your appointments with your specialists as you may have planned  Please return in 1 year for your yearly visit, or sooner if needed, with Lab testing done 3-5 days before  We will let Sharee Pimple know you are here for the wellness exam

## 2017-11-12 DIAGNOSIS — R002 Palpitations: Secondary | ICD-10-CM | POA: Insufficient documentation

## 2017-11-12 NOTE — Assessment & Plan Note (Signed)

## 2017-11-12 NOTE — Assessment & Plan Note (Signed)
ecg reviewed, exam benign, ok to follow

## 2017-11-12 NOTE — Assessment & Plan Note (Signed)
Marked nervous today though states she is emotionally the "best she has been her whole life," and declines any further tx or cousneling

## 2017-11-12 NOTE — Assessment & Plan Note (Addendum)
With very brief intermittent RUE weakness, exam ok today, pt declines further eval or tx such as MRI or surgical referral  In addition to the time spent performing CPE, I spent an additional 25 minutes face to face,in which greater than 50% of this time was spent in counseling and coordination of care for patient's acute illness as documented, including the differential dx, treatment, further evaluation and other management of right cervical radiculitis, tinnitus, HLD, palp's, and anxiety

## 2017-11-17 ENCOUNTER — Ambulatory Visit (INDEPENDENT_AMBULATORY_CARE_PROVIDER_SITE_OTHER)
Admission: RE | Admit: 2017-11-17 | Discharge: 2017-11-17 | Disposition: A | Discharge status: Home / Self Care | Payer: Medicare HMO | Source: Ambulatory Visit | Attending: Internal Medicine | Admitting: Internal Medicine

## 2017-11-17 DIAGNOSIS — E2839 Other primary ovarian failure: Secondary | ICD-10-CM

## 2017-11-24 ENCOUNTER — Telehealth: Payer: Self-pay

## 2017-11-24 ENCOUNTER — Other Ambulatory Visit: Payer: Self-pay | Admitting: Internal Medicine

## 2017-11-24 MED ORDER — ALENDRONATE SODIUM 70 MG PO TABS: 70.0000 mg | ORAL_TABLET | ORAL | 3 refills | Status: DC

## 2017-11-24 NOTE — Telephone Encounter (Signed)
Called pt, LVM.   CRM created.  

## 2017-11-24 NOTE — Telephone Encounter (Signed)
-----   Message from Biagio Borg, MD sent at 11/24/2017 12:28 PM EDT ----- Left message on MyChart, pt to cont same tx except  The test results show that your current treatment is OK, except the bone density test  Is consistent with osteoporosis.  We will need to start fosamax 70 mg weekly to slow the process down and reduce risk of fractures, and you should hear from the office as well.   to please inform pt, I will do rx

## 2017-11-26 DIAGNOSIS — R69 Illness, unspecified: Secondary | ICD-10-CM | POA: Diagnosis not present

## 2017-12-29 DIAGNOSIS — Z0100 Encounter for examination of eyes and vision without abnormal findings: Secondary | ICD-10-CM | POA: Diagnosis not present

## 2018-01-08 DIAGNOSIS — R69 Illness, unspecified: Secondary | ICD-10-CM | POA: Diagnosis not present

## 2018-01-29 DIAGNOSIS — D2361 Other benign neoplasm of skin of right upper limb, including shoulder: Secondary | ICD-10-CM | POA: Diagnosis not present

## 2018-01-29 DIAGNOSIS — L820 Inflamed seborrheic keratosis: Secondary | ICD-10-CM | POA: Diagnosis not present

## 2018-01-29 DIAGNOSIS — D2272 Melanocytic nevi of left lower limb, including hip: Secondary | ICD-10-CM | POA: Diagnosis not present

## 2018-01-29 DIAGNOSIS — D2372 Other benign neoplasm of skin of left lower limb, including hip: Secondary | ICD-10-CM | POA: Diagnosis not present

## 2018-01-29 DIAGNOSIS — R238 Other skin changes: Secondary | ICD-10-CM | POA: Diagnosis not present

## 2018-02-06 DIAGNOSIS — H02422 Myogenic ptosis of left eyelid: Secondary | ICD-10-CM | POA: Diagnosis not present

## 2018-02-12 ENCOUNTER — Encounter: Payer: Self-pay | Admitting: Internal Medicine

## 2018-02-12 ENCOUNTER — Other Ambulatory Visit: Payer: Medicare HMO

## 2018-02-12 ENCOUNTER — Ambulatory Visit (INDEPENDENT_AMBULATORY_CARE_PROVIDER_SITE_OTHER): Payer: Medicare HMO | Admitting: Internal Medicine

## 2018-02-12 VITALS — BP 126/78 | HR 74 | Temp 98.0°F | Ht 65.0 in | Wt 153.0 lb

## 2018-02-12 DIAGNOSIS — H40213 Acute angle-closure glaucoma, bilateral: Secondary | ICD-10-CM | POA: Diagnosis not present

## 2018-02-12 DIAGNOSIS — H02403 Unspecified ptosis of bilateral eyelids: Secondary | ICD-10-CM

## 2018-02-12 DIAGNOSIS — F411 Generalized anxiety disorder: Secondary | ICD-10-CM

## 2018-02-12 DIAGNOSIS — R69 Illness, unspecified: Secondary | ICD-10-CM | POA: Diagnosis not present

## 2018-02-12 DIAGNOSIS — H02409 Unspecified ptosis of unspecified eyelid: Secondary | ICD-10-CM | POA: Insufficient documentation

## 2018-02-12 NOTE — Progress Notes (Signed)
Subjective:    Patient ID: Robyn Bailey, female    DOB: 01-27-1941, 77 y.o.   MRN: [id number]  HPI  Here to f/u in general; s/p bilat cataract surgury recent, as well as more remote bilat iridotomies for glaucoma, who has developed subsequent left upper eyelid drooping which is vision affecting, but according to pt after a very brief exam she was though by the plastic surgeon who only deals with eyes, that she would have to look in to possible myasthenia gravis, also right upper eyelid sligghtly droops as well but not as much.  Pt became concerned about the advice for lab work and neurology consultation, as although she was sure the surgeon was probably technically good, she did not get a good "vibe" .  Denies swallowing problem or unusual weakness, but does not think more than her usual fatigue since she is so active.  Called her optho again who offered to refer her to [facility] but she felt this was too far.  Pt denies chest pain, resp weakness leading to increased sob or doe, wheezing, orthopnea, PND, increased LE swelling, palpitations, dizziness or syncope.   Pt denies fever, wt loss, night sweats, loss of appetite, or other constitutional symptoms No recent trauma.   Pt denies new neurological symptoms such as new headache, or facial or extremity weakness or numbness, except for the above.  Denies worsening depressive symptoms, suicidal ideation, or panic; has ongoing anxiety, and mentions she is dealing with a local sister with mental issues, and 2 brothers live out of town and not much help Past Medical History:  Diagnosis Date  . ABNORMAL ELECTROCARDIOGRAM 07/19/2007  . ALLERGIC RHINITIS 07/19/2007  . Anatomical narrow angle glaucoma 04/20/2009  . ANXIETY 07/19/2007  . BACK PAIN 11/08/2009  . Blepharospasm 06/06/2008  . COLONIC POLYPS, HX OF 07/19/2007  . Cough 09/22/2007  . DEPRESSION 07/19/2007  . DIVERTICULOSIS, COLON 07/19/2007  . FATIGUE 04/20/2009  . HYPERLIPIDEMIA 07/19/2007  . Left sided sciatica  01/22/2011  . LOW BACK PAIN 07/19/2007  . OSTEOARTHRITIS 07/19/2007  . OSTEOPOROSIS 07/19/2007  . PERIPHERAL NEUROPATHY 06/08/2007  . RHINITIS 02/27/2009  . SINUSITIS- ACUTE-NOS 11/04/2007  . SORE THROAT 02/27/2009   Past Surgical History:  Procedure Laterality Date  . BREAST BIOPSY    . hammer toe surgury  2003   bilateral  . TONSILLECTOMY      reports that she has quit smoking. She has never used smokeless tobacco. She reports that she drinks alcohol. She reports that she does not use drugs. family history includes Colon cancer in her maternal uncle and mother. Allergies  Allergen Reactions  . Hydrocodone-Acetaminophen    Current Outpatient Medications on File Prior to Visit  Medication Sig Dispense Refill  . alendronate (FOSAMAX) 70 MG tablet Take 1 tablet (70 mg total) by mouth every 7 (seven) days. Take with a full glass of water on an empty stomach. 12 tablet 3  . cetirizine (ZYRTEC) 10 MG tablet Take 1 tablet (10 mg total) by mouth daily. 30 tablet 11  . meloxicam (MOBIC) 15 MG tablet Take 1 tablet (15 mg total) by mouth daily. 90 tablet 3  . OVER THE COUNTER MEDICATION Mucous relief    . pantoprazole (PROTONIX) 40 MG tablet Take 1 tablet (40 mg total) by mouth 2 (two) times daily. 180 tablet 2  . Probiotic Product (ALIGN PO) Take by mouth daily.    Marland Kitchen triamcinolone (NASACORT AQ) 55 MCG/ACT AERO nasal inhaler Place 2 sprays into the nose daily. 1  Inhaler 12   No current facility-administered medications on file prior to visit.    Review of Systems  Constitutional: Negative for other unusual diaphoresis or sweats HENT: Negative for ear discharge or swelling Eyes: Negative for other worsening visual disturbances Respiratory: Negative for stridor or other swelling  Gastrointestinal: Negative for worsening distension or other blood Genitourinary: Negative for retention or other urinary change Musculoskeletal: Negative for other MSK pain or swelling Skin: Negative for color change  or other new lesions Neurological: Negative for worsening tremors and other numbness  Psychiatric/Behavioral: Negative for worsening agitation or other fatigue All other system neg per pt    Objective:   Physical Exam BP 126/78   Pulse 74   Temp 98 F (36.7 C) (Oral)   Ht 5\' 5"  (1.651 m)   Wt 153 lb (69.4 kg)   SpO2 98%   BMI 25.46 kg/m  VS noted, not ill appearing Constitutional: Pt appears in NAD HENT: Head: NCAT.  Right Ear: External ear normal.  Left Ear: External ear normal.  Eyes: . Pupils are equal, round, and reactive to light. Conjunctivae and EOM are normal; does have let > right mild to mod ptosis Nose: without d/c or deformity Neck: Neck supple. Gross normal ROM Cardiovascular: Normal rate and regular rhythm.   Pulmonary/Chest: Effort normal and breath sounds without rales or wheezing.  Abd:  Soft, NT, ND, + BS, no organomegaly Neurological: Pt is alert. At baseline orientation, motor grossly intact Skin: Skin is warm. No rashes, other new lesions, no LE edema Psychiatric: Pt behavior is normal without agitation , 1+ nervous No other exam findings     Assessment & Plan:

## 2018-02-12 NOTE — Assessment & Plan Note (Signed)
I tried to reassure pt with this situational worsening, to cont same tx

## 2018-02-12 NOTE — Assessment & Plan Note (Signed)
Has evidence for ptosis, d/w pt the nature of autoimmune disease in general and myasthenia specifically; pt has little to support this clinically, but seems reasonable for lab work/myasthenia panel and neurology referral, which she accepts today; if evaluation is negative, it would be reasonable to see plastic surgury again, though it seems she will not want to see the recent provider

## 2018-02-12 NOTE — Patient Instructions (Addendum)
Please continue all other medications as before, and refills have been done if requested.  Please have the pharmacy call with any other refills you may need.  Please continue your efforts at being more active, low cholesterol diet, and weight control.  You are otherwise up to date with prevention measures today.  Please keep your appointments with your specialists as you may have planned  You will be contacted regarding the referral for: Neurology  Please go to the LAB in the Basement (turn left off the elevator) for the tests to be done today  You will be contacted by phone if any changes need to be made immediately.  Otherwise, you will receive a letter about your results with an explanation, but please check with MyChart first.  Please remember to sign up for MyChart if you have not done so, as this will be important to you in the future with finding out test results, communicating by private email, and scheduling acute appointments online when needed.

## 2018-02-12 NOTE — Assessment & Plan Note (Signed)
To cont optho f/u closely as she does

## 2018-02-17 ENCOUNTER — Telehealth: Payer: Self-pay | Admitting: Internal Medicine

## 2018-02-17 NOTE — Telephone Encounter (Signed)
Advised pt that results are not back yet from her 5/30 visit but I would call her with the results as soon as we received them. She expressed understanding.

## 2018-02-17 NOTE — Telephone Encounter (Signed)
Copied from [place] [id number]. Topic: Quick Communication - See Telephone Encounter >> Feb 17, 2018 11:45 AM Rutherford Nail, NT wrote: CRM for notification. See Telephone encounter for: 02/17/18. Patient calling to inquire about lab results. Please advise. CB#: [phone]

## 2018-02-18 NOTE — Telephone Encounter (Signed)
Patient calling received lab result on mychart would like to speak with nurse

## 2018-02-18 NOTE — Telephone Encounter (Signed)
Pt has been informed of her results and expressed understand.

## 2018-02-19 LAB — ACETYLCHOLINE RECEPTOR, BINDING: A CHR BINDING ABS: <0.3 nmol/L

## 2018-02-19 LAB — STRIATED MUSCLE ANTIBODY: STRIATED MUSCLE AB SCREEN: NEGATIVE

## 2018-03-31 ENCOUNTER — Ambulatory Visit (INDEPENDENT_AMBULATORY_CARE_PROVIDER_SITE_OTHER): Payer: Medicare HMO | Admitting: Family

## 2018-03-31 ENCOUNTER — Encounter: Payer: Self-pay | Admitting: Family

## 2018-03-31 VITALS — BP 132/84 | HR 89 | Temp 98.3°F | Ht 65.0 in

## 2018-03-31 DIAGNOSIS — J209 Acute bronchitis, unspecified: Secondary | ICD-10-CM

## 2018-03-31 DIAGNOSIS — J019 Acute sinusitis, unspecified: Secondary | ICD-10-CM | POA: Diagnosis not present

## 2018-03-31 MED ORDER — CEFDINIR 300 MG PO CAPS: 300.0000 mg | ORAL_CAPSULE | Freq: Two times a day (BID) | ORAL | 0 refills | Status: DC

## 2018-03-31 MED ORDER — BENZONATATE 100 MG PO CAPS: 100.0000 mg | ORAL_CAPSULE | Freq: Three times a day (TID) | ORAL | 0 refills | Status: DC | PRN

## 2018-03-31 NOTE — Progress Notes (Signed)
Robyn Bailey is a 77 y.o. female with the following history as recorded in EpicCare:  Patient Active Problem List   Diagnosis Date Noted  . Ptosis 02/12/2018  . Palpitations 11/12/2017  . Radiculitis of right cervical region 11/11/2017  . Eustachian tube dysfunction 11/08/2016  . Tinnitus of left ear 09/15/2015  . Insect stings 04/22/2013  . Rash and nonspecific skin eruption 04/22/2013  . GERD (gastroesophageal reflux disease) 09/28/2012  . Left lumbar radiculopathy 02/01/2011  . Left sided sciatica 01/22/2011  . Encounter for well adult exam with abnormal findings 01/21/2011  . Acute angle-closure glaucoma 04/20/2009  . FATIGUE 04/20/2009  . Blepharospasm 06/06/2008  . HLD (hyperlipidemia) 07/19/2007  . Anxiety state 07/19/2007  . Depression 07/19/2007  . Allergic rhinitis 07/19/2007  . DIVERTICULOSIS, COLON 07/19/2007  . OSTEOARTHRITIS 07/19/2007  . LOW BACK PAIN 07/19/2007  . OSTEOPOROSIS 07/19/2007  . ABNORMAL ELECTROCARDIOGRAM 07/19/2007  . MACULAR DEGENERATION, HX OF 07/19/2007  . COLONIC POLYPS, HX OF 07/19/2007  . PERIPHERAL NEUROPATHY 06/08/2007    Current Outpatient Medications  Medication Sig Dispense Refill  . cetirizine (ZYRTEC) 10 MG tablet Take 1 tablet (10 mg total) by mouth daily. 30 tablet 11  . meloxicam (MOBIC) 15 MG tablet Take 1 tablet (15 mg total) by mouth daily. 90 tablet 3  . OVER THE COUNTER MEDICATION Mucous relief    . pantoprazole (PROTONIX) 40 MG tablet Take 1 tablet (40 mg total) by mouth 2 (two) times daily. 180 tablet 2  . Probiotic Product (ALIGN PO) Take by mouth daily.    Marland Kitchen triamcinolone (NASACORT AQ) 55 MCG/ACT AERO nasal inhaler Place 2 sprays into the nose daily. 1 Inhaler 12  . benzonatate (TESSALON) 100 MG capsule Take 1 capsule (100 mg total) by mouth 3 (three) times daily as needed. 20 capsule 0  . cefdinir (OMNICEF) 300 MG capsule Take 1 capsule (300 mg total) by mouth 2 (two) times daily. 20 capsule 0   No current  facility-administered medications for this visit.     Allergies: Hydrocodone-acetaminophen  Past Medical History:  Diagnosis Date  . ABNORMAL ELECTROCARDIOGRAM 07/19/2007  . ALLERGIC RHINITIS 07/19/2007  . Anatomical narrow angle glaucoma 04/20/2009  . ANXIETY 07/19/2007  . BACK PAIN 11/08/2009  . Blepharospasm 06/06/2008  . COLONIC POLYPS, HX OF 07/19/2007  . Cough 09/22/2007  . DEPRESSION 07/19/2007  . DIVERTICULOSIS, COLON 07/19/2007  . FATIGUE 04/20/2009  . HYPERLIPIDEMIA 07/19/2007  . Left sided sciatica 01/22/2011  . LOW BACK PAIN 07/19/2007  . OSTEOARTHRITIS 07/19/2007  . OSTEOPOROSIS 07/19/2007  . PERIPHERAL NEUROPATHY 06/08/2007  . RHINITIS 02/27/2009  . SINUSITIS- ACUTE-NOS 11/04/2007  . SORE THROAT 02/27/2009    Past Surgical History:  Procedure Laterality Date  . BREAST BIOPSY    . hammer toe surgury  2003   bilateral  . TONSILLECTOMY      Family History  Problem Relation Age of Onset  . Colon cancer Mother   . Colon cancer Maternal Uncle     Social History   Tobacco Use  . Smoking status: Former Research scientist (life sciences)  . Smokeless tobacco: Never Used  Substance Use Topics  . Alcohol use: Yes    Alcohol/week: 0.0 oz    Comment: occasional glass of wine; rare now b/c acid reflux    Subjective:  Patient presents with 3 day history of cough/ congestion; notes doesn't remember feeling this bad in a long time; using OTC Mucinex and Nasacort with some relief of symptoms; +facial pain/ pressure;    Objective:  Vitals:  03/31/18 1316  BP: 132/84  Pulse: 89  Temp: 98.3 F (36.8 C)  TempSrc: Oral  SpO2: 96%  Height: 5\' 5"  (1.651 m)    General: Well developed, well nourished, in no acute distress  Skin : Warm and dry.  Head: Normocephalic and atraumatic  Eyes: Sclera and conjunctiva clear; pupils round and reactive to light; extraocular movements intact  Ears: External normal; canals clear; tympanic membranes congested/ erythematous  Oropharynx: Pink, supple. No suspicious lesions   Neck: Supple without thyromegaly, adenopathy  Lungs: Respirations unlabored; wheezing noted on exam CVS exam: normal rate and regular rhythm.  Neurologic: Alert and oriented; speech intact; face symmetrical; moves all extremities well; CNII-XII intact without focal deficit   Assessment:  1. Acute sinusitis, recurrence not specified, unspecified location   2. Acute bronchitis, unspecified organism     Plan:  Rx for Omnicef 300 mg bid x 10 days; sample of BREO 100 qd x 7-10days; Rx for Tessalon Perles 100 mg tid prn; continue Nasacort AQ; increase fluids, rest and follow-up worse, no better.   No follow-ups on file.  No orders of the defined types were placed in this encounter.   Requested Prescriptions   Signed Prescriptions Disp Refills  . cefdinir (OMNICEF) 300 MG capsule 20 capsule 0    Sig: Take 1 capsule (300 mg total) by mouth 2 (two) times daily.  . benzonatate (TESSALON) 100 MG capsule 20 capsule 0    Sig: Take 1 capsule (100 mg total) by mouth 3 (three) times daily as needed.

## 2018-04-03 ENCOUNTER — Ambulatory Visit (INDEPENDENT_AMBULATORY_CARE_PROVIDER_SITE_OTHER)
Admission: RE | Admit: 2018-04-03 | Discharge: 2018-04-03 | Disposition: A | Discharge status: Home / Self Care | Payer: Medicare HMO | Source: Ambulatory Visit | Attending: Family | Admitting: Family

## 2018-04-03 ENCOUNTER — Encounter: Payer: Self-pay | Admitting: Family

## 2018-04-03 ENCOUNTER — Ambulatory Visit (INDEPENDENT_AMBULATORY_CARE_PROVIDER_SITE_OTHER): Payer: Medicare HMO | Admitting: Family

## 2018-04-03 VITALS — BP 124/76 | HR 71 | Temp 97.7°F | Ht 65.0 in | Wt 147.0 lb

## 2018-04-03 DIAGNOSIS — R05 Cough: Secondary | ICD-10-CM

## 2018-04-03 DIAGNOSIS — R0602 Shortness of breath: Secondary | ICD-10-CM | POA: Diagnosis not present

## 2018-04-03 DIAGNOSIS — R059 Cough, unspecified: Secondary | ICD-10-CM

## 2018-04-03 MED ORDER — PREDNISONE 10 MG PO TABS: ORAL_TABLET | ORAL | 0 refills | Status: DC

## 2018-04-03 MED ORDER — DOXYCYCLINE HYCLATE 100 MG PO TABS: 100.0000 mg | ORAL_TABLET | Freq: Two times a day (BID) | ORAL | 0 refills | Status: DC

## 2018-04-03 NOTE — Progress Notes (Signed)
Robyn Bailey is a 77 y.o. female with the following history as recorded in EpicCare:  Patient Active Problem List   Diagnosis Date Noted  . Ptosis 02/12/2018  . Palpitations 11/12/2017  . Radiculitis of right cervical region 11/11/2017  . Eustachian tube dysfunction 11/08/2016  . Tinnitus of left ear 09/15/2015  . Insect stings 04/22/2013  . Rash and nonspecific skin eruption 04/22/2013  . GERD (gastroesophageal reflux disease) 09/28/2012  . Left lumbar radiculopathy 02/01/2011  . Left sided sciatica 01/22/2011  . Encounter for well adult exam with abnormal findings 01/21/2011  . Acute angle-closure glaucoma 04/20/2009  . FATIGUE 04/20/2009  . Blepharospasm 06/06/2008  . HLD (hyperlipidemia) 07/19/2007  . Anxiety state 07/19/2007  . Depression 07/19/2007  . Allergic rhinitis 07/19/2007  . DIVERTICULOSIS, COLON 07/19/2007  . OSTEOARTHRITIS 07/19/2007  . LOW BACK PAIN 07/19/2007  . OSTEOPOROSIS 07/19/2007  . ABNORMAL ELECTROCARDIOGRAM 07/19/2007  . MACULAR DEGENERATION, HX OF 07/19/2007  . COLONIC POLYPS, HX OF 07/19/2007  . PERIPHERAL NEUROPATHY 06/08/2007    Current Outpatient Medications  Medication Sig Dispense Refill  . benzonatate (TESSALON) 100 MG capsule Take 1 capsule (100 mg total) by mouth 3 (three) times daily as needed. 20 capsule 0  . cefdinir (OMNICEF) 300 MG capsule Take 1 capsule (300 mg total) by mouth 2 (two) times daily. 20 capsule 0  . cetirizine (ZYRTEC) 10 MG tablet Take 1 tablet (10 mg total) by mouth daily. 30 tablet 11  . meloxicam (MOBIC) 15 MG tablet Take 1 tablet (15 mg total) by mouth daily. 90 tablet 3  . OVER THE COUNTER MEDICATION Mucous relief    . pantoprazole (PROTONIX) 40 MG tablet Take 1 tablet (40 mg total) by mouth 2 (two) times daily. 180 tablet 2  . Probiotic Product (ALIGN PO) Take by mouth daily.    Marland Kitchen triamcinolone (NASACORT AQ) 55 MCG/ACT AERO nasal inhaler Place 2 sprays into the nose daily. 1 Inhaler 12  . doxycycline  (VIBRA-TABS) 100 MG tablet Take 1 tablet (100 mg total) by mouth 2 (two) times daily. 20 tablet 0  . predniSONE (DELTASONE) 10 MG tablet 3 tabs by mouth x 3 days; 2 tabs by mouth x 3 days, 1 tab by mouth x 3 days; 18 tablet 0   No current facility-administered medications for this visit.     Allergies: Hydrocodone-acetaminophen  Past Medical History:  Diagnosis Date  . ABNORMAL ELECTROCARDIOGRAM 07/19/2007  . ALLERGIC RHINITIS 07/19/2007  . Anatomical narrow angle glaucoma 04/20/2009  . ANXIETY 07/19/2007  . BACK PAIN 11/08/2009  . Blepharospasm 06/06/2008  . COLONIC POLYPS, HX OF 07/19/2007  . Cough 09/22/2007  . DEPRESSION 07/19/2007  . DIVERTICULOSIS, COLON 07/19/2007  . FATIGUE 04/20/2009  . HYPERLIPIDEMIA 07/19/2007  . Left sided sciatica 01/22/2011  . LOW BACK PAIN 07/19/2007  . OSTEOARTHRITIS 07/19/2007  . OSTEOPOROSIS 07/19/2007  . PERIPHERAL NEUROPATHY 06/08/2007  . RHINITIS 02/27/2009  . SINUSITIS- ACUTE-NOS 11/04/2007  . SORE THROAT 02/27/2009    Past Surgical History:  Procedure Laterality Date  . BREAST BIOPSY    . hammer toe surgury  2003   bilateral  . TONSILLECTOMY      Family History  Problem Relation Age of Onset  . Colon cancer Mother   . Colon cancer Maternal Uncle     Social History   Tobacco Use  . Smoking status: Former Research scientist (life sciences)  . Smokeless tobacco: Never Used  Substance Use Topics  . Alcohol use: Yes    Alcohol/week: 0.0 oz    Comment:  occasional glass of wine; rare now b/c acid reflux    Subjective:  2 day follow-up on recent diagnosis of acute sinusitis/ acute bronchitis; admits that she is better but concerned about persisting "bad cough." Was recently exposed to a friend who has the flu; Denies any chest pain or shortness of breath; no fever; + wheezing even with BREO daily;     Objective:  Vitals:   04/03/18 1413  BP: 124/76  Pulse: 71  Temp: 97.7 F (36.5 C)  TempSrc: Oral  SpO2: 93%  Weight: 147 lb 0.6 oz (66.7 kg)  Height: 5\' 5"  (1.651 m)     General: Well developed, well nourished, in no acute distress  Skin : Warm and dry.  Head: Normocephalic and atraumatic  Eyes: Sclera and conjunctiva clear; pupils round and reactive to light; extraocular movements intact  Ears: External normal; canals clear; tympanic membranes congested bilaterally Oropharynx: Pink, supple. No suspicious lesions  Neck: Supple without thyromegaly, adenopathy  Lungs: Respirations unlabored; wheezing in upper lobes  CVS exam: normal rate and regular rhythm.  Neurologic: Alert and oriented; speech intact; face symmetrical; moves all extremities well; CNII-XII intact without focal deficit  Assessment:  1. Cough     Plan:  STAT X-ray shows bronchitis; d/c Omnicef- change to Doxycyline 100 mg bid x 10 days; Rx for Prednisone 10 mg- taper as directed; increase fluids, rest and follow-up worse, no better.   No follow-ups on file.  Orders Placed This Encounter  Procedures  . DG Chest 2 View    Standing Status:   Future    Number of Occurrences:   1    Standing Expiration Date:   06/05/2019    Order Specific Question:   Reason for Exam (SYMPTOM  OR DIAGNOSIS REQUIRED)    Answer:   cough    Order Specific Question:   Preferred imaging location?    Answer:   [facility]    Order Specific Question:   Radiology Contrast Protocol - do NOT remove file path    Answer:   [email or web address]    Requested Prescriptions   Signed Prescriptions Disp Refills  . doxycycline (VIBRA-TABS) 100 MG tablet 20 tablet 0    Sig: Take 1 tablet (100 mg total) by mouth 2 (two) times daily.  . predniSONE (DELTASONE) 10 MG tablet 18 tablet 0    Sig: 3 tabs by mouth x 3 days; 2 tabs by mouth x 3 days, 1 tab by mouth x 3 days;

## 2018-04-03 NOTE — Progress Notes (Signed)
Reviewed with patient in office;

## 2018-04-08 DIAGNOSIS — H02403 Unspecified ptosis of bilateral eyelids: Secondary | ICD-10-CM | POA: Diagnosis not present

## 2018-04-17 DIAGNOSIS — H02403 Unspecified ptosis of bilateral eyelids: Secondary | ICD-10-CM | POA: Diagnosis not present

## 2018-05-28 ENCOUNTER — Other Ambulatory Visit: Payer: Self-pay

## 2018-05-28 ENCOUNTER — Encounter (HOSPITAL_BASED_OUTPATIENT_CLINIC_OR_DEPARTMENT_OTHER): Payer: Self-pay | Admitting: *Deleted

## 2018-06-05 ENCOUNTER — Ambulatory Visit (HOSPITAL_BASED_OUTPATIENT_CLINIC_OR_DEPARTMENT_OTHER): Admission: RE | Admit: 2018-06-05 | Payer: Medicare HMO | Source: Ambulatory Visit | Admitting: Plastic Surgery

## 2018-06-05 SURGERY — REPAIR, BLEPHAROPTOSIS
Anesthesia: Monitor Anesthesia Care | Laterality: Bilateral

## 2018-08-11 ENCOUNTER — Other Ambulatory Visit: Payer: Self-pay | Admitting: Internal Medicine

## 2018-08-11 MED ORDER — PANTOPRAZOLE SODIUM 40 MG PO TBEC: 40.0000 mg | DELAYED_RELEASE_TABLET | Freq: Two times a day (BID) | ORAL | 0 refills | Status: DC

## 2018-08-11 NOTE — Telephone Encounter (Signed)
Copied from [place] [id number]. Topic: Quick Communication - Rx Refill/Question >> Aug 11, 2018  8:20 AM Burchel, Abbi R wrote: Medication: pantoprazole (PROTONIX) 40 MG tablet   Preferred [facility]acy : [facility] [address], [place] [address] Phone: [phone] Fax: [phone]    Pt was advised that RX refills may take up to 3 business days. We ask that you follow-up with your pharmacy.

## 2018-10-01 DIAGNOSIS — H02403 Unspecified ptosis of bilateral eyelids: Secondary | ICD-10-CM | POA: Diagnosis not present

## 2018-10-01 NOTE — H&P (Signed)
Subjective:     Patient ID: Robyn Bailey is a 78 y.o. female.  Follow-up   Here for follow up discussion prior to planned correction bilateral ptosis upper lids. VFE demonstrated bilateral obstruction or points of visual loss inside the 25-degree circle of the superior field. This was corrected when taped with improvement in the superior field with no visual loss inside the 40-degree circle of the superior field. Denies dry eyes. Notes history iridotomy for glaucoma and bilateral cataract surgery this year. Notes light sensitivity continues post cataract surgery. Wears glasses. Prior work up for myasthenia was negative.  Lives alone, but reports has friend that can stay with her post op.   Review of Systems    Objective:   Physical Exam  Constitutional: She is oriented to person, place, and time.  Cardiovascular: Normal rate, regular rhythm and normal heart sounds.  Pulmonary/Chest: Effort normal and breath sounds normal.  Neurological: She is alert and oriented to person, place, and time.  HEENT: with frontalis blocked, Left upper lid ptosis 2 mm (MRD1 1 mm), right upper lid ptosis 1 mm  (MRD1 2 mm) Static and dynamic frontalis rhytids Levator excursion  10 mm bilateral +Bells     Assessment:     Eyelid ptosis bilateral upper lids    Plan:     Plan bilateral upper eyelid ptosis repair. Reviewed external approach with upper lid incision, levator plication. Reviewed bruising, sutures, dry eyes post operatively common, may not be able to close eyes for first week following surgery. Discussed with ptosis most common risk is asymmetry, need for revision. May require massage for overcorrection.  Additional risks anesthesia, damage to adjacent structures, need for additional procedures, unacceptable cosmetic result, bruising, recurrence ptosis, DVT/PE cardiopulmonary complications.  Discussed surgery under sedation. Counseled I am unable to perform surgery under local  anesthesia. Discussed she will have to stay overnight in surgery center unless she has adult to stay with following sedation. She states she does need to spend night in surgery center and friend told her unable to stay with her.    Rx - she received Norco prior to previously scheduled surgery; we discussed today that this is listed as allergy for her. She states she is able to take but makes her head swim, declined different Rx.  Irene Limbo, MD [facility] [phone], pin [phone]

## 2018-10-02 ENCOUNTER — Encounter (HOSPITAL_BASED_OUTPATIENT_CLINIC_OR_DEPARTMENT_OTHER): Payer: Self-pay | Admitting: *Deleted

## 2018-10-02 ENCOUNTER — Other Ambulatory Visit: Payer: Self-pay

## 2018-10-12 ENCOUNTER — Ambulatory Visit (HOSPITAL_BASED_OUTPATIENT_CLINIC_OR_DEPARTMENT_OTHER): Payer: Medicare HMO | Admitting: Anesthesiology

## 2018-10-12 ENCOUNTER — Encounter (HOSPITAL_BASED_OUTPATIENT_CLINIC_OR_DEPARTMENT_OTHER)
Admission: RE | Disposition: A | Discharge status: Home / Self Care | Payer: Self-pay | Source: Home / Self Care | Attending: Plastic Surgery

## 2018-10-12 ENCOUNTER — Encounter (HOSPITAL_BASED_OUTPATIENT_CLINIC_OR_DEPARTMENT_OTHER): Payer: Self-pay | Admitting: Anesthesiology

## 2018-10-12 ENCOUNTER — Ambulatory Visit (HOSPITAL_BASED_OUTPATIENT_CLINIC_OR_DEPARTMENT_OTHER)
Admission: RE | Admit: 2018-10-12 | Discharge: 2018-10-13 | Disposition: A | Discharge status: Home / Self Care | Payer: Medicare HMO | Attending: Plastic Surgery | Admitting: Plastic Surgery

## 2018-10-12 DIAGNOSIS — K219 Gastro-esophageal reflux disease without esophagitis: Secondary | ICD-10-CM | POA: Insufficient documentation

## 2018-10-12 DIAGNOSIS — Z79899 Other long term (current) drug therapy: Secondary | ICD-10-CM | POA: Insufficient documentation

## 2018-10-12 DIAGNOSIS — R69 Illness, unspecified: Secondary | ICD-10-CM | POA: Diagnosis not present

## 2018-10-12 DIAGNOSIS — E785 Hyperlipidemia, unspecified: Secondary | ICD-10-CM | POA: Diagnosis not present

## 2018-10-12 DIAGNOSIS — H02403 Unspecified ptosis of bilateral eyelids: Secondary | ICD-10-CM | POA: Diagnosis not present

## 2018-10-12 DIAGNOSIS — Z87891 Personal history of nicotine dependence: Secondary | ICD-10-CM | POA: Insufficient documentation

## 2018-10-12 HISTORY — DX: Unspecified ptosis of bilateral eyelids: H02.403

## 2018-10-12 HISTORY — DX: Gastro-esophageal reflux disease without esophagitis: K21.9

## 2018-10-12 HISTORY — PX: PTOSIS REPAIR: SHX6568

## 2018-10-12 SURGERY — REPAIR, BLEPHAROPTOSIS
Anesthesia: General | Site: Eye | Laterality: Bilateral

## 2018-10-12 MED ORDER — ARTIFICIAL TEARS OPHTHALMIC OINT
TOPICAL_OINTMENT | OPHTHALMIC | Status: AC
  Filled 2018-10-12: qty 3.5

## 2018-10-12 MED ORDER — DEXAMETHASONE SODIUM PHOSPHATE 4 MG/ML IJ SOLN
INTRAMUSCULAR | Status: DC | PRN
  Administered 2018-10-12: 10 mg via INTRAVENOUS

## 2018-10-12 MED ORDER — TOBRAMYCIN-DEXAMETHASONE 0.3-0.1 % OP OINT
TOPICAL_OINTMENT | Freq: Every day | OPHTHALMIC | Status: DC
  Administered 2018-10-12: 21:00:00 via OPHTHALMIC

## 2018-10-12 MED ORDER — TOBRAMYCIN-DEXAMETHASONE 0.3-0.1 % OP OINT
TOPICAL_OINTMENT | OPHTHALMIC | Status: DC | PRN
  Administered 2018-10-12: 1 via OPHTHALMIC

## 2018-10-12 MED ORDER — LIDOCAINE 2% (20 MG/ML) 5 ML SYRINGE
INTRAMUSCULAR | Status: AC
  Filled 2018-10-12: qty 5

## 2018-10-12 MED ORDER — SCOPOLAMINE 1 MG/3DAYS TD PT72: 1.0000 | MEDICATED_PATCH | Freq: Once | TRANSDERMAL | Status: DC | PRN

## 2018-10-12 MED ORDER — ONDANSETRON HCL 4 MG/2ML IJ SOLN: 4.0000 mg | Freq: Once | INTRAMUSCULAR | Status: DC | PRN

## 2018-10-12 MED ORDER — ONDANSETRON HCL 4 MG/2ML IJ SOLN
INTRAMUSCULAR | Status: AC
  Filled 2018-10-12: qty 2

## 2018-10-12 MED ORDER — LACTATED RINGERS IV SOLN
INTRAVENOUS | Status: DC
  Administered 2018-10-12 (×2): via INTRAVENOUS

## 2018-10-12 MED ORDER — TOBRAMYCIN-DEXAMETHASONE 0.3-0.1 % OP OINT
TOPICAL_OINTMENT | OPHTHALMIC | Status: AC
  Filled 2018-10-12: qty 3.5

## 2018-10-12 MED ORDER — ONDANSETRON HCL 4 MG/2ML IJ SOLN
INTRAMUSCULAR | Status: DC | PRN
  Administered 2018-10-12: 4 mg via INTRAVENOUS

## 2018-10-12 MED ORDER — EPHEDRINE SULFATE 50 MG/ML IJ SOLN
INTRAMUSCULAR | Status: DC | PRN
  Administered 2018-10-12 (×2): 10 mg via INTRAVENOUS

## 2018-10-12 MED ORDER — PHENYLEPHRINE HCL 10 MG/ML IJ SOLN
INTRAMUSCULAR | Status: AC
  Filled 2018-10-12: qty 1

## 2018-10-12 MED ORDER — HYDROCODONE-ACETAMINOPHEN 5-325 MG PO TABS: 1.0000 | ORAL_TABLET | ORAL | Status: DC | PRN

## 2018-10-12 MED ORDER — BSS IO SOLN
INTRAOCULAR | Status: AC
  Filled 2018-10-12: qty 15

## 2018-10-12 MED ORDER — LIDOCAINE-EPINEPHRINE 1 %-1:100000 IJ SOLN
INTRAMUSCULAR | Status: DC | PRN
  Administered 2018-10-12: 3 mL

## 2018-10-12 MED ORDER — SODIUM CHLORIDE 0.9 % IV SOLN
INTRAVENOUS | Status: DC | PRN
  Administered 2018-10-12: 25 ug/min via INTRAVENOUS

## 2018-10-12 MED ORDER — FENTANYL CITRATE (PF) 100 MCG/2ML IJ SOLN
INTRAMUSCULAR | Status: DC | PRN
  Administered 2018-10-12: 100 ug via INTRAVENOUS

## 2018-10-12 MED ORDER — TRAMADOL HCL 50 MG PO TABS
50.0000 mg | ORAL_TABLET | Freq: Four times a day (QID) | ORAL | Status: DC | PRN
  Administered 2018-10-12: 50 mg via ORAL
  Filled 2018-10-12: qty 1

## 2018-10-12 MED ORDER — FENTANYL CITRATE (PF) 100 MCG/2ML IJ SOLN: 50.0000 ug | INTRAMUSCULAR | Status: DC | PRN

## 2018-10-12 MED ORDER — TRIAMCINOLONE ACETONIDE 55 MCG/ACT NA AERO: 2.0000 | INHALATION_SPRAY | Freq: Every day | NASAL | Status: DC

## 2018-10-12 MED ORDER — CEFAZOLIN SODIUM 1 G IJ SOLR
INTRAMUSCULAR | Status: AC
  Filled 2018-10-12: qty 10

## 2018-10-12 MED ORDER — PROPOFOL 10 MG/ML IV BOLUS
INTRAVENOUS | Status: DC | PRN
  Administered 2018-10-12: 150 mg via INTRAVENOUS

## 2018-10-12 MED ORDER — DEXAMETHASONE SODIUM PHOSPHATE 10 MG/ML IJ SOLN
INTRAMUSCULAR | Status: AC
  Filled 2018-10-12: qty 1

## 2018-10-12 MED ORDER — PROPOFOL 10 MG/ML IV BOLUS
INTRAVENOUS | Status: AC
  Filled 2018-10-12: qty 20

## 2018-10-12 MED ORDER — FENTANYL CITRATE (PF) 100 MCG/2ML IJ SOLN
INTRAMUSCULAR | Status: AC
  Filled 2018-10-12: qty 2

## 2018-10-12 MED ORDER — PHENYLEPHRINE HCL 10 MG/ML IJ SOLN
INTRAMUSCULAR | Status: DC | PRN
  Administered 2018-10-12 (×3): 80 ug via INTRAVENOUS

## 2018-10-12 MED ORDER — FENTANYL CITRATE (PF) 100 MCG/2ML IJ SOLN: 25.0000 ug | INTRAMUSCULAR | Status: DC | PRN

## 2018-10-12 MED ORDER — MIDAZOLAM HCL 2 MG/2ML IJ SOLN: 1.0000 mg | INTRAMUSCULAR | Status: DC | PRN

## 2018-10-12 MED ORDER — ONDANSETRON 4 MG PO TBDP: 4.0000 mg | ORAL_TABLET | Freq: Four times a day (QID) | ORAL | Status: DC | PRN

## 2018-10-12 MED ORDER — CEFAZOLIN SODIUM-DEXTROSE 2-4 GM/100ML-% IV SOLN
INTRAVENOUS | Status: AC
  Filled 2018-10-12: qty 100

## 2018-10-12 MED ORDER — CEFAZOLIN SODIUM-DEXTROSE 2-4 GM/100ML-% IV SOLN
2.0000 g | INTRAVENOUS | Status: AC
  Administered 2018-10-12: 2 g via INTRAVENOUS

## 2018-10-12 MED ORDER — ONDANSETRON HCL 4 MG/2ML IJ SOLN: 4.0000 mg | Freq: Four times a day (QID) | INTRAMUSCULAR | Status: DC | PRN

## 2018-10-12 MED ORDER — LIDOCAINE HCL (CARDIAC) PF 100 MG/5ML IV SOSY
PREFILLED_SYRINGE | INTRAVENOUS | Status: DC | PRN
  Administered 2018-10-12: 100 mg via INTRAVENOUS

## 2018-10-12 MED ORDER — PANTOPRAZOLE SODIUM 40 MG PO TBEC
40.0000 mg | DELAYED_RELEASE_TABLET | Freq: Two times a day (BID) | ORAL | Status: DC
  Filled 2018-10-12: qty 1

## 2018-10-12 MED ORDER — DEXTROSE-NACL 5-0.45 % IV SOLN
INTRAVENOUS | Status: DC
  Administered 2018-10-12: 12:00:00 via INTRAVENOUS

## 2018-10-12 SURGICAL SUPPLY — 35 items
BLADE SURG 15 STRL LF DISP TIS (BLADE) ×1 IMPLANT
BLADE SURG 15 STRL SS (BLADE) ×6
COVER BACK TABLE 60X90IN (DRAPES) ×3 IMPLANT
COVER MAYO STAND STRL (DRAPES) ×3 IMPLANT
COVER WAND RF STERILE (DRAPES) IMPLANT
DRAPE U-SHAPE 76X120 STRL (DRAPES) ×3 IMPLANT
ELECT NEEDLE BLADE 2-5/6 (NEEDLE) ×3 IMPLANT
ELECT REM PT RETURN 9FT ADLT (ELECTROSURGICAL) ×3
ELECTRODE REM PT RTRN 9FT ADLT (ELECTROSURGICAL) ×1 IMPLANT
GLOVE BIO SURGEON STRL SZ 6 (GLOVE) ×3 IMPLANT
GLOVE BIO SURGEON STRL SZ7 (GLOVE) ×2 IMPLANT
GLOVE BIOGEL PI IND STRL 7.0 (GLOVE) IMPLANT
GLOVE BIOGEL PI IND STRL 7.5 (GLOVE) IMPLANT
GLOVE BIOGEL PI INDICATOR 7.0 (GLOVE) ×2
GLOVE BIOGEL PI INDICATOR 7.5 (GLOVE) ×2
GOWN STRL REUS W/ TWL LRG LVL3 (GOWN DISPOSABLE) ×2 IMPLANT
GOWN STRL REUS W/ TWL XL LVL3 (GOWN DISPOSABLE) ×1 IMPLANT
GOWN STRL REUS W/TWL LRG LVL3 (GOWN DISPOSABLE) ×3
GOWN STRL REUS W/TWL XL LVL3 (GOWN DISPOSABLE) ×3
NEEDLE HYPO 30GX1 BEV (NEEDLE) ×3 IMPLANT
PACK BASIN DAY SURGERY FS (CUSTOM PROCEDURE TRAY) ×3 IMPLANT
PENCIL BUTTON HOLSTER BLD 10FT (ELECTRODE) ×3 IMPLANT
SHEILD EYE MED CORNL SHD 22X21 (OPHTHALMIC RELATED) ×6
SHIELD EYE MED CORNL SHD 22X21 (OPHTHALMIC RELATED) ×2 IMPLANT
SLEEVE SCD COMPRESS KNEE MED (MISCELLANEOUS) ×3 IMPLANT
STAPLER VISISTAT 35W (STAPLE) ×3 IMPLANT
SUT MERSILENE 5 0 P 3 (SUTURE) ×4 IMPLANT
SUT MERSILENE 5-0 (SUTURE) IMPLANT
SUT PLAIN 5 0 P 3 18 (SUTURE) ×1 IMPLANT
SUT PROLENE 6 0 P 1 18 (SUTURE) ×2 IMPLANT
SUT VIC AB 5-0 P-3 18X BRD (SUTURE) IMPLANT
SUT VIC AB 5-0 P3 18 (SUTURE) ×3
SYR CONTROL 10ML LL (SYRINGE) ×3 IMPLANT
TOWEL GREEN STERILE FF (TOWEL DISPOSABLE) ×3 IMPLANT
TRAY DSU PREP LF (CUSTOM PROCEDURE TRAY) ×3 IMPLANT

## 2018-10-12 NOTE — Op Note (Signed)
Operative Note   DATE OF OPERATION: 1.27.20  LOCATION: [facility]-observation  SURGICAL DIVISION: Plastic Surgery  PREOPERATIVE DIAGNOSES:  Bilateral upper eyelid ptosis  POSTOPERATIVE DIAGNOSES:  same  PROCEDURE:  Bilateral upper eyelid ptosis repair external approach suture technique  SURGEON: Irene Limbo MD MBA  ASSISTANT: none  ANESTHESIA:  General.   EBL: minimal  COMPLICATIONS: None immediate.   INDICATIONS FOR PROCEDURE:  The patient, Robyn Bailey, is a 78 y.o. female born on Jul 02, 1941, is here for treatment bilateral upper eyelid ptosis with associated visual field defect.   FINDINGS: Bilateral upper eyelid ptosis present left greater than right with normal levator excursion.  DESCRIPTION OF PROCEDURE:  The patient was taken to the operating room. SCDs were placed and IV antibiotics were given. Ophthalmic lubricant and scleral shields placed. The patient's operative site was prepped and draped in a sterile fashion. A time out was performed and all information was confirmed to be correct. The area of skin resection marked bilateral with caudal incision 7 mm from lash line. Cephalic border skin resection marked at junction with brow skin, resection limited medially to 5 mm. Local anesthetic infiltrated in areas skin resection and to perform bilateral supraorbital nerve blocks. I began on right side. Sharp excision of marked upper eyelid skin completed. Dissection completed through orbicularis oculi muscle completed to identify cephalic border tarsal plate. Sub orbicularis dissection then completed superiorly to expose levator aponeurosis. 4-0 mersilene suture placed at mid pupillary line from caudal tarsal plate to levator aponeurosis. Additional sutures place at medial and lateral limbus from tarsal plate to levator aponeurosis. Scleral shield removed and eyelid contour and position checked. Scleral shield replaced.  I then directed attention to left upper eyelid.  Skin excised and tarsal plate, levator aponeurosis exposed. 0 mersilene suture placed at mid pupillary line from caudal tarsal plate to levator aponeurosis. Additional sutures place at medial and lateral limbus from tarsal plate to levator aponeurosis. Scleral shield removed and eyelid contour and position checked. Scleral shield replaced. Skin closure completed bilateral with 6-0 prolene running. Scleral shields removed and eyes irrigated with basic salt solution. Tobradex ophthalmic applied in each eye.   The patient was allowed to wake from anesthesia, extubated and taken to the recovery room in satisfactory condition.   SPECIMENS: none  DRAINS: none  Irene Limbo, MD [facility]astic [facility] Surgery [phone], pin [phone]

## 2018-10-12 NOTE — Anesthesia Procedure Notes (Signed)
Procedure Name: LMA Insertion Date/Time: 10/12/2018 9:26 AM Performed by: Marrianne Mood, CRNA Pre-anesthesia Checklist: Patient identified, Emergency Drugs available, Suction available and Patient being monitored Patient Re-evaluated:Patient Re-evaluated prior to induction Oxygen Delivery Method: Circle system utilized Preoxygenation: Pre-oxygenation with 100% oxygen Induction Type: IV induction Ventilation: Mask ventilation without difficulty LMA: LMA inserted LMA Size: 4.0 Grade View: Grade I Number of attempts: 1 Airway Equipment and Method: Bite block Placement Confirmation: positive ETCO2 Tube secured with: Tape Dental Injury: Teeth and Oropharynx as per pre-operative assessment

## 2018-10-12 NOTE — Anesthesia Preprocedure Evaluation (Signed)
Anesthesia Evaluation  Patient identified by MRN, date of birth, ID band Patient awake    Reviewed: Allergy & Precautions, NPO status , Patient's Chart, lab work & pertinent test results  Airway Mallampati: II  TM Distance: >3 FB Neck ROM: Full    Dental  (+) Teeth Intact, Dental Advisory Given   Pulmonary former smoker,    Pulmonary exam normal breath sounds clear to auscultation       Cardiovascular Exercise Tolerance: Good negative cardio ROS Normal cardiovascular exam Rhythm:Regular Rate:Normal     Neuro/Psych PSYCHIATRIC DISORDERS Anxiety Depression  Neuromuscular disease    GI/Hepatic Neg liver ROS, GERD  Medicated,  Endo/Other  negative endocrine ROS  Renal/GU negative Renal ROS     Musculoskeletal  (+) Arthritis ,   Abdominal   Peds  Hematology negative hematology ROS (+)   Anesthesia Other Findings Day of surgery medications reviewed with the patient.   ptosis bilateral upper eyelids  Reproductive/Obstetrics                             Anesthesia Physical Anesthesia Plan  ASA: II  Anesthesia Plan: General   Post-op Pain Management:    Induction: Intravenous  PONV Risk Score and Plan: 3 and Dexamethasone and Ondansetron  Airway Management Planned: LMA  Additional Equipment:   Intra-op Plan:   Post-operative Plan: Extubation in OR  Informed Consent: I have reviewed the patients History and Physical, chart, labs and discussed the procedure including the risks, benefits and alternatives for the proposed anesthesia with the patient or authorized representative who has indicated his/her understanding and acceptance.     Dental advisory given  Plan Discussed with: CRNA  Anesthesia Plan Comments:        Anesthesia Quick Evaluation

## 2018-10-12 NOTE — Anesthesia Postprocedure Evaluation (Signed)
Anesthesia Post Note  Patient: Margrit Minner  Procedure(s) Performed: Bilateral ptosis eyelid correction external approach suture technique (Bilateral Eye)     Patient location during evaluation: PACU Anesthesia Type: General Level of consciousness: awake and alert Pain management: pain level controlled Vital Signs Assessment: post-procedure vital signs reviewed and stable Respiratory status: spontaneous breathing, nonlabored ventilation, respiratory function stable and patient connected to nasal cannula oxygen Cardiovascular status: blood pressure returned to baseline and stable Postop Assessment: no apparent nausea or vomiting Anesthetic complications: no    Last Vitals:  Vitals:   10/12/18 1141 10/12/18 1150  BP: 128/79 129/90  Pulse: (!) 102 (!) 106  Resp: 11 16  Temp:  36.8 C  SpO2: 99% 100%    Last Pain:  Vitals:   10/12/18 1150  PainSc: 0-No pain                  L 

## 2018-10-12 NOTE — Interval H&P Note (Signed)
History and Physical Interval Note:  10/12/2018 8:42 AM  Robyn Bailey  has presented today for surgery, with the diagnosis of ptosis bilateral upper eyelids  The various methods of treatment have been discussed with the patient and family. After consideration of risks, benefits and other options for treatment, the patient has consented to  Procedure(s): bilateral ptosis eyelid correction external approach suture technique (Bilateral) as a surgical intervention .  The patient's history has been reviewed, patient examined, no change in status, stable for surgery.  I have reviewed the patient's chart and labs.  Questions were answered to the patient's satisfaction.     Arnoldo Hooker 

## 2018-10-12 NOTE — Discharge Instructions (Signed)

## 2018-10-12 NOTE — Transfer of Care (Signed)
Immediate Anesthesia Transfer of Care Note  Patient: Robyn Bailey  Procedure(s) Performed: Bilateral ptosis eyelid correction external approach suture technique (Bilateral Eye)  Patient Location: PACU  Anesthesia Type:General  Level of Consciousness: sedated  Airway & Oxygen Therapy: Patient Spontanous Breathing and Patient connected to face mask oxygen  Post-op Assessment: Report given to RN and Post -op Vital signs reviewed and stable  Post vital signs: Reviewed and stable  Last Vitals:  Vitals Value Taken Time  BP 133/70 10/12/2018 10:51 AM  Temp    Pulse 104 10/12/2018 10:52 AM  Resp 15 10/12/2018 10:52 AM  SpO2 97 % 10/12/2018 10:52 AM  Vitals shown include unvalidated device data.  Last Pain:  Vitals:   10/12/18 0811  PainSc: 0-No pain      Patients Stated Pain Goal: 4 (01/65/80 0634)  Complications: No apparent anesthesia complications

## 2018-10-13 ENCOUNTER — Encounter (HOSPITAL_BASED_OUTPATIENT_CLINIC_OR_DEPARTMENT_OTHER): Payer: Self-pay | Admitting: Plastic Surgery

## 2018-10-13 DIAGNOSIS — Z87891 Personal history of nicotine dependence: Secondary | ICD-10-CM | POA: Diagnosis not present

## 2018-10-13 DIAGNOSIS — H02403 Unspecified ptosis of bilateral eyelids: Secondary | ICD-10-CM | POA: Diagnosis not present

## 2018-10-13 DIAGNOSIS — Z79899 Other long term (current) drug therapy: Secondary | ICD-10-CM | POA: Diagnosis not present

## 2018-10-13 DIAGNOSIS — K219 Gastro-esophageal reflux disease without esophagitis: Secondary | ICD-10-CM | POA: Diagnosis not present

## 2018-10-13 NOTE — Addendum Note (Signed)
Addendum  created 10/13/18 0705 by Marrianne Mood, CRNA   Charge Capture section accepted

## 2018-10-13 NOTE — Discharge Summary (Signed)
Physician Discharge Summary  Patient ID: Robyn Bailey MRN: [id number] DOB/AGE: Feb 09, 1941 77 y.o.  Admit date: 10/12/2018 Discharge date: 10/13/2018  Admission Diagnoses: Ptosis eyelid upper bilateral  Discharge Diagnoses:  Active Problems:   Ptosis of eyelid, bilateral  Discharged Condition: stable  Hospital Course: Postoperatively patient did well tolerating diet and ambulating without assist. Pain controlled with oral medication.   Treatments: surgery: bilateral upper eyelid ptosis repair 1.27.20  Discharge Exam: Blood pressure 111/67, pulse 78, temperature (!) 97.1 F (36.2 C), resp. rate 18, height 5\' 5"  (1.651 m), weight 66.4 kg, SpO2 98 %. Incision/Wound: incisions intact  Disposition:   Discharge Instructions    Call MD for:  redness, tenderness, or signs of infection (pain, swelling, bleeding, redness, odor or green/yellow discharge around incision site)   Complete by:  As directed    Discharge instructions   Complete by:  As directed    [place] to shower 1.28.20. Soap and water ok, pat dry. No make up over sutures.  Sleep with head elevated on 2-3 pillows through follow up visit. Ice packs for comfort. OTC saline eye drops (eg Duratears, Refresh) as needed for comfort, dry eye symptoms. Place Tobradex ophthalmic (please send home with tube used in OR) IN each eye nightly. Do not need to apply this to incisions.  Patient has pain medication at home. Ok to use Motrin as directed for pain.   Driving Restrictions   Complete by:  As directed    No driving if taking narcotics   Lifting restrictions   Complete by:  As directed    No lifting > 5-10 lb through follow up visit   Resume previous diet   Complete by:  As directed      Allergies as of 10/13/2018   No Known Allergies     Medication List    TAKE these medications   ALIGN PO Take by mouth daily.   cetirizine 10 MG tablet Commonly known as:  ZYRTEC Take 1 tablet (10 mg total) by mouth daily.    pantoprazole 40 MG tablet Commonly known as:  PROTONIX Take 1 tablet (40 mg total) by mouth 2 (two) times daily.   triamcinolone 55 MCG/ACT Aero nasal inhaler Commonly known as:  NASACORT AQ Place 2 sprays into the nose daily.      Follow-up Information    Irene Limbo, MD In 1 week.   Specialty:  Plastic Surgery Why:  as scheduled Contact information: [address] [place] [address] [phone]           Signed: Irene Limbo 10/13/2018, 11:16 AM

## 2018-11-11 NOTE — Progress Notes (Addendum)
Subjective:   Robyn Bailey is a 78 y.o. female who presents for Medicare Annual (Subsequent) preventive examination.  Review of Systems:  No ROS.  Medicare Wellness Visit. Additional risk factors are reflected in the social history.  Cardiac Risk Factors include: dyslipidemia Sleep patterns: feels rested on waking, gets up 1 times nightly to void and sleeps 6-8 hours nightly.    Home Safety/Smoke Alarms: Feels safe in home. Smoke alarms in place.  Living environment; residence and Firearm Safety: 1-story house/ trailer. Lives alone, no needs for DME, good support system Seat Belt Safety/Bike Helmet: Wears seat belt.      Objective:     Vitals: BP 121/82   Pulse 64   Ht 5\' 5"  (1.651 m)   Wt 147 lb (66.7 kg)   SpO2 98%   BMI 24.46 kg/m   Body mass index is 24.46 kg/m.  Advanced Directives 11/12/2018 10/02/2018 05/28/2018 11/11/2017 11/08/2016 12/14/2014  Does Patient Have a Medical Advance Directive? Yes Yes Yes Yes No Yes  Type of Paramedic of [place];Living will [place];Living will [place];Living will [place];Living will - [place];Living will  Does patient want to make changes to medical advance directive? - No - Patient declined - - - No - Patient declined  Copy of [place] in Chart? No - copy requested - - No - copy requested - No - copy requested    Tobacco Social History   Tobacco Use  Smoking Status Former Smoker  Smokeless Tobacco Never Used     Counseling given: Not Answered  Past Medical History:  Diagnosis Date  . ABNORMAL ELECTROCARDIOGRAM 07/19/2007  . ALLERGIC RHINITIS 07/19/2007  . Anatomical narrow angle glaucoma 04/20/2009  . ANXIETY 07/19/2007  . BACK PAIN 11/08/2009  . Blepharospasm 06/06/2008  . COLONIC POLYPS, HX OF 07/19/2007  . Cough 09/22/2007  . DEPRESSION 07/19/2007  . DIVERTICULOSIS, COLON 07/19/2007  . FATIGUE 04/20/2009    . GERD (gastroesophageal reflux disease)   . HYPERLIPIDEMIA 07/19/2007  . Left sided sciatica 01/22/2011  . LOW BACK PAIN 07/19/2007  . OSTEOARTHRITIS 07/19/2007  . OSTEOPOROSIS 07/19/2007  . PERIPHERAL NEUROPATHY 06/08/2007  . Ptosis of eyelid, bilateral   . RHINITIS 02/27/2009  . SINUSITIS- ACUTE-NOS 11/04/2007  . SORE THROAT 02/27/2009   Past Surgical History:  Procedure Laterality Date  . BREAST BIOPSY    . COLONOSCOPY    . EYE SURGERY     cataract  . hammer toe surgury  2003   bilateral  . PTOSIS REPAIR Bilateral 10/12/2018   Procedure: Bilateral ptosis eyelid correction external approach suture technique;  Surgeon: Irene Limbo, MD;  Location: [facility];  Service: Plastics;  Laterality: Bilateral;  . TONSILLECTOMY     Family History  Problem Relation Age of Onset  . Colon cancer Mother   . Colon cancer Maternal Uncle    Social History   Socioeconomic History  . Marital status: Single    Spouse name: Not on file  . Number of children: Not on file  . Years of education: Not on file  . Highest education level: Not on file  Occupational History  . 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 Occupation: part time research legal  Social Needs  . Financial resource strain: Not hard at all  . Food insecurity:    Worry: Never true    Inability: Never true  . Transportation needs:  Medical: No    Non-medical: No  Tobacco Use  . Smoking status: Former Research scientist (life sciences)  . Smokeless tobacco: Never Used  Substance and Sexual Activity  . Alcohol use: Yes    Alcohol/week: 0.0 standard drinks    Comment: occasional glass of ; rare now b/c acid reflux  . Drug use: No  . Sexual activity: Not Currently    Birth control/protection: Post-menopausal  Lifestyle  . Physical activity:    Days per week: 4 days    Minutes per session: 50 min  . Stress: Only a little  Relationships  . Social connections:    Talks on phone: More than three times a  week    Gets together: Once a week    Attends religious service: 1 to 4 times per year    Active member of club or organization: No    Attends meetings of clubs or organizations: Never    Relationship status: Not on file  Other Topics Concern  . Not on file  Social History Narrative  . Not on file    Outpatient Encounter Medications as of 11/12/2018  Medication Sig  . cetirizine (ZYRTEC) 10 MG tablet Take 1 tablet (10 mg total) by mouth daily.  . pantoprazole (PROTONIX) 40 MG tablet Take 1 tablet (40 mg total) by mouth 2 (two) times daily.  . Probiotic Product (ALIGN PO) Take by mouth daily.  Marland Kitchen triamcinolone (NASACORT AQ) 55 MCG/ACT AERO nasal inhaler Place 2 sprays into the nose daily.  . [DISCONTINUED] pantoprazole (PROTONIX) 40 MG tablet Take 1 tablet (40 mg total) by mouth 2 (two) times daily.   No facility-administered encounter medications on file as of 11/12/2018.     Activities of Daily Living In your present state of health, do you have any difficulty performing the following activities: 11/12/2018  Hearing? N  Vision? N  Difficulty concentrating or making decisions? N  Walking or climbing stairs? N  Dressing or bathing? N  Doing errands, shopping? N  Preparing Food and eating ? N  Using the Toilet? N  In the past six months, have you accidently leaked urine? N  Do you have problems with loss of bowel control? N  Managing your Medications? N  Managing your Finances? N  Housekeeping or managing your Housekeeping? N  Some recent data might be hidden    Patient Care Team: Biagio Borg, MD as PCP - General Irene Limbo, MD as Consulting Physician (Plastic Surgery)    Assessment:   This is a routine wellness examination for [name]. Physical assessment deferred to PCP.  Exercise Activities and Dietary recommendations Current Exercise Habits: Structured exercise class;Home exercise routine, Type of exercise: walking;yoga, Time (Minutes): 40, Frequency  (Times/Week): 3, Weekly Exercise (Minutes/Week): 120, Intensity: Mild, Exercise limited by: None identified Diet (meal preparation, eat out, water intake, caffeinated beverages, dairy products, fruits and vegetables): in general, a "healthy" diet  , well balanced. eats a variety of fruits and vegetables daily, limits salt, fat/cholesterol, sugar,carbohydrates,caffeine, drinks 6-8 glasses of water daily.  Goals    . Patient Stated     Eat healthy and exercise to lower my cholesterol. I will increase the amount of water I drink daily. Enjoy life, friends and baking. Plan to plant a garden, continue to volunteer in the community.    . Patient Stated     Continue to take care of me by eating healthy, staying active physically and socially.        Fall Risk Fall Risk  11/12/2018  11/11/2017 11/11/2017 11/11/2017 11/08/2016  Falls in the past year? 0 No No No No   Depression Screen PHQ 2/9 Scores 11/12/2018 11/11/2017 11/11/2017 11/08/2016  PHQ - 2 Score 0 0 0 0  PHQ- 9 Score 0 0 0 -     Cognitive Function MMSE - Mini Mental State Exam 11/11/2017  Orientation to time 5  Orientation to Place 5  Registration 3  Attention/ Calculation 5  Recall 3  Language- name 2 objects 2  Language- repeat 1  Language- follow 3 step command 3  Language- read & follow direction 1  Write a sentence 1  Copy design 1  Total score 30       Ad8 score reviewed for issues:  Issues making decisions: no  Less interest in hobbies / activities: no  Repeats questions, stories (family complaining): no  Trouble using ordinary gadgets (microwave, computer, phone):no  Forgets the month or year: no  Mismanaging finances: no  Remembering appts: no  Daily problems with thinking and/or memory: no Ad8 score is= 0  Immunization History  Administered Date(s) Administered  . Pneumococcal Conjugate-13 10/20/2013  . Pneumococcal Polysaccharide-23 05/17/2006  . Td 04/20/2009   Screening Tests Health Maintenance    Topic Date Due  . INFLUENZA VACCINE  12/16/2018 (Originally 04/16/2018)  . TETANUS/TDAP  04/21/2019  . COLONOSCOPY  12/28/2019  . DEXA SCAN  Completed  . PNA vac Low Risk Adult  Completed      Plan:     Reviewed health maintenance screenings with patient today and relevant education, vaccines, and/or referrals were provided.   Continue doing brain stimulating activities (puzzles, reading, adult coloring books, staying active) to keep memory sharp.   Continue to eat heart healthy diet (full of fruits, vegetables, whole grains, lean protein, water--limit salt, fat, and sugar intake) and increase physical activity as tolerated.  I have personally reviewed and noted the following in the patient's chart:   . Medical and social history . Use of alcohol, tobacco or illicit drugs  . Current medications and supplements . Functional ability and status . Nutritional status . Physical activity . Advanced directives . List of other physicians . Vitals . Screenings to include cognitive, depression, and falls . Referrals and appointments  In addition, I have reviewed and discussed with patient certain preventive protocols, quality metrics, and best practice recommendations. A written personalized care plan for preventive services as well as general preventive health recommendations were provided to patient.     Michiel Cowboy, RN  11/12/2018   Medical screening examination/treatment/procedure(s) were performed by non-physician practitioner and as supervising physician I was immediately available for consultation/collaboration. I agree with above. Cathlean Cower, MD

## 2018-11-12 ENCOUNTER — Ambulatory Visit (INDEPENDENT_AMBULATORY_CARE_PROVIDER_SITE_OTHER): Payer: Medicare HMO | Admitting: *Deleted

## 2018-11-12 ENCOUNTER — Encounter: Payer: Self-pay | Admitting: Internal Medicine

## 2018-11-12 ENCOUNTER — Other Ambulatory Visit: Payer: Self-pay | Admitting: Internal Medicine

## 2018-11-12 ENCOUNTER — Other Ambulatory Visit (INDEPENDENT_AMBULATORY_CARE_PROVIDER_SITE_OTHER): Payer: Medicare HMO

## 2018-11-12 ENCOUNTER — Ambulatory Visit (INDEPENDENT_AMBULATORY_CARE_PROVIDER_SITE_OTHER): Payer: Medicare HMO | Admitting: Internal Medicine

## 2018-11-12 VITALS — BP 124/82 | HR 64 | Temp 97.7°F | Ht 65.0 in | Wt 147.0 lb

## 2018-11-12 VITALS — BP 121/82 | HR 64 | Ht 65.0 in | Wt 147.0 lb

## 2018-11-12 DIAGNOSIS — Z0000 Encounter for general adult medical examination without abnormal findings: Secondary | ICD-10-CM

## 2018-11-12 LAB — URINALYSIS, ROUTINE W REFLEX MICROSCOPIC
Bilirubin Urine: NEGATIVE
Ketones, ur: NEGATIVE
Leukocytes,Ua: NEGATIVE
Nitrite: NEGATIVE
Specific Gravity, Urine: >=1.03 — AB (ref 1.000–1.030)
Total Protein, Urine: NEGATIVE
Urine Glucose: NEGATIVE
Urobilinogen, UA: 0.2 (ref 0.0–1.0)
pH: 5 (ref 5.0–8.0)

## 2018-11-12 LAB — BASIC METABOLIC PANEL
BUN: 22 mg/dL (ref 6–23)
CO2: 29 mEq/L (ref 19–32)
Calcium: 11.4 mg/dL — ABNORMAL HIGH (ref 8.4–10.5)
Chloride: 103 mEq/L (ref 96–112)
Creatinine, Ser: 0.86 mg/dL (ref 0.40–1.20)
GFR: 63.85 mL/min (ref >60.00)
GLUCOSE: 84 mg/dL (ref 70–99)
Potassium: 4.5 mEq/L (ref 3.5–5.1)
Sodium: 138 mEq/L (ref 135–145)

## 2018-11-12 LAB — CBC WITH DIFFERENTIAL/PLATELET
Basophils Absolute: 0 10*3/uL (ref 0.0–0.1)
Basophils Relative: 0.5 % (ref 0.0–3.0)
Eosinophils Absolute: 0.1 10*3/uL (ref 0.0–0.7)
Eosinophils Relative: 2.4 % (ref 0.0–5.0)
HCT: 43.2 % (ref 36.0–46.0)
Hemoglobin: 14.9 g/dL (ref 12.0–15.0)
Lymphocytes Relative: 40.4 % (ref 12.0–46.0)
Lymphs Abs: 2.1 10*3/uL (ref 0.7–4.0)
MCHC: 34.5 g/dL (ref 30.0–36.0)
MCV: 82.6 fl (ref 78.0–100.0)
MONO ABS: 0.5 10*3/uL (ref 0.1–1.0)
Monocytes Relative: 9.5 % (ref 3.0–12.0)
Neutro Abs: 2.4 10*3/uL (ref 1.4–7.7)
Neutrophils Relative %: 47.2 % (ref 43.0–77.0)
Platelets: 192 10*3/uL (ref 150.0–400.0)
RBC: 5.23 Mil/uL — ABNORMAL HIGH (ref 3.87–5.11)
RDW: 13.6 % (ref 11.5–15.5)
WBC: 5.2 10*3/uL (ref 4.0–10.5)

## 2018-11-12 LAB — HEPATIC FUNCTION PANEL
ALT: 13 U/L (ref 0–35)
AST: 16 U/L (ref 0–37)
Albumin: 4.2 g/dL (ref 3.5–5.2)
Alkaline Phosphatase: 100 U/L (ref 39–117)
Bilirubin, Direct: 0.1 mg/dL (ref 0.0–0.3)
Total Bilirubin: 0.7 mg/dL (ref 0.2–1.2)
Total Protein: 7.1 g/dL (ref 6.0–8.3)

## 2018-11-12 LAB — LIPID PANEL
Cholesterol: 217 mg/dL — ABNORMAL HIGH (ref 0–200)
HDL: 78.1 mg/dL (ref >39.00)
LDL Cholesterol: 115 mg/dL — ABNORMAL HIGH (ref 0–99)
NONHDL: 138.67
Total CHOL/HDL Ratio: 3
Triglycerides: 118 mg/dL (ref 0.0–149.0)
VLDL: 23.6 mg/dL (ref 0.0–40.0)

## 2018-11-12 LAB — TSH: TSH: 1.4 u[IU]/mL (ref 0.35–4.50)

## 2018-11-12 MED ORDER — PANTOPRAZOLE SODIUM 40 MG PO TBEC: 40.0000 mg | DELAYED_RELEASE_TABLET | Freq: Two times a day (BID) | ORAL | 3 refills | Status: DC

## 2018-11-12 NOTE — Patient Instructions (Addendum)
Continue doing brain stimulating activities (puzzles, reading, adult coloring books, staying active) to keep memory sharp.   Continue to eat heart healthy diet (full of fruits, vegetables, whole grains, lean protein, water--limit salt, fat, and sugar intake) and increase physical activity as tolerated.   Robyn Bailey , Thank you for taking time to come for your Medicare Wellness Visit. I appreciate your ongoing commitment to your health goals. Please review the following plan we discussed and let me know if I can assist you in the future.   These are the goals we discussed: Goals    . Patient Stated     Eat healthy and exercise to lower my cholesterol. I will increase the amount of water I drink daily. Enjoy life, friends and baking. Plan to plant a garden, continue to volunteer in the community.    . Patient Stated     Continue to take care of me by eating healthy, staying active physically and socially.        This is a list of the screening recommended for you and due dates:  Health Maintenance  Topic Date Due  . Flu Shot  12/16/2018*  . Tetanus Vaccine  04/21/2019  . Colon Cancer Screening  12/28/2019  . DEXA scan (bone density measurement)  Completed  . Pneumonia vaccines  Completed  *Topic was postponed. The date shown is not the original due date.     Health Maintenance, Female Adopting a healthy lifestyle and getting preventive care can go a long way to promote health and wellness. Talk with your health care provider about what schedule of regular examinations is right for you. This is a good chance for you to check in with your provider about disease prevention and staying healthy. In between checkups, there are plenty of things you can do on your own. Experts have done a lot of research about which lifestyle changes and preventive measures are most likely to keep you healthy. Ask your health care provider for more information. Weight and diet Eat a healthy diet  Be sure  to include plenty of vegetables, fruits, low-fat dairy products, and lean protein.  Do not eat a lot of foods high in solid fats, added sugars, or salt.  Get regular exercise. This is one of the most important things you can do for your health. ? Most adults should exercise for at least 150 minutes each week. The exercise should increase your heart rate and make you sweat (moderate-intensity exercise). ? Most adults should also do strengthening exercises at least twice a week. This is in addition to the moderate-intensity exercise. Maintain a healthy weight  Body mass index (BMI) is a measurement that can be used to identify possible weight problems. It estimates body fat based on height and weight. Your health care provider can help determine your BMI and help you achieve or maintain a healthy weight.  For females 22 years of age and older: ? A BMI below 18.5 is considered underweight. ? A BMI of 18.5 to 24.9 is normal. ? A BMI of 25 to 29.9 is considered overweight. ? A BMI of 30 and above is considered obese. Watch levels of cholesterol and blood lipids  You should start having your blood tested for lipids and cholesterol at 78 years of age, then have this test every 5 years.  You may need to have your cholesterol levels checked more often if: ? Your lipid or cholesterol levels are high. ? You are older than 78 years of  age. ? You are at high risk for heart disease. Cancer screening Lung Cancer  Lung cancer screening is recommended for adults 39-87 years old who are at high risk for lung cancer because of a history of smoking.  A yearly low-dose CT scan of the lungs is recommended for people who: ? Currently smoke. ? Have quit within the past 15 years. ? Have at least a 30-pack-year history of smoking. A pack year is smoking an average of one pack of cigarettes a day for 1 year.  Yearly screening should continue until it has been 15 years since you quit.  Yearly screening  should stop if you develop a health problem that would prevent you from having lung cancer treatment. Breast Cancer  Practice breast self-awareness. This means understanding how your breasts normally appear and feel.  It also means doing regular breast self-exams. Let your health care provider know about any changes, no matter how small.  If you are in your 20s or 30s, you should have a clinical breast exam (CBE) by a health care provider every 1-3 years as part of a regular health exam.  If you are 41 or older, have a CBE every year. Also consider having a breast X-ray (mammogram) every year.  If you have a family history of breast cancer, talk to your health care provider about genetic screening.  If you are at high risk for breast cancer, talk to your health care provider about having an MRI and a mammogram every year.  Breast cancer gene (BRCA) assessment is recommended for women who have family members with BRCA-related cancers. BRCA-related cancers include: ? Breast. ? Ovarian. ? Tubal. ? Peritoneal cancers.  Results of the assessment will determine the need for genetic counseling and BRCA1 and BRCA2 testing. Cervical Cancer Your health care provider may recommend that you be screened regularly for cancer of the pelvic organs (ovaries, uterus, and vagina). This screening involves a pelvic examination, including checking for microscopic changes to the surface of your cervix (Pap test). You may be encouraged to have this screening done every 3 years, beginning at age 66.  For women ages 28-65, health care providers may recommend pelvic exams and Pap testing every 3 years, or they may recommend the Pap and pelvic exam, combined with testing for human papilloma virus (HPV), every 5 years. Some types of HPV increase your risk of cervical cancer. Testing for HPV may also be done on women of any age with unclear Pap test results.  Other health care providers may not recommend any screening  for nonpregnant women who are considered low risk for pelvic cancer and who do not have symptoms. Ask your health care provider if a screening pelvic exam is right for you.  If you have had past treatment for cervical cancer or a condition that could lead to cancer, you need Pap tests and screening for cancer for at least 20 years after your treatment. If Pap tests have been discontinued, your risk factors (such as having a new sexual partner) need to be reassessed to determine if screening should resume. Some women have medical problems that increase the chance of getting cervical cancer. In these cases, your health care provider may recommend more frequent screening and Pap tests. Colorectal Cancer  This type of cancer can be detected and often prevented.  Routine colorectal cancer screening usually begins at 78 years of age and continues through 78 years of age.  Your health care provider may recommend screening at an  earlier age if you have risk factors for colon cancer.  Your health care provider may also recommend using home test kits to check for hidden blood in the stool.  A small camera at the end of a tube can be used to examine your colon directly (sigmoidoscopy or colonoscopy). This is done to check for the earliest forms of colorectal cancer.  Routine screening usually begins at age 12.  Direct examination of the colon should be repeated every 5-10 years through 78 years of age. However, you may need to be screened more often if early forms of precancerous polyps or small growths are found. Skin Cancer  Check your skin from head to toe regularly.  Tell your health care provider about any new moles or changes in moles, especially if there is a change in a mole's shape or color.  Also tell your health care provider if you have a mole that is larger than the size of a pencil eraser.  Always use sunscreen. Apply sunscreen liberally and repeatedly throughout the day.  Protect  yourself by wearing long sleeves, pants, a wide-brimmed hat, and sunglasses whenever you are outside. Heart disease, diabetes, and high blood pressure  High blood pressure causes heart disease and increases the risk of stroke. High blood pressure is more likely to develop in: ? People who have blood pressure in the high end of the normal range (130-139/85-89 mm Hg). ? People who are overweight or obese. ? People who are African American.  If you are 74-75 years of age, have your blood pressure checked every 3-5 years. If you are 1 years of age or older, have your blood pressure checked every year. You should have your blood pressure measured twice-once when you are at a hospital or clinic, and once when you are not at a hospital or clinic. Record the average of the two measurements. To check your blood pressure when you are not at a hospital or clinic, you can use: ? An automated blood pressure machine at a pharmacy. ? A home blood pressure monitor.  If you are between 18 years and 44 years old, ask your health care provider if you should take aspirin to prevent strokes.  Have regular diabetes screenings. This involves taking a blood sample to check your fasting blood sugar level. ? If you are at a normal weight and have a low risk for diabetes, have this test once every three years after 78 years of age. ? If you are overweight and have a high risk for diabetes, consider being tested at a younger age or more often. Preventing infection Hepatitis B  If you have a higher risk for hepatitis B, you should be screened for this virus. You are considered at high risk for hepatitis B if: ? You were born in a country where hepatitis B is common. Ask your health care provider which countries are considered high risk. ? Your parents were born in a high-risk country, and you have not been immunized against hepatitis B (hepatitis B vaccine). ? You have HIV or AIDS. ? You use needles to inject street  drugs. ? You live with someone who has hepatitis B. ? You have had sex with someone who has hepatitis B. ? You get hemodialysis treatment. ? You take certain medicines for conditions, including cancer, organ transplantation, and autoimmune conditions. Hepatitis C  Blood testing is recommended for: ? Everyone born from [birth year] through 1965. ? Anyone with known risk factors for hepatitis C. Sexually transmitted  infections (STIs)  You should be screened for sexually transmitted infections (STIs) including gonorrhea and chlamydia if: ? You are sexually active and are younger than 79 years of age. ? You are older than 78 years of age and your health care provider tells you that you are at risk for this type of infection. ? Your sexual activity has changed since you were last screened and you are at an increased risk for chlamydia or gonorrhea. Ask your health care provider if you are at risk.  If you do not have HIV, but are at risk, it may be recommended that you take a prescription medicine daily to prevent HIV infection. This is called pre-exposure prophylaxis (PrEP). You are considered at risk if: ? You are sexually active and do not regularly use condoms or know the HIV status of your partner(s). ? You take drugs by injection. ? You are sexually active with a partner who has HIV. Talk with your health care provider about whether you are at high risk of being infected with HIV. If you choose to begin PrEP, you should first be tested for HIV. You should then be tested every 3 months for as long as you are taking PrEP. Pregnancy  If you are premenopausal and you may become pregnant, ask your health care provider about preconception counseling.  If you may become pregnant, take 400 to 800 micrograms (mcg) of folic acid every day.  If you want to prevent pregnancy, talk to your health care provider about birth control (contraception). Osteoporosis and menopause  Osteoporosis is a disease in  which the bones lose minerals and strength with aging. This can result in serious bone fractures. Your risk for osteoporosis can be identified using a bone density scan.  If you are 50 years of age or older, or if you are at risk for osteoporosis and fractures, ask your health care provider if you should be screened.  Ask your health care provider whether you should take a calcium or vitamin D supplement to lower your risk for osteoporosis.  Menopause may have certain physical symptoms and risks.  Hormone replacement therapy may reduce some of these symptoms and risks. Talk to your health care provider about whether hormone replacement therapy is right for you. Follow these instructions at home:  Schedule regular health, dental, and eye exams.  Stay current with your immunizations.  Do not use any tobacco products including cigarettes, chewing tobacco, or electronic cigarettes.  If you are pregnant, do not drink alcohol.  If you are breastfeeding, limit how much and how often you drink alcohol.  Limit alcohol intake to no more than 1 drink per day for nonpregnant women. One drink equals 12 ounces of beer, 5 ounces of , or 1 ounces of hard liquor.  Do not use street drugs.  Do not share needles.  Ask your health care provider for help if you need support or information about quitting drugs.  Tell your health care provider if you often feel depressed.  Tell your health care provider if you have ever been abused or do not feel safe at home. This information is not intended to replace advice given to you by your health care provider. Make sure you discuss any questions you have with your health care provider. Document Released: 03/18/2011 Document Revised: 02/08/2016 Document Reviewed: 06/06/2015 Elsevier Interactive Patient Education  2019 [employer].

## 2018-11-12 NOTE — Progress Notes (Signed)
Subjective:    Patient ID: Robyn Bailey, female    DOB: [birth year], 78 y.o.   MRN: [id number]  HPI  Here for wellness and f/u;  Overall doing ok;  Pt denies Chest pain, worsening SOB, DOE, wheezing, orthopnea, PND, worsening LE edema, palpitations, dizziness or syncope.  Pt denies neurological change such as new headache, facial or extremity weakness.  Pt denies polydipsia, polyuria, or low sugar symptoms. Pt states overall good compliance with treatment and medications, good tolerability, and has been trying to follow appropriate diet.  Pt denies worsening depressive symptoms, suicidal ideation or panic. No fever, night sweats, wt loss, loss of appetite, or other constitutional symptoms.  Pt states good ability with ADL's, has low fall risk, home safety reviewed and adequate, no other significant changes in hearing or vision, and only occasionally active with exercise. Doing better recently now that she has cut herself off from several "Toxic family relatonships", has 2 good friends locally, never married, no children  S.p bilat cataract surgury, did have left ptosis with eyelid lift procedure one mo ago, now doing well.  Did have a swelling discomfort to the salivary glands at the base of the tongue for several months, plans to f/u with dental next mo, and let us know if needs ENT referral for now Past Medical History:  Diagnosis Date  . ABNORMAL ELECTROCARDIOGRAM 07/19/2007  . ALLERGIC RHINITIS 07/19/2007  . Anatomical narrow angle glaucoma 04/20/2009  . ANXIETY 07/19/2007  . BACK PAIN 11/08/2009  . Blepharospasm 06/06/2008  . COLONIC POLYPS, HX OF 07/19/2007  . Cough 09/22/2007  . DEPRESSION 07/19/2007  . DIVERTICULOSIS, COLON 07/19/2007  . FATIGUE 04/20/2009  . GERD (gastroesophageal reflux disease)   . HYPERLIPIDEMIA 07/19/2007  . Left sided sciatica 01/22/2011  . LOW BACK PAIN 07/19/2007  . OSTEOARTHRITIS 07/19/2007  . OSTEOPOROSIS 07/19/2007  . PERIPHERAL NEUROPATHY 06/08/2007  . Ptosis of eyelid,  bilateral   . RHINITIS 02/27/2009  . SINUSITIS- ACUTE-NOS 11/04/2007  . SORE THROAT 02/27/2009   Past Surgical History:  Procedure Laterality Date  . BREAST BIOPSY    . COLONOSCOPY    . EYE SURGERY     cataract  . hammer toe surgury  2003   bilateral  . PTOSIS REPAIR Bilateral 10/12/2018   Procedure: Bilateral ptosis eyelid correction external approach suture technique;  Surgeon: Irene Limbo, MD;  Location: [facility];  Service: Plastics;  Laterality: Bilateral;  . TONSILLECTOMY      reports that she has quit smoking. She has never used smokeless tobacco. She reports current alcohol use. She reports that she does not use drugs. family history includes Colon cancer in her maternal uncle and mother. No Known Allergies Current Outpatient Medications on File Prior to Visit  Medication Sig Dispense Refill  . cetirizine (ZYRTEC) 10 MG tablet Take 1 tablet (10 mg total) by mouth daily. 30 tablet 11  . Probiotic Product (ALIGN PO) Take by mouth daily.    Marland Kitchen triamcinolone (NASACORT AQ) 55 MCG/ACT AERO nasal inhaler Place 2 sprays into the nose daily. 1 Inhaler 12   No current facility-administered medications on file prior to visit.   . Review of Systems Constitutional: Negative for other unusual diaphoresis, sweats, appetite or weight changes HENT: Negative for other worsening hearing loss, ear pain, facial swelling, mouth sores or neck stiffness.   Eyes: Negative for other worsening pain, redness or other visual disturbance.  Respiratory: Negative for other stridor or swelling Cardiovascular: Negative for other palpitations or other chest pain  Gastrointestinal: Negative for worsening diarrhea or loose stools, blood in stool, distention or other pain Genitourinary: Negative for hematuria, flank pain or other change in urine volume.  Musculoskeletal: Negative for myalgias or other joint swelling.  Skin: Negative for other color change, or other wound or worsening  drainage.  Neurological: Negative for other syncope or numbness. Hematological: Negative for other adenopathy or swelling Psychiatric/Behavioral: Negative for hallucinations, other worsening agitation, SI, self-injury, or new decreased concentration All other system neg per pt    Objective:   Physical Exam BP 124/82   Pulse 64   Temp 97.7 F (36.5 C) (Oral)   Ht 5\' 5"  (1.651 m)   Wt 147 lb (66.7 kg)   SpO2 98%   BMI 24.46 kg/m  VS noted,  Constitutional: Pt is oriented to person, place, and time. Appears well-developed and well-nourished, in no significant distress and comfortable Head: Normocephalic and atraumatic  Eyes: Conjunctivae and EOM are normal. Pupils are equal, round, and reactive to light Right Ear: External ear normal without discharge Left Ear: External ear normal without discharge Nose: Nose without discharge or deformity Tongue with small but swollen salivary gland opening reddish in color, minimal tender, no other rash, lesion or swelling, no neck LA Mouth/Throat: Oropharynx is without other ulcerations and moist  Neck: Normal range of motion. Neck supple. No JVD present. No tracheal deviation present or significant neck LA or mass Cardiovascular: Normal rate, regular rhythm, normal heart sounds and intact distal pulses.   Pulmonary/Chest: WOB normal and breath sounds without rales or wheezing  Abdominal: Soft. Bowel sounds are normal. NT. No HSM  Musculoskeletal: Normal range of motion. Exhibits no edema Lymphadenopathy: Has no other cervical adenopathy.  Neurological: Pt is alert and oriented to person, place, and time. Pt has normal reflexes. No cranial nerve deficit. Motor grossly intact, Gait intact Skin: Skin is warm and dry. No rash noted or new ulcerations Psychiatric:  Has normal mood and affect. Behavior is normal without agitation No other exam findings Lab Results  Component Value Date   WBC 5.2 10/10/2017   HGB 14.3 10/10/2017   HCT 42.7  10/10/2017   PLT 208.0 10/10/2017   GLUCOSE 79 10/10/2017   CHOL 212 (H) 10/10/2017   TRIG 108.0 10/10/2017   HDL 74.00 10/10/2017   LDLDIRECT 134.0 10/20/2013   LDLCALC 116 (H) 10/10/2017   ALT 12 10/10/2017   AST 16 10/10/2017   NA 141 10/10/2017   K 4.4 10/10/2017   CL 105 10/10/2017   CREATININE 0.80 10/10/2017   BUN 21 10/10/2017   CO2 28 10/10/2017   TSH 1.35 10/10/2017        Assessment & Plan:

## 2018-11-12 NOTE — Patient Instructions (Addendum)
Please continue all other medications as before, and refills have been done if requested.  Please have the pharmacy call with any other refills you may need.  Please call if you would want to be referred to ENT for the area under the tongue  Please continue your efforts at being more active, low cholesterol diet, and weight control.  You are otherwise up to date with prevention measures today.  Please keep your appointments with your specialists as you may have planned  Please go to the LAB in the Basement (turn left off the elevator) for the tests to be done today  You will be contacted by phone if any changes need to be made immediately.  Otherwise, you will receive a letter about your results with an explanation, but please check with MyChart first.  Please remember to sign up for MyChart if you have not done so, as this will be important to you in the future with finding out test results, communicating by private email, and scheduling acute appointments online when needed.

## 2018-11-12 NOTE — Assessment & Plan Note (Signed)

## 2018-11-13 ENCOUNTER — Telehealth: Payer: Self-pay

## 2018-11-13 NOTE — Telephone Encounter (Signed)
-----   Message from Biagio Borg, MD sent at 11/12/2018  7:52 PM EST ----- Left message on MyChart, pt to cont same tx except  The test results show that your current treatment is OK, except the calcium level on the last 2 blood tests have been getting higher.  The reason for this is not clear, but can sometimes be vitamin or hormone related.  Please return to the lab for PTh hormone level and Vitamin D, as well as go to the [facility] for a chest xray to make sure we are not missing a lung problem.Redmond Baseman to please inform pt, I will do xray and lab orders

## 2018-11-13 NOTE — Telephone Encounter (Signed)
Pt has viewed results via MyChart  

## 2018-11-16 ENCOUNTER — Other Ambulatory Visit: Payer: Self-pay | Admitting: Internal Medicine

## 2018-11-16 ENCOUNTER — Other Ambulatory Visit (INDEPENDENT_AMBULATORY_CARE_PROVIDER_SITE_OTHER): Payer: Medicare HMO

## 2018-11-16 ENCOUNTER — Telehealth: Payer: Self-pay

## 2018-11-16 ENCOUNTER — Ambulatory Visit (INDEPENDENT_AMBULATORY_CARE_PROVIDER_SITE_OTHER)
Admission: RE | Admit: 2018-11-16 | Discharge: 2018-11-16 | Disposition: A | Discharge status: Home / Self Care | Payer: Medicare HMO | Source: Ambulatory Visit | Attending: Internal Medicine | Admitting: Internal Medicine

## 2018-11-16 DIAGNOSIS — R0602 Shortness of breath: Secondary | ICD-10-CM | POA: Diagnosis not present

## 2018-11-16 DIAGNOSIS — R05 Cough: Secondary | ICD-10-CM | POA: Diagnosis not present

## 2018-11-16 LAB — VITAMIN D 25 HYDROXY (VIT D DEFICIENCY, FRACTURES): VITD: 24.26 ng/mL — ABNORMAL LOW (ref 30.00–100.00)

## 2018-11-16 MED ORDER — VITAMIN D (ERGOCALCIFEROL) 1.25 MG (50000 UNIT) PO CAPS: 50000.0000 [IU] | ORAL_CAPSULE | ORAL | 0 refills | Status: DC

## 2018-11-16 NOTE — Telephone Encounter (Signed)
Pt has viewed results via MyChart  

## 2018-11-16 NOTE — Telephone Encounter (Signed)
-----   Message from Biagio Borg, MD sent at 11/16/2018 12:39 PM EST ----- Left message on MyChart, pt to cont same tx except  The test results show that your current treatment is OK, except the Vitamin D is low.  Please take a high dose of Vit D 50,000 units weekly for 12 weeks (this is a common treatment), then change on your own to OTC Vit D3 at 2000 units per day.  The PTH level and repeat calcium is still pending.   to please inform pt, I will do rx

## 2018-11-16 NOTE — Telephone Encounter (Signed)
Done erx 

## 2018-11-17 ENCOUNTER — Other Ambulatory Visit: Payer: Self-pay | Admitting: Internal Medicine

## 2018-11-17 DIAGNOSIS — E349 Endocrine disorder, unspecified: Secondary | ICD-10-CM

## 2018-11-17 LAB — PTH, INTACT AND CALCIUM
Calcium: 11.2 mg/dL — ABNORMAL HIGH (ref 8.6–10.4)
PTH: 158 pg/mL — ABNORMAL HIGH (ref 14–64)

## 2018-11-18 ENCOUNTER — Telehealth: Payer: Self-pay

## 2018-11-18 NOTE — Telephone Encounter (Signed)
-----   Message from Biagio Borg, MD sent at 11/17/2018  8:04 PM EST ----- Left message on MyChart, pt to cont same tx except  The test results show that your current treatment is OK, except the PTH level is remarkably elevated, meaning that you may have a condition called Hyperparathyroidism.  We will need to refer you to Endocrinology for further consideration.  You should hear hopefully soon.   to please inform pt, I will do referral

## 2018-11-18 NOTE — Telephone Encounter (Signed)
Pt called and is questioning the PTH level being so elevated and wondering if it could possibly be an error. Pt asking if she should have repeat testing possibly prior to going to an endo. Please advise.  Call back # [phone]

## 2018-11-18 NOTE — Telephone Encounter (Signed)
Pt has viewed results via MyChart  

## 2018-11-18 NOTE — Telephone Encounter (Signed)
Nope, no error.  She should seen endo, and I have placed the referral, thanks

## 2018-11-18 NOTE — Telephone Encounter (Signed)
Pt has been informed and will be looking out for the phone call to schedule.

## 2018-11-20 DIAGNOSIS — H04123 Dry eye syndrome of bilateral lacrimal glands: Secondary | ICD-10-CM | POA: Diagnosis not present

## 2018-11-20 DIAGNOSIS — H02403 Unspecified ptosis of bilateral eyelids: Secondary | ICD-10-CM | POA: Diagnosis not present

## 2018-11-20 DIAGNOSIS — H5201 Hypermetropia, right eye: Secondary | ICD-10-CM | POA: Diagnosis not present

## 2018-11-20 DIAGNOSIS — Z961 Presence of intraocular lens: Secondary | ICD-10-CM | POA: Diagnosis not present

## 2018-12-17 ENCOUNTER — Other Ambulatory Visit: Payer: Self-pay

## 2018-12-18 ENCOUNTER — Encounter: Payer: Self-pay | Admitting: Internal Medicine

## 2018-12-18 ENCOUNTER — Other Ambulatory Visit: Payer: Self-pay

## 2018-12-18 ENCOUNTER — Ambulatory Visit: Payer: Medicare HMO | Admitting: Internal Medicine

## 2018-12-18 VITALS — BP 118/68 | HR 76 | Temp 97.4°F | Ht 65.0 in | Wt 148.6 lb

## 2018-12-18 DIAGNOSIS — E559 Vitamin D deficiency, unspecified: Secondary | ICD-10-CM

## 2018-12-18 DIAGNOSIS — M810 Age-related osteoporosis without current pathological fracture: Secondary | ICD-10-CM

## 2018-12-18 DIAGNOSIS — E213 Hyperparathyroidism, unspecified: Secondary | ICD-10-CM | POA: Diagnosis not present

## 2018-12-18 LAB — BASIC METABOLIC PANEL
BUN: 18 mg/dL (ref 6–23)
CO2: 27 mEq/L (ref 19–32)
Calcium: 11.5 mg/dL — ABNORMAL HIGH (ref 8.4–10.5)
Chloride: 105 mEq/L (ref 96–112)
Creatinine, Ser: 0.76 mg/dL (ref 0.40–1.20)
GFR: 73.62 mL/min (ref >60.00)
Glucose, Bld: 87 mg/dL (ref 70–99)
Potassium: 4.2 mEq/L (ref 3.5–5.1)
Sodium: 140 mEq/L (ref 135–145)

## 2018-12-18 LAB — ALBUMIN: Albumin: 4.3 g/dL (ref 3.5–5.2)

## 2018-12-18 LAB — VITAMIN D 25 HYDROXY (VIT D DEFICIENCY, FRACTURES): VITD: 41.42 ng/mL (ref 30.00–100.00)

## 2018-12-18 NOTE — Progress Notes (Signed)
Name: Robyn Bailey  MRN/ DOB: [id number], [birth year]    Age/ Sex: 78 y.o., female    PCP: Robyn Borg, MD   Reason for Endocrinology Evaluation: Hypercalcemia      Date of Initial Endocrinology Evaluation: 12/18/2018     HPI: Robyn Bailey is a 78 y.o. female with a past medical history of scoliosis and osteoporosis . The patient presented for initial endocrinology clinic visit on 12/18/2018 for consultative assistance with her hypercalcemia.   Robyn Bailey indicates that she was first diagnosed with hypercalcemia in 09/2017, at which time this was noted during routine work up. Since that time, she has not experienced symptoms of constipation until started of Vitamin D ,she denies  polyuria, generalized weakness, diffuse muscle pains, significant memory impairment. She denies use of over the counter calcium (including supplements, Tums, Rolaids, or other calcium containing antacids), lithium, HCTZ.  She is on  vitamin D supplements.   She denies history of kidney stones, kidney disease, liver disease, granulomatous disease. She has osteoporosis but no  prior fractures. Daily dietary calcium intake: 2-3 servings. She denies family history of parathyroid disease, thyroid disease. Sister with osteoporosis.      HISTORY:  Past Medical History:  Past Medical History:  Diagnosis Date  . ABNORMAL ELECTROCARDIOGRAM 07/19/2007  . ALLERGIC RHINITIS 07/19/2007  . Anatomical narrow angle glaucoma 04/20/2009  . ANXIETY 07/19/2007  . BACK PAIN 11/08/2009  . Blepharospasm 06/06/2008  . COLONIC POLYPS, HX OF 07/19/2007  . Cough 09/22/2007  . DEPRESSION 07/19/2007  . DIVERTICULOSIS, COLON 07/19/2007  . FATIGUE 04/20/2009  . GERD (gastroesophageal reflux disease)   . HYPERLIPIDEMIA 07/19/2007  . Left sided sciatica 01/22/2011  . LOW BACK PAIN 07/19/2007  . OSTEOARTHRITIS 07/19/2007  . OSTEOPOROSIS 07/19/2007  . PERIPHERAL NEUROPATHY 06/08/2007  . Ptosis of eyelid, bilateral   . RHINITIS 02/27/2009  .  SINUSITIS- ACUTE-NOS 11/04/2007  . SORE THROAT 02/27/2009   Past Surgical History:  Past Surgical History:  Procedure Laterality Date  . BREAST BIOPSY    . COLONOSCOPY    . EYE SURGERY     cataract  . hammer toe surgury  2003   bilateral  . PTOSIS REPAIR Bilateral 10/12/2018   Procedure: Bilateral ptosis eyelid correction external approach suture technique;  Surgeon: Robyn Limbo, MD;  Location: [facility];  Service: Plastics;  Laterality: Bilateral;  . TONSILLECTOMY        Social History:  reports that she has quit smoking. She has never used smokeless tobacco. She reports current alcohol use. She reports that she does not use drugs.  Family History: family history includes Colon cancer in her maternal uncle and mother.   HOME MEDICATIONS: Allergies as of 12/18/2018   No Known Allergies     Medication List       Accurate as of December 18, 2018 10:36 AM. Always use your most recent med list.        ALIGN PO Take by mouth daily.   cetirizine 10 MG tablet Commonly known as:  ZYRTEC Take 1 tablet (10 mg total) by mouth daily.   diphenhydrAMINE 50 MG tablet Commonly known as:  BENADRYL Take 25 mg by mouth at bedtime as needed for allergies.   MINOXIDIL (TOPICAL) 5 % Soln Apply topically 2 (two) times daily.   pantoprazole 40 MG tablet Commonly known as:  PROTONIX Take 1 tablet (40 mg total) by mouth 2 (two) times daily.   triamcinolone 55 MCG/ACT Aero nasal inhaler Commonly known  as:  Nasacort AQ Place 2 sprays into the nose daily.   Vitamin D (Ergocalciferol) 1.25 MG (50000 UT) Caps capsule Commonly known as:  DRISDOL Take 1 capsule (50,000 Units total) by mouth every 7 (seven) days.          REVIEW OF SYSTEMS: A comprehensive ROS was conducted with the patient and is negative except as per HPI and below:  ROS     OBJECTIVE:  VS: BP 118/68 (BP Location: Left Arm, Patient Position: Sitting, Cuff Size: Normal)   Pulse 76   Temp (!) 97.4  F (36.3 C)   Ht 5\' 5"  (1.651 m)   Wt 148 lb 9.6 oz (67.4 kg)   SpO2 97%   BMI 24.73 kg/m    Wt Readings from Last 3 Encounters:  12/18/18 148 lb 9.6 oz (67.4 kg)  11/12/18 147 lb (66.7 kg)  11/12/18 147 lb (66.7 kg)     EXAM: General: Pt appears well and is in NAD  Hydration: Well-hydrated with moist mucous membranes and good skin turgor  Eyes: External eye exam normal without stare, lid lag or exophthalmos.  EOM intact.  PERRL.  Ears, Nose, Throat: Hearing: Grossly intact bilaterally Dental: Good dentition  Throat: Clear without mass, erythema or exudate  Neck: General: Supple without adenopathy. Thyroid: Thyroid size normal.  No goiter or nodules appreciated. No thyroid bruit.  Lungs: Clear with good BS bilat with no rales, rhonchi, or wheezes  Heart: Auscultation: RRR.  Abdomen: Normoactive bowel sounds, soft, nontender, without masses or organomegaly palpable  Extremities: Gait and station: Normal gait  Digits and nails: No clubbing, cyanosis, petechiae, or nodes Head and neck: Normal alignment and mobility BL UE: Normal ROM and strength. BL LE: No pretibial edema normal ROM and strength.  Skin: Hair: Texture and amount normal with gender appropriate distribution Skin Inspection: No rashes, acanthosis nigricans/skin tags. No lipohypertrophy Skin Palpation: Skin temperature, texture, and thickness normal to palpation  Neuro: Cranial nerves: II - XII grossly intact  Cerebellar: Normal coordination and movement; no tremor Motor: Normal strength throughout DTRs: 2+ and symmetric in UE without delay in relaxation phase  Mental Status: Judgment, insight: Intact Orientation: Oriented to time, place, and person Memory: Intact for recent and remote events Mood and affect: No depression, anxiety, or agitation     DATA REVIEWED: Results for Robyn, Bailey (MRN [id number]) as of 12/21/2018 09:38  Ref. Range 12/18/2018 11:02  Sodium Latest Ref Range: 135 - 145 mEq/L 140   Potassium Latest Ref Range: 3.5 - 5.1 mEq/L 4.2  Chloride Latest Ref Range: 96 - 112 mEq/L 105  CO2 Latest Ref Range: 19 - 32 mEq/L 27  Glucose Latest Ref Range: 70 - 99 mg/dL 87  BUN Latest Ref Range: 6 - 23 mg/dL 18  Creatinine Latest Ref Range: 0.40 - 1.20 mg/dL 0.76  Calcium Latest Ref Range: 8.4 - 10.5 mg/dL 11.5 (H)  Albumin Latest Ref Range: 3.5 - 5.2 g/dL 4.3  GFR Latest Ref Range: >60.00 mL/min 73.62  VITD Latest Ref Range: 30.00 - 100.00 ng/mL 41.42     DXA 11/17/2017  Results:  Lumbar spine L1-L4 Femoral neck (FN)  T-score 1.2 RFN: -2.6 LFN: -2.4  Change in BMD from previous DXA test (%) n/a n/a     ASSESSMENT/PLAN/RECOMMENDATIONS:   1. Hypercalcemia Secondary to Hyperparathyroidism :  - We dicussed the physiology of parathyroid gland and its effects in hypercalcemia - Pt is asymptomatic  - Most likely this is Primary Hyperparathyroidism but will proceed with  24-hr urine collection first to make sure this is indeed pHPT rather then Familial hypocalciuric hypercalcemia ([place]) - We have discussed the difference between [facility] and pHPT and the need to determine surgical candidacy with pHPT.    - In the mean time she was encouraged to stay hydrated - Avoid calcium supplements and avoid a low calcium diet - Maintain 2-3 servings of calcium a day    2. Vitamin D insufficieny   - She is on ergocalciferol 50,000 iu daily, this is causing her to have constipation, she has a few more week of this and she was advised once this prescription is complete to switch to OTC Vitamin D 1000 iu daily  Repeat Vitamin D is normal    Medications Decreased Ergocalciferol 50,000 iu to every other week     3. Osteoporosis :  - Pt has not been on bisphosphonates in the past, she is unable to consume calcium supplements due to hypercalcemia, she is encouraged to consume 2-3 servings of calcium daily  - She continues to exercise - We have discussed that if she has primary  hyperparathyroidism this will exacerbate her osteoporosis , we will discuss long term treatment once hypercalcemia is fully worked up.      F/u in 2 months   Signed electronically by: Mack Guise, MD  [facility]  [facility] [address] [place], [place] [address] Phone: [phone] FAX: [phone]   CC: Robyn Borg, MD [place] [address] Phone: [phone] Fax: [phone]   Return to Endocrinology clinic as below: Future Appointments  Date Time Provider [place]  11/17/2019  8:40 AM Robyn Borg, MD [id number]  11/17/2019  9:00 AM [facility]

## 2018-12-18 NOTE — Patient Instructions (Signed)
-   Please stay hydrated  - AVOID over the counter calcium  - Maintain 2-3 servings of calcium a day      24-Hour Urine Collection   You will be collecting your urine for a 24-hour period of time.  Your timer starts with your first urine of the morning (For example - If you first pee at 6AM, your timer will start at 6AM)  Throw away your first urine of the morning  Collect your urine every time you pee for the next 24 hours STOP your urine collection 24 hours after you started the collection (For example - You would stop at 65M the day after you started)

## 2018-12-21 ENCOUNTER — Other Ambulatory Visit: Payer: Medicare HMO

## 2018-12-21 ENCOUNTER — Other Ambulatory Visit: Payer: Self-pay

## 2018-12-21 DIAGNOSIS — E210 Primary hyperparathyroidism: Secondary | ICD-10-CM | POA: Insufficient documentation

## 2018-12-21 DIAGNOSIS — E559 Vitamin D deficiency, unspecified: Secondary | ICD-10-CM | POA: Insufficient documentation

## 2018-12-21 DIAGNOSIS — E213 Hyperparathyroidism, unspecified: Secondary | ICD-10-CM | POA: Insufficient documentation

## 2018-12-21 DIAGNOSIS — M810 Age-related osteoporosis without current pathological fracture: Secondary | ICD-10-CM | POA: Insufficient documentation

## 2018-12-21 LAB — PTH, INTACT AND CALCIUM
Calcium: 11.3 mg/dL — ABNORMAL HIGH (ref 8.6–10.4)
PTH: 64 pg/mL (ref 14–64)

## 2018-12-22 LAB — EXTRA URINE SPECIMEN

## 2018-12-22 LAB — CREATININE, URINE, 24 HOUR: Creatinine, 24H Ur: 0.95 g/(24.h) (ref 0.50–2.15)

## 2018-12-22 LAB — CALCIUM, URINE, 24 HOUR: Calcium, 24H Urine: 381 mg/24 h — ABNORMAL HIGH

## 2018-12-23 ENCOUNTER — Other Ambulatory Visit: Payer: Self-pay

## 2018-12-28 ENCOUNTER — Telehealth: Payer: Self-pay | Admitting: Internal Medicine

## 2018-12-28 NOTE — Progress Notes (Signed)
Discussed lab results with Ms. Thall    Her Ca/Cr. Clearance ratio is 0.026 consistent with primary hyperparathyroidism  Pt does meet surgical criteria based on 24-hr urine calcium excretion of 381 mg/24 hr.   Pt in agreement with this . I explained to her that due to the COVID-19 restrictions this will take a few months prior to proceeding with this as all non-urgent surgeries are on hold at this time.      Pt expressed understanding of the above   A referral to Dr. Armandina Gemma was entered as well as a Parathyroid spect scan     Abby Nena Jordan, MD  [facility] End[facility]  [facility] [address] [place], [place] [address] Phone: [phone] FAX: [phone]

## 2018-12-28 NOTE — Telephone Encounter (Signed)
error 

## 2019-02-01 DIAGNOSIS — R69 Illness, unspecified: Secondary | ICD-10-CM | POA: Diagnosis not present

## 2019-02-03 DIAGNOSIS — L700 Acne vulgaris: Secondary | ICD-10-CM | POA: Diagnosis not present

## 2019-02-03 DIAGNOSIS — D2372 Other benign neoplasm of skin of left lower limb, including hip: Secondary | ICD-10-CM | POA: Diagnosis not present

## 2019-02-03 DIAGNOSIS — D2361 Other benign neoplasm of skin of right upper limb, including shoulder: Secondary | ICD-10-CM | POA: Diagnosis not present

## 2019-02-03 DIAGNOSIS — L821 Other seborrheic keratosis: Secondary | ICD-10-CM | POA: Diagnosis not present

## 2019-02-03 DIAGNOSIS — R238 Other skin changes: Secondary | ICD-10-CM | POA: Diagnosis not present

## 2019-02-03 DIAGNOSIS — D2272 Melanocytic nevi of left lower limb, including hip: Secondary | ICD-10-CM | POA: Diagnosis not present

## 2019-02-03 DIAGNOSIS — L859 Epidermal thickening, unspecified: Secondary | ICD-10-CM | POA: Diagnosis not present

## 2019-02-12 ENCOUNTER — Ambulatory Visit: Payer: Medicare HMO | Admitting: Internal Medicine

## 2019-02-17 DIAGNOSIS — H02403 Unspecified ptosis of bilateral eyelids: Secondary | ICD-10-CM | POA: Diagnosis not present

## 2019-03-02 DIAGNOSIS — E210 Primary hyperparathyroidism: Secondary | ICD-10-CM | POA: Diagnosis not present

## 2019-03-02 DIAGNOSIS — M810 Age-related osteoporosis without current pathological fracture: Secondary | ICD-10-CM | POA: Diagnosis not present

## 2019-03-03 ENCOUNTER — Other Ambulatory Visit (HOSPITAL_COMMUNITY): Payer: Self-pay | Admitting: Surgery

## 2019-03-03 ENCOUNTER — Other Ambulatory Visit: Payer: Self-pay | Admitting: Surgery

## 2019-03-03 DIAGNOSIS — E210 Primary hyperparathyroidism: Secondary | ICD-10-CM

## 2019-03-05 ENCOUNTER — Other Ambulatory Visit (HOSPITAL_COMMUNITY): Payer: Self-pay | Admitting: Surgery

## 2019-03-05 DIAGNOSIS — E210 Primary hyperparathyroidism: Secondary | ICD-10-CM

## 2019-03-29 ENCOUNTER — Encounter (HOSPITAL_COMMUNITY): Payer: Medicare HMO

## 2019-03-29 ENCOUNTER — Ambulatory Visit (HOSPITAL_COMMUNITY): Payer: Medicare HMO

## 2019-04-13 ENCOUNTER — Encounter (HOSPITAL_COMMUNITY): Payer: Medicare HMO

## 2019-04-13 ENCOUNTER — Ambulatory Visit (HOSPITAL_COMMUNITY): Payer: Medicare HMO

## 2019-04-13 ENCOUNTER — Encounter (HOSPITAL_COMMUNITY): Payer: Self-pay

## 2019-07-22 ENCOUNTER — Ambulatory Visit (INDEPENDENT_AMBULATORY_CARE_PROVIDER_SITE_OTHER): Payer: Medicare HMO

## 2019-07-22 ENCOUNTER — Other Ambulatory Visit: Payer: Self-pay

## 2019-07-22 DIAGNOSIS — Z23 Encounter for immunization: Secondary | ICD-10-CM | POA: Diagnosis not present

## 2019-08-03 DIAGNOSIS — R69 Illness, unspecified: Secondary | ICD-10-CM | POA: Diagnosis not present

## 2019-09-28 DIAGNOSIS — R69 Illness, unspecified: Secondary | ICD-10-CM | POA: Diagnosis not present

## 2019-10-06 ENCOUNTER — Ambulatory Visit: Payer: Medicare HMO

## 2019-11-08 ENCOUNTER — Ambulatory Visit: Payer: Medicare HMO | Attending: Internal Medicine

## 2019-11-08 DIAGNOSIS — Z23 Encounter for immunization: Secondary | ICD-10-CM

## 2019-11-08 NOTE — Progress Notes (Signed)
   Covid-19 Vaccination Clinic  Name:  Robyn Bailey    MRN: [id number] DOB: [birth year]  11/08/2019  [name] was observed post Covid-19 immunization for 15 minutes without incidence. She was provided with Vaccine Information Sheet and instruction to access the V-Safe system.   [name] was instructed to call 911 with any severe reactions post vaccine: Marland Kitchen Difficulty breathing  . Swelling of your face and throat  . A fast heartbeat  . A bad rash all over your body  . Dizziness and weakness    Immunizations Administered    Name Date Dose VIS Date Route   Pfizer COVID-19 Vaccine 11/08/2019 10:06 AM 0.3 mL 08/27/2019 Intramuscular   Manufacturer: [employer]anger[employer]   Lot: [id number]   [place]: [id number]

## 2019-11-15 ENCOUNTER — Telehealth: Payer: Self-pay

## 2019-11-15 NOTE — Telephone Encounter (Signed)
Ok to let pt know the labs are already ordered to be done at the [facility] to go there for lab tests, then to the new office location [place] for her visit

## 2019-11-15 NOTE — Telephone Encounter (Signed)
Patient calling and would like to get her labs done before her physical visit on 11/17/2019. Please advise.

## 2019-11-16 ENCOUNTER — Other Ambulatory Visit (INDEPENDENT_AMBULATORY_CARE_PROVIDER_SITE_OTHER): Payer: Medicare HMO

## 2019-11-16 DIAGNOSIS — Z0000 Encounter for general adult medical examination without abnormal findings: Secondary | ICD-10-CM | POA: Diagnosis not present

## 2019-11-16 LAB — URINALYSIS, ROUTINE W REFLEX MICROSCOPIC
Bilirubin Urine: NEGATIVE
Ketones, ur: NEGATIVE
Leukocytes,Ua: NEGATIVE
Nitrite: NEGATIVE
Specific Gravity, Urine: >=1.03 — AB (ref 1.000–1.030)
Total Protein, Urine: NEGATIVE
Urine Glucose: NEGATIVE
Urobilinogen, UA: 0.2 (ref 0.0–1.0)
pH: 5 (ref 5.0–8.0)

## 2019-11-16 LAB — CBC WITH DIFFERENTIAL/PLATELET
Basophils Absolute: 0 10*3/uL (ref 0.0–0.1)
Basophils Relative: 0.3 % (ref 0.0–3.0)
Eosinophils Absolute: 0.2 10*3/uL (ref 0.0–0.7)
Eosinophils Relative: 2.9 % (ref 0.0–5.0)
HCT: 42.3 % (ref 36.0–46.0)
Hemoglobin: 14.3 g/dL (ref 12.0–15.0)
Lymphocytes Relative: 33.7 % (ref 12.0–46.0)
Lymphs Abs: 1.9 10*3/uL (ref 0.7–4.0)
MCHC: 33.8 g/dL (ref 30.0–36.0)
MCV: 83.1 fl (ref 78.0–100.0)
Monocytes Absolute: 0.5 10*3/uL (ref 0.1–1.0)
Monocytes Relative: 8.5 % (ref 3.0–12.0)
Neutro Abs: 3.1 10*3/uL (ref 1.4–7.7)
Neutrophils Relative %: 54.6 % (ref 43.0–77.0)
Platelets: 198 10*3/uL (ref 150.0–400.0)
RBC: 5.09 Mil/uL (ref 3.87–5.11)
RDW: 14.1 % (ref 11.5–15.5)
WBC: 5.8 10*3/uL (ref 4.0–10.5)

## 2019-11-16 LAB — LIPID PANEL
Cholesterol: 209 mg/dL — ABNORMAL HIGH (ref 0–200)
HDL: 79.5 mg/dL (ref >39.00)
LDL Cholesterol: 110 mg/dL — ABNORMAL HIGH (ref 0–99)
NonHDL: 129.07
Total CHOL/HDL Ratio: 3
Triglycerides: 93 mg/dL (ref 0.0–149.0)
VLDL: 18.6 mg/dL (ref 0.0–40.0)

## 2019-11-16 LAB — BASIC METABOLIC PANEL
BUN: 25 mg/dL — ABNORMAL HIGH (ref 6–23)
CO2: 29 mEq/L (ref 19–32)
Calcium: 11.9 mg/dL — ABNORMAL HIGH (ref 8.4–10.5)
Chloride: 105 mEq/L (ref 96–112)
Creatinine, Ser: 0.78 mg/dL (ref 0.40–1.20)
GFR: 71.28 mL/min (ref >60.00)
Glucose, Bld: 79 mg/dL (ref 70–99)
Potassium: 4.3 mEq/L (ref 3.5–5.1)
Sodium: 140 mEq/L (ref 135–145)

## 2019-11-16 LAB — HEPATIC FUNCTION PANEL
ALT: 14 U/L (ref 0–35)
AST: 16 U/L (ref 0–37)
Albumin: 3.9 g/dL (ref 3.5–5.2)
Alkaline Phosphatase: 114 U/L (ref 39–117)
Bilirubin, Direct: 0.1 mg/dL (ref 0.0–0.3)
Total Bilirubin: 0.5 mg/dL (ref 0.2–1.2)
Total Protein: 7.1 g/dL (ref 6.0–8.3)

## 2019-11-16 LAB — TSH: TSH: 1.35 u[IU]/mL (ref 0.35–4.50)

## 2019-11-17 ENCOUNTER — Other Ambulatory Visit: Payer: Self-pay

## 2019-11-17 ENCOUNTER — Ambulatory Visit: Payer: Medicare HMO

## 2019-11-17 ENCOUNTER — Encounter: Payer: Self-pay | Admitting: Internal Medicine

## 2019-11-17 ENCOUNTER — Ambulatory Visit (INDEPENDENT_AMBULATORY_CARE_PROVIDER_SITE_OTHER): Payer: Medicare HMO | Admitting: Internal Medicine

## 2019-11-17 VITALS — BP 136/74 | HR 63 | Temp 98.0°F | Ht 65.0 in | Wt 151.6 lb

## 2019-11-17 DIAGNOSIS — E559 Vitamin D deficiency, unspecified: Secondary | ICD-10-CM

## 2019-11-17 DIAGNOSIS — Z0000 Encounter for general adult medical examination without abnormal findings: Secondary | ICD-10-CM | POA: Diagnosis not present

## 2019-11-17 DIAGNOSIS — F329 Major depressive disorder, single episode, unspecified: Secondary | ICD-10-CM

## 2019-11-17 DIAGNOSIS — M79604 Pain in right leg: Secondary | ICD-10-CM

## 2019-11-17 DIAGNOSIS — E785 Hyperlipidemia, unspecified: Secondary | ICD-10-CM | POA: Diagnosis not present

## 2019-11-17 DIAGNOSIS — Z0001 Encounter for general adult medical examination with abnormal findings: Secondary | ICD-10-CM

## 2019-11-17 DIAGNOSIS — R69 Illness, unspecified: Secondary | ICD-10-CM | POA: Diagnosis not present

## 2019-11-17 DIAGNOSIS — F32A Depression, unspecified: Secondary | ICD-10-CM

## 2019-11-17 DIAGNOSIS — E213 Hyperparathyroidism, unspecified: Secondary | ICD-10-CM

## 2019-11-17 MED ORDER — MELOXICAM 15 MG PO TABS: 15.0000 mg | ORAL_TABLET | Freq: Every day | ORAL | 3 refills | Status: DC

## 2019-11-17 NOTE — Assessment & Plan Note (Signed)
For lower chol diet, declines statin 

## 2019-11-17 NOTE — Assessment & Plan Note (Signed)
Cont oral replacement 

## 2019-11-17 NOTE — Patient Instructions (Addendum)
Please take all new medication as prescribed - the meloxicam for pain if needed  OK to stop at the first floor to make an appt with Sports medicine for the right leg pain  Please continue all other medications as before, and refills have been done if requested.  Please have the pharmacy call with any other refills you may need.  Please continue your efforts at being more active, low cholesterol diet, and weight control.  You are otherwise up to date with prevention measures today.  Please keep your appointments with your specialists as you may have planned  You will be contacted regarding the referral for: colonoscopy, and Dr Harlow Asa for the parathyroid issue  Please make an Appointment to return for your 1 year visit, or sooner if needed, with Lab testing by Appointment as well, to be done about 3-5 days before at the [facility] (so this is for TWO appointments - please see the scheduling desk as you leave)

## 2019-11-17 NOTE — Progress Notes (Signed)
Subjective:    Patient ID: Robyn Bailey, female    DOB: June 20, 1941, 79 y.o.   MRN: [id number]  HPI  Here for wellness and f/u;  Overall doing ok;  Pt denies Chest pain, worsening SOB, DOE, wheezing, orthopnea, PND, worsening LE edema, palpitations, dizziness or syncope.  Pt denies neurological change such as new headache, facial or extremity weakness.  Pt denies polydipsia, polyuria, or low sugar symptoms. Pt states overall good compliance with treatment and medications, good tolerability, and has been trying to follow appropriate diet.  Pt denies worsening depressive symptoms, suicidal ideation or panic. No fever, night sweats, wt loss, loss of appetite, or other constitutional symptoms.  Pt states good ability with ADL's, has low fall risk, home safety reviewed and adequate, no other significant changes in hearing or vision, and only occasionally active with exercise. BP Readings from Last 3 Encounters:  11/17/19 136/74  12/18/18 118/68  11/12/18 121/82   Wt Readings from Last 3 Encounters:  11/17/19 151 lb 9.6 oz (68.8 kg)  12/18/18 148 lb 9.6 oz (67.4 kg)  11/12/18 147 lb (66.7 kg)   Has been single all her life, then bumped into her HS boyfriend who now wants to marry, and she is not sure. Was lost in the scheduling with Dr Harlow Asa due to pandemic it seems, so need referral back for hyperparathyroid surgury. S/p covid vaccine x 1, due for second soon Also c/o having a heavy tote bag to the right shoulder, then onset of RLE pain x 6 wks, moderate, intermittent, but much worse in the AM when first getting up, starts mostly at a point joint posterior to there right greater trochanter with radiation to the knee, can hardly walk to first stand in the AM, but better later in the day. Not worse to lie on the right at night.  Past Medical History:  Diagnosis Date  . ABNORMAL ELECTROCARDIOGRAM 07/19/2007  . ALLERGIC RHINITIS 07/19/2007  . Anatomical narrow angle glaucoma 04/20/2009  . ANXIETY  07/19/2007  . BACK PAIN 11/08/2009  . Blepharospasm 06/06/2008  . COLONIC POLYPS, HX OF 07/19/2007  . Cough 09/22/2007  . DEPRESSION 07/19/2007  . DIVERTICULOSIS, COLON 07/19/2007  . FATIGUE 04/20/2009  . GERD (gastroesophageal reflux disease)   . HYPERLIPIDEMIA 07/19/2007  . Left sided sciatica 01/22/2011  . LOW BACK PAIN 07/19/2007  . OSTEOARTHRITIS 07/19/2007  . OSTEOPOROSIS 07/19/2007  . PERIPHERAL NEUROPATHY 06/08/2007  . Ptosis of eyelid, bilateral   . RHINITIS 02/27/2009  . SINUSITIS- ACUTE-NOS 11/04/2007  . SORE THROAT 02/27/2009   Past Surgical History:  Procedure Laterality Date  . BREAST BIOPSY    . COLONOSCOPY    . EYE SURGERY     cataract  . hammer toe surgury  2003   bilateral  . PTOSIS REPAIR Bilateral 10/12/2018   Procedure: Bilateral ptosis eyelid correction external approach suture technique;  Surgeon: Irene Limbo, MD;  Location: [facility];  Service: Plastics;  Laterality: Bilateral;  . TONSILLECTOMY      reports that she has quit smoking. She has never used smokeless tobacco. She reports current alcohol use. She reports that she does not use drugs. family history includes Colon cancer in her maternal uncle and mother. No Known Allergies Current Outpatient Medications on File Prior to Visit  Medication Sig Dispense Refill  . cetirizine (ZYRTEC) 10 MG tablet Take 1 tablet (10 mg total) by mouth daily. 30 tablet 11  . MINOXIDIL, TOPICAL, 5 % SOLN Apply topically 2 (two) times daily.    Marland Kitchen  pantoprazole (PROTONIX) 40 MG tablet Take 1 tablet (40 mg total) by mouth 2 (two) times daily. 180 tablet 3  . Probiotic Product (ALIGN PO) Take by mouth daily.    Marland Kitchen triamcinolone (NASACORT AQ) 55 MCG/ACT AERO nasal inhaler Place 2 sprays into the nose daily. 1 Inhaler 12  . Vitamin D, Ergocalciferol, (DRISDOL) 1.25 MG (50000 UT) CAPS capsule Take 1 capsule (50,000 Units total) by mouth every 7 (seven) days. (Patient not taking: Reported on 11/17/2019) 12 capsule 0   No  current facility-administered medications on file prior to visit.    Review of Systems All otherwise neg per pt     Objective:   Physical Exam BP 136/74   Pulse 63   Temp 98 F (36.7 C)   Ht 5\' 5"  (1.651 m)   Wt 151 lb 9.6 oz (68.8 kg)   SpO2 98%   BMI 25.23 kg/m  VS noted,  Constitutional: Pt appears in NAD HENT: Head: NCAT.  Right Ear: External ear normal.  Left Ear: External ear normal.  Eyes: . Pupils are equal, round, and reactive to light. Conjunctivae and EOM are normal Nose: without d/c or deformity Neck: Neck supple. Gross normal ROM Cardiovascular: Normal rate and regular rhythm.   Pulmonary/Chest: Effort normal and breath sounds without rales or wheezing.  Abd:  Soft, NT, ND, + BS, no organomegaly Right lateral leg with mild to mod tender from knee to greater trochanter Neurological: Pt is alert. At baseline orientation, motor grossly intact Skin: Skin is warm. No rashes, other new lesions, no LE edema Psychiatric: Pt behavior is normal without agitation  All otherwise neg per pt' Lab Results  Component Value Date   WBC 5.8 11/16/2019   HGB 14.3 11/16/2019   HCT 42.3 11/16/2019   PLT 198.0 11/16/2019   GLUCOSE 79 11/16/2019   CHOL 209 (H) 11/16/2019   TRIG 93.0 11/16/2019   HDL 79.50 11/16/2019   LDLDIRECT 134.0 10/20/2013   LDLCALC 110 (H) 11/16/2019   ALT 14 11/16/2019   AST 16 11/16/2019   NA 140 11/16/2019   K 4.3 11/16/2019   CL 105 11/16/2019   CREATININE 0.78 11/16/2019   BUN 25 (H) 11/16/2019   CO2 29 11/16/2019   TSH 1.35 11/16/2019      Assessment & Plan:

## 2019-11-18 ENCOUNTER — Encounter: Payer: Medicare HMO | Admitting: Internal Medicine

## 2019-11-19 ENCOUNTER — Ambulatory Visit: Payer: Medicare HMO | Admitting: Family Medicine

## 2019-11-19 ENCOUNTER — Encounter: Payer: Self-pay | Admitting: Family Medicine

## 2019-11-19 ENCOUNTER — Other Ambulatory Visit: Payer: Self-pay

## 2019-11-19 VITALS — BP 138/78 | HR 69 | Ht 65.0 in | Wt 153.2 lb

## 2019-11-19 DIAGNOSIS — M7061 Trochanteric bursitis, right hip: Secondary | ICD-10-CM

## 2019-11-19 NOTE — Patient Instructions (Addendum)
Thank you for coming in today. I think this pain is Hip Abductor tendonitis or trochanteric bursitis. Plan for home exercises.  If no benefit in a few weeks let me know and I will plan for PT.  If you are having problems let me know and we can change the plan sooner.  Recheck with me in 6 weeks. If not better.     Please perform the exercise program that we have prepared for you and gone over in detail on a daily basis.  In addition to the handout you were provided you can access your program through: [email or web address]   Your unique program code is:  [email or web address]   Hip Bursitis Rehab Ask your health care provider which exercises are safe for you. Do exercises exactly as told by your health care provider and adjust them as directed. It is normal to feel mild stretching, pulling, tightness, or discomfort as you do these exercises. Stop right away if you feel sudden pain or your pain gets worse. Do not begin these exercises until told by your health care provider. Stretching exercise This exercise warms up your muscles and joints and improves the movement and flexibility of your hip. This exercise also helps to relieve pain and stiffness. Iliotibial band stretch An iliotibial band is a strong band of muscle tissue that runs from the outer side of your hip to the outer side of your thigh and knee. 1. Lie on your side with your left / right leg in the top position. 2. Bend your left / right knee and grab your ankle. Stretch out your bottom arm to help you balance. 3. Slowly bring your knee back so your thigh is behind your body. 4. Slowly lower your knee toward the floor until you feel a gentle stretch on the outside of your left / right thigh. If you do not feel a stretch and your knee will not fall farther, place the heel of your other foot on top of your knee and pull your knee down toward the floor with your foot. 5. Hold this position for __________ seconds. 6. Slowly return to the  starting position. Repeat __________ times. Complete this exercise __________ times a day. Strengthening exercises These exercises build strength and endurance in your hip and pelvis. Endurance is the ability to use your muscles for a long time, even after they get tired. Bridge This exercise strengthens the muscles that move your thigh backward (hip extensors). 1. Lie on your back on a firm surface with your knees bent and your feet flat on the floor. 2. Tighten your buttocks muscles and lift your buttocks off the floor until your trunk is level with your thighs. ? Do not arch your back. ? You should feel the muscles working in your buttocks and the back of your thighs. If you do not feel these muscles, slide your feet 1-2 inches (2.5-5 cm) farther away from your buttocks. ? If this exercise is too easy, try doing it with your arms crossed over your chest. 3. Hold this position for __________ seconds. 4. Slowly lower your hips to the starting position. 5. Let your muscles relax completely after each repetition. Repeat __________ times. Complete this exercise __________ times a day. Squats This exercise strengthens the muscles in front of your thigh and knee (quadriceps). 1. Stand in front of a table, with your feet and knees pointing straight ahead. You may rest your hands on the table for balance but not for support. 2. Slowly bend your  knees and lower your hips like you are going to sit in a chair. ? Keep your weight over your heels, not over your toes. ? Keep your lower legs upright so they are parallel with the table legs. ? Do not let your hips go lower than your knees. ? Do not bend lower than told by your health care provider. ? If your hip pain increases, do not bend as low. 3. Hold the squat position for __________ seconds. 4. Slowly push with your legs to return to standing. Do not use your hands to pull yourself to standing. Repeat __________ times. Complete this exercise  __________ times a day. Hip hike 1. Stand sideways on a bottom step. Stand on your left / right leg with your other foot unsupported next to the step. You can hold on to the railing or wall for balance if needed. 2. Keep your knees straight and your torso square. Then lift your left / right hip up toward the ceiling. 3. Hold this position for __________ seconds. 4. Slowly let your left / right hip lower toward the floor, past the starting position. Your foot should get closer to the floor. Do not lean or bend your knees. Repeat __________ times. Complete this exercise __________ times a day. Single leg stand 1. Without shoes, stand near a railing or in a doorway. You may hold on to the railing or door frame as needed for balance. 2. Squeeze your left / right buttock muscles, then lift up your other foot. ? Do not let your left / right hip push out to the side. ? It is helpful to stand in front of a mirror for this exercise so you can watch your hip. 3. Hold this position for __________ seconds. Repeat __________ times. Complete this exercise __________ times a day. This information is not intended to replace advice given to you by your health care provider. Make sure you discuss any questions you have with your health care provider. Document Revised: 12/28/2018 Document Reviewed: 12/28/2018 Elsevier Patient Education  [place].

## 2019-11-19 NOTE — Progress Notes (Signed)
    Subjective:    CC: R hip and R lateral thigh pain  I, Molly Weber, LAT, ATC, am serving as scribe for Dr. Lynne Leader.  HPI: Pt is a 79 y/o female presenting w/ c/o R lateral hip and thigh pain x 6-8 weeks that courses from her R lateral hip to her lateral thigh and stops at the knee.  She states that she [place] the airport [place] tote bag on her shoulder and started having aching pain in her R lateral hip.  She describes her pain as a moderate, intermittent pain and notes that her pain is worse in the mornings when she first gets out of bed but dramatically improves throughout the day.  Radiating pain: Yes from R lateral hip to knee R hip mechanical symptoms: No R LE weakness: Yes w/ some giving-way R LE numbness/tingling: No Aggravating factors: Standing upright in the morning and trying to move first thing in the morning; hip IR/ER Treatments tried: Meloxicam; heat made it worse   Pertinent review of Systems: No fevers or chills  Relevant historical information: History of osteoporosis   Objective:    Vitals:   11/19/19 0920  BP: 138/78  Pulse: 69  SpO2: 97%   General: Well Developed, well nourished, and in no acute distress.   MSK: Right hip normal-appearing Normal motion. Some pain with figure-of-four stretch. Tender palpation greater trochanter. Hip abduction strength diminished 4/5.  External rotation strength slightly diminished 4/5.  Internal rotation and adduction strength intact. Leg lengths equal. Normal gait.     Impression and Recommendations:    Assessment and Plan: 79 y.o. female with right lateral hip pain due to trochanteric bursitis/hip abductor tendinopathy.  Plan for home exercise program as taught by ATC in clinic today.  If not improving with limited home exercise program will proceed with physical therapy referral.  Patient will notify me in a few weeks.  Ultimately if not better would consider injection versus  MRI.Marland Kitchen  97110; 15 additional minutes spent for Therapeutic exercises as stated in above notes.  This included exercises focusing on stretching, strengthening, with significant focus on eccentric aspects.   Long term goals include an improvement in range of motion, strength, endurance as well as avoiding reinjury. Patient's frequency would include in 1-2 times a day, 3-5 times a week for a duration of 6-12 weeks.  Proper technique shown and discussed handout in great detail with ATC.  All questions were discussed and answered.    Discussed warning signs or symptoms. Please see discharge instructions. Patient expresses understanding.   The above documentation has been reviewed and is accurate and complete Lynne Leader

## 2019-11-21 ENCOUNTER — Encounter: Payer: Self-pay | Admitting: Internal Medicine

## 2019-11-21 NOTE — Assessment & Plan Note (Signed)
stable overall by history and exam, recent data reviewed with pt, and pt to continue medical treatment as before,  to f/u any worsening symptoms or concerns  

## 2019-11-21 NOTE — Assessment & Plan Note (Addendum)
C/w myofascial pain likely msk, for mobic 15 qd prn, refer sports med  I spent 31 minutes in addition to time for wellness examination in preparing to see the patient by review of recent labs, imaging and procedures, obtaining and reviewing separately obtained history, communicating with the patient and family or caregiver, ordering medications, tests or procedures, and documenting clinical information in the EHR including the differential Dx, treatment, and any further evaluation and other management of right lateral leg pain, hyperparathyroidism, HLD, depression, vit d deficiency

## 2019-11-21 NOTE — Assessment & Plan Note (Signed)
For f/u general surgury f/u as is still active issue

## 2019-11-21 NOTE — Assessment & Plan Note (Signed)

## 2019-11-26 DIAGNOSIS — H524 Presbyopia: Secondary | ICD-10-CM | POA: Diagnosis not present

## 2019-11-26 DIAGNOSIS — H52202 Unspecified astigmatism, left eye: Secondary | ICD-10-CM | POA: Diagnosis not present

## 2019-11-26 DIAGNOSIS — Z961 Presence of intraocular lens: Secondary | ICD-10-CM | POA: Diagnosis not present

## 2019-12-01 ENCOUNTER — Ambulatory Visit: Payer: Medicare HMO | Attending: Internal Medicine

## 2019-12-01 DIAGNOSIS — Z23 Encounter for immunization: Secondary | ICD-10-CM

## 2019-12-01 NOTE — Progress Notes (Signed)
   Covid-19 Vaccination Clinic  Name:  Robyn Bailey    MRN: [id number] DOB: 04-01-1941  12/01/2019  Robyn Bailey was observed post Covid-19 immunization for 15 minutes without incident. She was provided with Vaccine Information Sheet and instruction to access the V-Safe system.   Robyn Bailey was instructed to call 911 with any severe reactions post vaccine: Marland Kitchen Difficulty breathing  . Swelling of face and throat  . A fast heartbeat  . A bad rash all over body  . Dizziness and weakness   Immunizations Administered    Name Date Dose VIS Date Route   Pfizer COVID-19 Vaccine 12/01/2019 10:31 AM 0.3 mL 08/27/2019 Intramuscular   Manufacturer: [place]   Lot: [id number]   [place]: [id number]

## 2019-12-10 ENCOUNTER — Telehealth: Payer: Self-pay | Admitting: Family Medicine

## 2019-12-10 NOTE — Telephone Encounter (Signed)
Next step for this is either injection or MRI. Recommend scheduling with myself or my partner Dr. Tamala Julian next week for injection.

## 2019-12-10 NOTE — Telephone Encounter (Signed)
Patient called stating that she has been experiencing a lot of pain. It has not gotten worse, but has also not gotten better. She wanted to know if there was anything else she could do to help the pain. She said that when she wakes up in the morning the pain is terrible and she can hardly walk but after a few hours it eases up.

## 2019-12-10 NOTE — Telephone Encounter (Signed)
Returned pt's call and she would like to proceed w/ injection as she will be going out of town the week of April 5th to take care of an ailing friend.  Please call pt to schedule appt at Dr. Thompson Caul discretion.

## 2019-12-13 ENCOUNTER — Other Ambulatory Visit: Payer: Self-pay

## 2019-12-13 ENCOUNTER — Ambulatory Visit (INDEPENDENT_AMBULATORY_CARE_PROVIDER_SITE_OTHER): Payer: Medicare HMO | Admitting: Family Medicine

## 2019-12-13 ENCOUNTER — Ambulatory Visit (INDEPENDENT_AMBULATORY_CARE_PROVIDER_SITE_OTHER): Payer: Medicare HMO

## 2019-12-13 ENCOUNTER — Encounter: Payer: Self-pay | Admitting: Family Medicine

## 2019-12-13 VITALS — BP 120/82 | HR 73 | Ht 65.0 in | Wt 153.0 lb

## 2019-12-13 DIAGNOSIS — M545 Low back pain, unspecified: Secondary | ICD-10-CM

## 2019-12-13 DIAGNOSIS — M25551 Pain in right hip: Secondary | ICD-10-CM

## 2019-12-13 DIAGNOSIS — G5701 Lesion of sciatic nerve, right lower limb: Secondary | ICD-10-CM

## 2019-12-13 DIAGNOSIS — G8929 Other chronic pain: Secondary | ICD-10-CM

## 2019-12-13 DIAGNOSIS — M1611 Unilateral primary osteoarthritis, right hip: Secondary | ICD-10-CM | POA: Diagnosis not present

## 2019-12-13 DIAGNOSIS — M47816 Spondylosis without myelopathy or radiculopathy, lumbar region: Secondary | ICD-10-CM | POA: Diagnosis not present

## 2019-12-13 MED ORDER — GABAPENTIN 100 MG PO CAPS: 200.0000 mg | ORAL_CAPSULE | Freq: Every day | ORAL | 0 refills | Status: DC

## 2019-12-13 NOTE — Assessment & Plan Note (Signed)
Patient given injection today and tolerated the procedure well.  On ultrasound it does appear the patient had an increase in diameter of the sciatic nerve.  Patient has had difficulty with hyperparathyroidism, patient did have an MRI in 2012 that independently visualized by me showing L4-L5 and L5-S1 arthritic changes with facet arthropathy that could also be contributing.  Patient could do formal physical therapy will be traveling out of the state which is a social determinants health and makes it difficult.  Meloxicam is okay to take on a daily basis as long as her stomach seems to tolerate it.  Gabapentin given for nighttime relief.  Follow-up again in 4 to 8 weeks

## 2019-12-13 NOTE — Progress Notes (Signed)
Robyn Bailey Phone: [phone] Subjective:    I'm seeing this patient by the request  of:  Biagio Borg, MD  CC: Right hip pain follow-up  [id number]   11/19/2019-Dr. Georgina Snell Assessment and Plan 79 y.o. female with right lateral hip pain due to trochanteric bursitis/hip abductor tendinopathy.  Plan for home exercise program as taught by ATC in clinic today.  If not improving with limited home exercise program will proceed with physical therapy referral.  Patient will notify me in a few weeks.  Ultimately if not better would consider injection versus MRI.Marland Kitchen  97110; 15 additional minutes spent for Therapeutic exercises as stated in above notes.  This included exercises focusing on stretching, strengthening, with significant focus on eccentric aspects.   Long term goals include an improvement in range of motion, strength, endurance as well as avoiding reinjury. Patient's frequency would include in 1-2 times a day, 3-5 times a week for a duration of 6-12 weeks.  Proper technique shown and discussed handout in great detail with ATC.  All questions were discussed and answered.  Update 12/13/2019 Robyn Bailey is a 78 y.o. female coming in with complaint of right hip pain since December. Patient is here for an injection today. Pain is worse at night when turning in bed. Has a hard time walking initially in the morning. After 3-4 hours she does not feel any pain. She is active in yard and does not have pain. Has been doing some stretches but they have not helped.      Past Medical History:  Diagnosis Date  . ABNORMAL ELECTROCARDIOGRAM 07/19/2007  . ALLERGIC RHINITIS 07/19/2007  . Anatomical narrow angle glaucoma 04/20/2009  . ANXIETY 07/19/2007  . BACK PAIN 11/08/2009  . Blepharospasm 06/06/2008  . COLONIC POLYPS, HX OF 07/19/2007  . Cough 09/22/2007  . DEPRESSION 07/19/2007  . DIVERTICULOSIS, COLON 07/19/2007  . FATIGUE 04/20/2009  .  GERD (gastroesophageal reflux disease)   . HYPERLIPIDEMIA 07/19/2007  . Left sided sciatica 01/22/2011  . LOW BACK PAIN 07/19/2007  . OSTEOARTHRITIS 07/19/2007  . OSTEOPOROSIS 07/19/2007  . PERIPHERAL NEUROPATHY 06/08/2007  . Ptosis of eyelid, bilateral   . RHINITIS 02/27/2009  . SINUSITIS- ACUTE-NOS 11/04/2007  . SORE THROAT 02/27/2009   Past Surgical History:  Procedure Laterality Date  . BREAST BIOPSY    . COLONOSCOPY    . EYE SURGERY     cataract  . hammer toe surgury  2003   bilateral  . PTOSIS REPAIR Bilateral 10/12/2018   Procedure: Bilateral ptosis eyelid correction external approach suture technique;  Surgeon: Irene Limbo, MD;  Location: [facility];  Service: Plastics;  Laterality: Bilateral;  . TONSILLECTOMY     Social History   Socioeconomic History  . Marital status: Single    Spouse name: Not on file  . Number of children: Not on file  . Years of education: Not on file  . Highest education level: Not on file  Occupational History  . 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 Occupation: part time research legal  Tobacco Use  . Smoking status: Former Research scientist (life sciences)  . Smokeless tobacco: Never Used  Substance and Sexual Activity  . Alcohol use: Yes    Alcohol/week: 0.0 standard drinks    Comment: occasional glass of wine; rare now b/c acid reflux  . Drug use: No  . Sexual activity: Not Currently    Birth control/protection: Post-menopausal  Other Topics Concern  .  Not on file  Social History Narrative  . Not on file   Social Determinants of Health   Financial Resource Strain:   . Difficulty of Paying Living Expenses:   Food Insecurity:   . Worried About Charity fundraiser in the Last Year:   . Arboriculturist in the Last Year:   Transportation Needs:   . Film/video editor (Medical):   Marland Kitchen Lack of Transportation (Non-Medical):   Physical Activity:   . Days of Exercise per Week:   . Minutes of Exercise per Session:     Stress:   . Feeling of Stress :   Social Connections:   . Frequency of Communication with Friends and Family:   . Frequency of Social Gatherings with Friends and Family:   . Attends Religious Services:   . Active Member of Clubs or Organizations:   . Attends Archivist Meetings:   Marland Kitchen Marital Status:    No Known Allergies Family History  Problem Relation Age of Onset  . Colon cancer Mother   . Colon cancer Maternal Uncle       Current Outpatient Medications (Respiratory):  .  cetirizine (ZYRTEC) 10 MG tablet, Take 1 tablet (10 mg total) by mouth daily. Marland Kitchen  triamcinolone (NASACORT AQ) 55 MCG/ACT AERO nasal inhaler, Place 2 sprays into the nose daily.  Current Outpatient Medications (Analgesics):  .  meloxicam (MOBIC) 15 MG tablet, Take 1 tablet (15 mg total) by mouth daily.   Current Outpatient Medications (Other):  Marland Kitchen  MINOXIDIL, TOPICAL, 5 % SOLN, Apply topically 2 (two) times daily. .  pantoprazole (PROTONIX) 40 MG tablet, Take 1 tablet (40 mg total) by mouth 2 (two) times daily. .  Probiotic Product (ALIGN PO), Take by mouth daily. .  Vitamin D, Ergocalciferol, (DRISDOL) 1.25 MG (50000 UT) CAPS capsule, Take 1 capsule (50,000 Units total) by mouth every 7 (seven) days.   Reviewed prior external information including notes and imaging from  primary care provider As well as notes that were available from care everywhere and other healthcare systems.  Past medical history, social, surgical and family history all reviewed in electronic medical record.  No pertanent information unless stated regarding to the chief complaint.   Review of Systems:  No headache, visual changes, nausea, vomiting, diarrhea, constipation, dizziness, abdominal pain, skin rash, fevers, chills, night sweats, weight loss, swollen lymph nodes,  joint swelling, chest pain, shortness of breath, mood changes. POSITIVE muscle aches, body aches  Objective  Blood pressure 120/82, pulse 73, height 5'  5" (1.651 m), weight 153 lb (69.4 kg), SpO2 98 %.   General: No apparent distress alert anxious and well oriented x2 HEENT: Pupils equal, extraocular movements intact  Respiratory: Patient's speak in full sentences and does not appear short of breath  Cardiovascular: No lower extremity edema, non tender, no erythema  Neuro: Cranial nerves II through XII are intact, neurovascularly intact in all extremities with 2+ DTRs and 2+ pulses.  Gait antalgic favoring right hip with external rotation of the leg MSK:   Right hip exam shows the patient does have tenderness to palpation over the greater trochanteric area but severe tenderness over the piriformis.  Positive Corky Sox.  Negative straight leg test.  Neurovascular intact distally.  4-5 strength but seems symmetric to the contralateral side  Procedure: Real-time Ultrasound Guided Injection of right piriformis tendon sheath Device: GE Logiq Q7 Ultrasound guided injection is preferred based studies that show increased duration, increased effect, greater accuracy, decreased procedural  pain, increased response rate, and decreased cost with ultrasound guided versus blind injection.  Verbal informed consent obtained.  Time-out conducted.  Noted no overlying erythema, induration, or other signs of local infection.  Skin prepped in a sterile fashion.  Local anesthesia: Topical Ethyl chloride.  With sterile technique and under real time ultrasound guidance: With a 21-gauge 3 inch needle injected into the right piriformis with a total of 1 cc of 0.5% Marcaine and 1 cc of Kenalog 40 mg/mL Completed without difficulty  Pain immediately resolved suggesting accurate placement of the medication.  Advised to call if fevers/chills, erythema, induration, drainage, or persistent bleeding.  Images permanently stored and available for review in the ultrasound unit.  Impression: Technically successful ultrasound guided injection.    Impression and Recommendations:      This case required medical decision making of moderate complexity. The above documentation has been reviewed and is accurate and complete Lyndal Pulley, DO       Note: This dictation was prepared with Dragon dictation along with smaller phrase technology. Any transcriptional errors that result from this process are unintentional.

## 2019-12-13 NOTE — Patient Instructions (Signed)
Xray today Injected hip today Piriformis exercises See me again in

## 2019-12-13 NOTE — Telephone Encounter (Signed)
Patient scheduled today with Dr. Tamala Julian.

## 2020-01-03 ENCOUNTER — Encounter: Payer: Self-pay | Admitting: Internal Medicine

## 2020-01-04 ENCOUNTER — Telehealth: Payer: Self-pay

## 2020-01-05 ENCOUNTER — Encounter: Payer: Self-pay | Admitting: Gastroenterology

## 2020-01-05 NOTE — Telephone Encounter (Signed)
Spoke with patient who is constipated. Per a verbal from Dr. Tamala Julian provided patient with miralax 17g and colace 100mg  recommendation. Also suggested Senakot. Patient voices understanding and will call if she continues to have issues.

## 2020-01-18 ENCOUNTER — Ambulatory Visit (AMBULATORY_SURGERY_CENTER): Payer: Self-pay | Admitting: *Deleted

## 2020-01-18 ENCOUNTER — Other Ambulatory Visit: Payer: Self-pay

## 2020-01-18 VITALS — Temp 96.8°F | Ht 65.0 in | Wt 148.6 lb

## 2020-01-18 DIAGNOSIS — Z800 Family history of malignant neoplasm of digestive organs: Secondary | ICD-10-CM

## 2020-01-18 MED ORDER — SUPREP BOWEL PREP KIT 17.5-3.13-1.6 GM/177ML PO SOLN: ORAL | 0 refills | Status: DC

## 2020-01-18 NOTE — Progress Notes (Signed)
2nd dose of covid vaccine 11-08-19  No trouble with anesthesia, no trouble moving neck or fam/hx of malignant hyperthermia  Pt is aware that care partner will wait in the car during procedure; if they feel like they will be too hot or cold to wait in the car; they may wait in the 4 th floor lobby. Patient is aware to bring only one care partner. We want them to wear a mask (we do not have any that we can provide them), practice social distancing, and we will check their temperatures when they get here.  I did remind the patient that their care partner needs to stay in the parking lot the entire time and have a cell phone available, we will call them when the pt is ready for discharge. Patient will wear mask into building.   No egg or soy allergy  No home oxygen use   No medications for weight loss taken  emmi information given  Pt denies constipation issues  Pt states she cannot take the Sutabs- Suprep given

## 2020-01-24 ENCOUNTER — Encounter: Payer: Self-pay | Admitting: Gastroenterology

## 2020-01-26 ENCOUNTER — Encounter: Payer: Self-pay | Admitting: Gastroenterology

## 2020-01-26 ENCOUNTER — Ambulatory Visit: Payer: Medicare HMO | Admitting: Family Medicine

## 2020-01-26 ENCOUNTER — Ambulatory Visit (AMBULATORY_SURGERY_CENTER): Payer: Medicare HMO | Admitting: Gastroenterology

## 2020-01-26 VITALS — BP 110/71 | HR 82 | Temp 97.0°F | Resp 15 | Ht 65.0 in | Wt 148.6 lb

## 2020-01-26 DIAGNOSIS — Z800 Family history of malignant neoplasm of digestive organs: Secondary | ICD-10-CM | POA: Diagnosis not present

## 2020-01-26 DIAGNOSIS — Z86010 Personal history of colonic polyps: Secondary | ICD-10-CM

## 2020-01-26 MED ORDER — SODIUM CHLORIDE 0.9 % IV SOLN: 500.0000 mL | INTRAVENOUS | Status: DC

## 2020-01-26 NOTE — Patient Instructions (Signed)
Handout given for high fiber diet.  YOU HAD AN ENDOSCOPIC PROCEDURE TODAY AT THE [facility]:   Refer to the procedure report that was given to you for any specific questions about what was found during the examination.  If the procedure report does not answer your questions, please call your gastroenterologist to clarify.  If you requested that your care partner not be given the details of your procedure findings, then the procedure report has been included in a sealed envelope for you to review at your convenience later.  YOU SHOULD EXPECT: Some feelings of bloating in the abdomen. Passage of more gas than usual.  Walking can help get rid of the air that was put into your GI tract during the procedure and reduce the bloating. If you had a lower endoscopy (such as a colonoscopy or flexible sigmoidoscopy) you may notice spotting of blood in your stool or on the toilet paper. If you underwent a bowel prep for your procedure, you may not have a normal bowel movement for a few days.  Please Note:  You might notice some irritation and congestion in your nose or some drainage.  This is from the oxygen used during your procedure.  There is no need for concern and it should clear up in a day or so.  SYMPTOMS TO REPORT IMMEDIATELY:   Following lower endoscopy (colonoscopy or flexible sigmoidoscopy):  Excessive amounts of blood in the stool  Significant tenderness or worsening of abdominal pains  Swelling of the abdomen that is new, acute  Fever of 100F or higher  For urgent or emergent issues, a gastroenterologist can be reached at any hour by calling [phone]. Do not use MyChart messaging for urgent concerns.    DIET:  We do recommend a small meal at first, but then you may proceed to your regular diet.  Drink plenty of fluids but you should avoid alcoholic beverages for 24 hours.  ACTIVITY:  You should plan to take it easy for the rest of today and you should NOT DRIVE or use  heavy machinery until tomorrow (because of the sedation medicines used during the test).    FOLLOW UP: Our staff will call the number listed on your records 48-72 hours following your procedure to check on you and address any questions or concerns that you may have regarding the information given to you following your procedure. If we do not reach you, we will leave a message.  We will attempt to reach you two times.  During this call, we will ask if you have developed any symptoms of COVID 19. If you develop any symptoms (ie: fever, flu-like symptoms, shortness of breath, cough etc.) before then, please call [phone].  If you test positive for Covid 19 in the 2 weeks post procedure, please call and report this information to us.    If any biopsies were taken you will be contacted by phone or by letter within the next 1-3 weeks.  Please call us at [phone] if you have not heard about the biopsies in 3 weeks.    SIGNATURES/CONFIDENTIALITY: You and/or your care partner have signed paperwork which will be entered into your electronic medical record.  These signatures attest to the fact that that the information above on your After Visit Summary has been reviewed and is understood.  Full responsibility of the confidentiality of this discharge information lies with you and/or your care-partner. 

## 2020-01-26 NOTE — Op Note (Addendum)
[facility] Patient Name: Robyn Bailey Procedure Date: 01/26/2020 8:02 AM MRN: [id number] Endoscopist: Mauri Pole , MD Age: 79 Referring MD:  Date of Birth: 03-Apr-1941 Gender: Female Account #: [id number] Procedure:                Colonoscopy Indications:              Screening in patient at increased risk: Family                            history of 1st-degree relative with colorectal                            cancer Medicines:                Monitored Anesthesia Care Procedure:                Pre-Anesthesia Assessment:                           - Prior to the procedure, a History and Physical                            was performed, and patient medications and                            allergies were reviewed. The patient's tolerance of                            previous anesthesia was also reviewed. The risks                            and benefits of the procedure and the sedation                            options and risks were discussed with the patient.                            All questions were answered, and informed consent                            was obtained. Prior Anticoagulants: The patient has                            taken no previous anticoagulant or antiplatelet                            agents. ASA Grade Assessment: II - A patient with                            mild systemic disease. After reviewing the risks                            and benefits, the patient was deemed in  satisfactory condition to undergo the procedure.                           After obtaining informed consent, the colonoscope                            was passed under direct vision. Throughout the                            procedure, the patient's blood pressure, pulse, and                            oxygen saturations were monitored continuously. The                            Colonoscope was introduced through the anus and                        advanced to the the cecum, identified by                            appendiceal orifice and ileocecal valve. The                            colonoscopy was performed without difficulty. The                            patient tolerated the procedure well. The quality                            of the bowel preparation was good. The ileocecal                            valve, appendiceal orifice, and rectum were                            photographed. Scope In: 8:12:32 AM Scope Out: 8:26:50 AM Scope Withdrawal Time: 0 hours 9 minutes 21 seconds  Total Procedure Duration: 0 hours 14 minutes 18 seconds  Findings:                 The perianal and digital rectal examinations were                            normal.                           Scattered small-mouthed diverticula were found in                            the sigmoid colon, descending colon, transverse                            colon and ascending colon.  Non-bleeding internal hemorrhoids were found during                            retroflexion. The hemorrhoids were small.                           The exam was otherwise without abnormality. Complications:            No immediate complications. Estimated Blood Loss:     Estimated blood loss was minimal. Impression:               - Diverticulosis in the sigmoid colon, in the                            descending colon, in the transverse colon and in                            the ascending colon.                           - Non-bleeding internal hemorrhoids.                           - The examination was otherwise normal.                           - No specimens collected. Recommendation:           - Patient has a contact number available for                            emergencies. The signs and symptoms of potential                            delayed complications were discussed with the                            patient. Return to  normal activities tomorrow.                            Written discharge instructions were provided to the                            patient.                           - Resume previous diet.                           - Continue present medications.                           - No repeat colonoscopy due to age. Mauri Pole, MD 01/26/2020 9:28:51 AM This report has been signed electronically.

## 2020-01-26 NOTE — Progress Notes (Signed)
VS per  Butch Penny

## 2020-01-26 NOTE — Progress Notes (Signed)
pt tolerated well. VSS. awake and to recovery. Report given to RN.  

## 2020-01-28 ENCOUNTER — Telehealth: Payer: Self-pay

## 2020-01-28 NOTE — Telephone Encounter (Signed)
  Follow up Call-  Call back number 01/26/2020  Post procedure Call Back phone  # [phone]  Permission to leave phone message Yes  Some recent data might be hidden     Patient questions:  Do you have a fever, pain , or abdominal swelling? No. Pain Score  0 *  Have you tolerated food without any problems? Yes.    Have you been able to return to your normal activities? Yes.    Do you have any questions about your discharge instructions: Diet   No. Medications  No. Follow up visit  No.  Do you have questions or concerns about your Care? No.  Actions: * If pain score is 4 or above: No action needed, pain <4.  1. Have you developed a fever since your procedure? no  2.   Have you had an respiratory symptoms (SOB or cough) since your procedure? no  3.   Have you tested positive for COVID 19 since your procedure no  4.   Have you had any family members/close contacts diagnosed with the COVID 19 since your procedure?  no   If yes to any of these questions please route to Joylene , RN and Erenest Rasher, RN

## 2020-02-25 ENCOUNTER — Telehealth: Payer: Self-pay

## 2020-02-25 NOTE — Telephone Encounter (Signed)
New message    The patient C/o swelling in ankles, foot, and legs had spoken to one of her friends who is a cardiologist advise to reach out to her PCP.    The patient is aware the call will be transferred over to the [facility] Triage nurse for assessment.   Call transfer / spoke with Shanon Brow

## 2020-02-29 ENCOUNTER — Ambulatory Visit (INDEPENDENT_AMBULATORY_CARE_PROVIDER_SITE_OTHER): Payer: Medicare HMO | Admitting: Internal Medicine

## 2020-02-29 ENCOUNTER — Encounter: Payer: Self-pay | Admitting: Internal Medicine

## 2020-02-29 ENCOUNTER — Other Ambulatory Visit: Payer: Self-pay

## 2020-02-29 VITALS — BP 126/80 | HR 65 | Temp 98.1°F | Ht 65.0 in | Wt 147.0 lb

## 2020-02-29 DIAGNOSIS — R600 Localized edema: Secondary | ICD-10-CM | POA: Insufficient documentation

## 2020-02-29 DIAGNOSIS — R69 Illness, unspecified: Secondary | ICD-10-CM | POA: Diagnosis not present

## 2020-02-29 DIAGNOSIS — F411 Generalized anxiety disorder: Secondary | ICD-10-CM

## 2020-02-29 DIAGNOSIS — E785 Hyperlipidemia, unspecified: Secondary | ICD-10-CM | POA: Diagnosis not present

## 2020-02-29 DIAGNOSIS — F329 Major depressive disorder, single episode, unspecified: Secondary | ICD-10-CM | POA: Diagnosis not present

## 2020-02-29 DIAGNOSIS — F32A Depression, unspecified: Secondary | ICD-10-CM

## 2020-02-29 MED ORDER — HYDROCHLOROTHIAZIDE 12.5 MG PO CAPS: 12.5000 mg | ORAL_CAPSULE | Freq: Every day | ORAL | 11 refills | Status: DC

## 2020-02-29 NOTE — Progress Notes (Signed)
Subjective:    Patient ID: Robyn Bailey, female    DOB: 10-Sep-1941, 79 y.o.   MRN: [id number]  HPI  Here to f/u; overall doing ok,  Pt denies chest pain, wheezing, orthopnea, PND, palpitations, dizziness or syncope.  Pt denies new neurological symptoms such as new headache, or facial or extremity weakness or numbness.  Pt denies polydipsia, polyuria, or low sugar episode.  Pt states overall good compliance with meds.  But does c/o worsening bilateral LE swelling x 2 mo gradually, associated with mild sob and doe that seems unusual for her.  Pt denies new neurological symptoms such as new headache, or facial or extremity weakness, though has hx of peripheral neuropathy   Pt denies polydipsia, polyuria.     Trying to follow low cholesterol diet.  Denies worsening depressive symptoms, suicidal ideation, or panic; has ongoing anxiety Past Medical History:  Diagnosis Date  . ABNORMAL ELECTROCARDIOGRAM 07/19/2007  . ALLERGIC RHINITIS 07/19/2007  . Anatomical narrow angle glaucoma 04/20/2009  . ANXIETY 07/19/2007  . BACK PAIN 11/08/2009  . Blepharospasm 06/06/2008  . COLONIC POLYPS, HX OF 07/19/2007  . Cough 09/22/2007  . DEPRESSION 07/19/2007  . DIVERTICULOSIS, COLON 07/19/2007  . FATIGUE 04/20/2009  . GERD (gastroesophageal reflux disease)   . HYPERLIPIDEMIA 07/19/2007  . Left sided sciatica 01/22/2011  . LOW BACK PAIN 07/19/2007  . OSTEOARTHRITIS 07/19/2007  . OSTEOPOROSIS 07/19/2007  . PERIPHERAL NEUROPATHY 06/08/2007  . Ptosis of eyelid, bilateral   . RHINITIS 02/27/2009  . SINUSITIS- ACUTE-NOS 11/04/2007  . SORE THROAT 02/27/2009   Past Surgical History:  Procedure Laterality Date  . BREAST BIOPSY    . COLONOSCOPY    . EYE SURGERY     cataract  . hammer toe surgury  2003   bilateral  . PTOSIS REPAIR Bilateral 10/12/2018   Procedure: Bilateral ptosis eyelid correction external approach suture technique;  Surgeon: Irene Limbo, MD;  Location: [facility];  Service: Plastics;   Laterality: Bilateral;  . TONSILLECTOMY      reports that she has quit smoking. She has never used smokeless tobacco. She reports current alcohol use. She reports that she does not use drugs. family history includes Colon cancer in her maternal uncle and mother. No Known Allergies Current Outpatient Medications on File Prior to Visit  Medication Sig Dispense Refill  . cetirizine (ZYRTEC) 10 MG tablet Take 1 tablet (10 mg total) by mouth daily. 30 tablet 11  . meloxicam (MOBIC) 15 MG tablet Take 1 tablet (15 mg total) by mouth daily. 90 tablet 3  . MINOXIDIL, TOPICAL, 5 % SOLN Apply topically 2 (two) times daily. Uses once daily    . Probiotic Product (ALIGN PO) Take by mouth daily. PRN    . triamcinolone (NASACORT AQ) 55 MCG/ACT AERO nasal inhaler Place 2 sprays into the nose daily. (Patient taking differently: Place 2 sprays into the nose daily. PRN) 1 Inhaler 12  . Vitamin D, Ergocalciferol, (DRISDOL) 1.25 MG (50000 UT) CAPS capsule Take 1 capsule (50,000 Units total) by mouth every 7 (seven) days. 12 capsule 0   No current facility-administered medications on file prior to visit.   Review of Systems All otherwise neg per pt    Objective:   Physical Exam BP 126/80 (BP Location: Left Arm, Patient Position: Sitting, Cuff Size: Large)   Pulse 65   Temp 98.1 F (36.7 C) (Oral)   Ht 5\' 5"  (1.651 m)   Wt 147 lb (66.7 kg)   SpO2 98%   BMI  24.46 kg/m  VS noted,  Constitutional: Pt appears in NAD HENT: Head: NCAT.  Right Ear: External ear normal.  Left Ear: External ear normal.  Eyes: . Pupils are equal, round, and reactive to light. Conjunctivae and EOM are normal Nose: without d/c or deformity Neck: Neck supple. Gross normal ROM Cardiovascular: Normal rate and regular rhythm.   Pulmonary/Chest: Effort normal and breath sounds without rales or wheezing.  Abd:  Soft, NT, ND, + BS, no organomegaly Neurological: Pt is alert. At baseline orientation, motor grossly intact Skin: Skin  is warm. No rashes, other new lesions, trace to 1+ bilat LE edema to below the knees Psychiatric: Pt behavior is normal without agitation  All otherwise neg per pt  ECG I have personally interpreted today - NSR 71     Assessment & Plan:

## 2020-02-29 NOTE — Patient Instructions (Addendum)
Your EKG was OK today  Please take all new medication as prescribed - the mild fluid pill  Please continue all other medications as before, and refills have been done if requested.  Please have the pharmacy call with any other refills you may need.  Please continue your efforts at being more active, low cholesterol diet, and weight control.  Please keep your appointments with your specialists as you may have planned  You will be contacted regarding the referral for: echocardiogram  Please go to the LAB at the blood drawing area for the tests to be done  You will be contacted by phone if any changes need to be made immediately.  Otherwise, you will receive a letter about your results with an explanation, but please check with MyChart first.  Please remember to sign up for MyChart if you have not done so, as this will be important to you in the future with finding out test results, communicating by private email, and scheduling acute appointments online when needed.  Please make an Appointment to return in 1 month

## 2020-03-01 ENCOUNTER — Telehealth: Payer: Self-pay | Admitting: Internal Medicine

## 2020-03-01 ENCOUNTER — Other Ambulatory Visit: Payer: Self-pay | Admitting: Internal Medicine

## 2020-03-01 ENCOUNTER — Other Ambulatory Visit (INDEPENDENT_AMBULATORY_CARE_PROVIDER_SITE_OTHER): Payer: Medicare HMO

## 2020-03-01 DIAGNOSIS — R600 Localized edema: Secondary | ICD-10-CM

## 2020-03-01 DIAGNOSIS — R0600 Dyspnea, unspecified: Secondary | ICD-10-CM

## 2020-03-01 DIAGNOSIS — R609 Edema, unspecified: Secondary | ICD-10-CM

## 2020-03-01 LAB — CBC WITH DIFFERENTIAL/PLATELET
Basophils Absolute: 0 10*3/uL (ref 0.0–0.1)
Basophils Relative: 0.4 % (ref 0.0–3.0)
Eosinophils Absolute: 0.1 10*3/uL (ref 0.0–0.7)
Eosinophils Relative: 1.8 % (ref 0.0–5.0)
HCT: 40.3 % (ref 36.0–46.0)
Hemoglobin: 13.7 g/dL (ref 12.0–15.0)
Lymphocytes Relative: 36.4 % (ref 12.0–46.0)
Lymphs Abs: 1.6 10*3/uL (ref 0.7–4.0)
MCHC: 34 g/dL (ref 30.0–36.0)
MCV: 84 fl (ref 78.0–100.0)
Monocytes Absolute: 0.5 10*3/uL (ref 0.1–1.0)
Monocytes Relative: 11.5 % (ref 3.0–12.0)
Neutro Abs: 2.2 10*3/uL (ref 1.4–7.7)
Neutrophils Relative %: 49.9 % (ref 43.0–77.0)
Platelets: 196 10*3/uL (ref 150.0–400.0)
RBC: 4.8 Mil/uL (ref 3.87–5.11)
RDW: 13.5 % (ref 11.5–15.5)
WBC: 4.4 10*3/uL (ref 4.0–10.5)

## 2020-03-01 LAB — BASIC METABOLIC PANEL
BUN: 19 mg/dL (ref 6–23)
CO2: 30 mEq/L (ref 19–32)
Calcium: 11.4 mg/dL — ABNORMAL HIGH (ref 8.4–10.5)
Chloride: 107 mEq/L (ref 96–112)
Creatinine, Ser: 0.76 mg/dL (ref 0.40–1.20)
GFR: 73.39 mL/min (ref >60.00)
Glucose, Bld: 80 mg/dL (ref 70–99)
Potassium: 4 mEq/L (ref 3.5–5.1)
Sodium: 139 mEq/L (ref 135–145)

## 2020-03-01 LAB — URINALYSIS, ROUTINE W REFLEX MICROSCOPIC
Bilirubin Urine: NEGATIVE
Ketones, ur: NEGATIVE
Leukocytes,Ua: NEGATIVE
Nitrite: NEGATIVE
Specific Gravity, Urine: >=1.03 — AB (ref 1.000–1.030)
Total Protein, Urine: NEGATIVE
Urine Glucose: NEGATIVE
Urobilinogen, UA: 0.2 (ref 0.0–1.0)
pH: 5 (ref 5.0–8.0)

## 2020-03-01 LAB — HEPATIC FUNCTION PANEL
ALT: 14 U/L (ref 0–35)
AST: 15 U/L (ref 0–37)
Albumin: 4.1 g/dL (ref 3.5–5.2)
Alkaline Phosphatase: 105 U/L (ref 39–117)
Bilirubin, Direct: 0.1 mg/dL (ref 0.0–0.3)
Total Bilirubin: 0.8 mg/dL (ref 0.2–1.2)
Total Protein: 6.7 g/dL (ref 6.0–8.3)

## 2020-03-01 MED ORDER — PANTOPRAZOLE SODIUM 40 MG PO TBEC: 40.0000 mg | DELAYED_RELEASE_TABLET | Freq: Two times a day (BID) | ORAL | 3 refills | Status: DC

## 2020-03-01 NOTE — Addendum Note (Signed)
Addended by: Biagio Borg on: 03/01/2020 05:13 PM   Modules accepted: Orders

## 2020-03-01 NOTE — Telephone Encounter (Signed)
    1.Medication Requested:pantoprazole (PROTONIX) 40 MG tablet  2. Pharmacy (Name, Street, City):[facility] [id number] [place], [place]  3. On Med List: yes  4. Last Visit with PCP: 02/29/20  5. Next visit date with PCP:   Agent: Please be advised that RX refills may take up to 3 business days. We ask that you follow-up with your pharmacy.

## 2020-03-01 NOTE — Telephone Encounter (Signed)
° ° °  Patient requesting order for echo

## 2020-03-01 NOTE — Telephone Encounter (Signed)
Ok this is done 

## 2020-03-01 NOTE — Telephone Encounter (Signed)
Would an OV be needed first?

## 2020-03-05 ENCOUNTER — Encounter: Payer: Self-pay | Admitting: Internal Medicine

## 2020-03-05 NOTE — Assessment & Plan Note (Addendum)
Etiology unclear, ecg reviewed, to start low dose hct, for echo r/o chf, leg elevation, low salt diet, compression stockings  I spent 31 minutes in preparing to see the patient by review of recent labs, imaging and procedures, obtaining and reviewing separately obtained history, communicating with the patient and family or caregiver, ordering medications, tests or procedures, and documenting clinical information in the EHR including the differential Dx, treatment, and any further evaluation and other management of edema, hld, anxiety, depression

## 2020-03-05 NOTE — Assessment & Plan Note (Signed)
stable overall by history and exam, recent data reviewed with pt, and pt to continue medical treatment as before,  to f/u any worsening symptoms or concerns  

## 2020-03-05 NOTE — Assessment & Plan Note (Signed)
Lab Results  Component Value Date   LDLCALC 110 (H) 11/16/2019  for lower chol diet, f/u lab next visit

## 2020-03-05 NOTE — Assessment & Plan Note (Signed)
Reassured, cont same tx,  to f/u any worsening symptoms or concerns

## 2020-03-06 ENCOUNTER — Other Ambulatory Visit: Payer: Self-pay

## 2020-03-06 ENCOUNTER — Ambulatory Visit (HOSPITAL_COMMUNITY): Payer: Medicare HMO | Attending: Cardiology

## 2020-03-06 DIAGNOSIS — R609 Edema, unspecified: Secondary | ICD-10-CM | POA: Diagnosis not present

## 2020-03-06 DIAGNOSIS — R0600 Dyspnea, unspecified: Secondary | ICD-10-CM

## 2020-03-07 ENCOUNTER — Telehealth: Payer: Self-pay | Admitting: Internal Medicine

## 2020-03-07 NOTE — Telephone Encounter (Signed)
    Please call to discuss echo results

## 2020-03-08 NOTE — Telephone Encounter (Signed)
F/u    The patient is calling back asking for the CMA to call her to discuss echo results today.

## 2020-03-10 NOTE — Telephone Encounter (Signed)
Robyn Bailey called pt and went over the results.

## 2020-03-17 ENCOUNTER — Other Ambulatory Visit (HOSPITAL_COMMUNITY): Payer: Medicare HMO

## 2020-05-23 DIAGNOSIS — L821 Other seborrheic keratosis: Secondary | ICD-10-CM | POA: Diagnosis not present

## 2020-05-23 DIAGNOSIS — L299 Pruritus, unspecified: Secondary | ICD-10-CM | POA: Diagnosis not present

## 2020-05-23 DIAGNOSIS — L219 Seborrheic dermatitis, unspecified: Secondary | ICD-10-CM | POA: Diagnosis not present

## 2020-06-16 ENCOUNTER — Ambulatory Visit (INDEPENDENT_AMBULATORY_CARE_PROVIDER_SITE_OTHER): Payer: Medicare HMO

## 2020-06-16 ENCOUNTER — Other Ambulatory Visit: Payer: Self-pay

## 2020-06-16 DIAGNOSIS — Z23 Encounter for immunization: Secondary | ICD-10-CM

## 2020-08-22 DIAGNOSIS — L219 Seborrheic dermatitis, unspecified: Secondary | ICD-10-CM | POA: Diagnosis not present

## 2020-08-22 DIAGNOSIS — L821 Other seborrheic keratosis: Secondary | ICD-10-CM | POA: Diagnosis not present

## 2020-08-29 ENCOUNTER — Ambulatory Visit (INDEPENDENT_AMBULATORY_CARE_PROVIDER_SITE_OTHER): Payer: Medicare HMO | Admitting: Internal Medicine

## 2020-08-29 ENCOUNTER — Encounter: Payer: Self-pay | Admitting: Internal Medicine

## 2020-08-29 ENCOUNTER — Other Ambulatory Visit: Payer: Self-pay

## 2020-08-29 DIAGNOSIS — R69 Illness, unspecified: Secondary | ICD-10-CM | POA: Diagnosis not present

## 2020-08-29 DIAGNOSIS — E213 Hyperparathyroidism, unspecified: Secondary | ICD-10-CM

## 2020-08-29 DIAGNOSIS — M5431 Sciatica, right side: Secondary | ICD-10-CM | POA: Diagnosis not present

## 2020-08-29 DIAGNOSIS — R252 Cramp and spasm: Secondary | ICD-10-CM

## 2020-08-29 DIAGNOSIS — F411 Generalized anxiety disorder: Secondary | ICD-10-CM | POA: Diagnosis not present

## 2020-08-29 DIAGNOSIS — R600 Localized edema: Secondary | ICD-10-CM | POA: Diagnosis not present

## 2020-08-29 LAB — HEPATIC FUNCTION PANEL
ALT: 17 U/L (ref 0–35)
AST: 17 U/L (ref 0–37)
Albumin: 4 g/dL (ref 3.5–5.2)
Alkaline Phosphatase: 96 U/L (ref 39–117)
Bilirubin, Direct: 0.1 mg/dL (ref 0.0–0.3)
Total Bilirubin: 0.6 mg/dL (ref 0.2–1.2)
Total Protein: 6.9 g/dL (ref 6.0–8.3)

## 2020-08-29 LAB — CBC WITH DIFFERENTIAL/PLATELET
Basophils Absolute: 0 10*3/uL (ref 0.0–0.1)
Basophils Relative: 0.4 % (ref 0.0–3.0)
Eosinophils Absolute: 0.1 10*3/uL (ref 0.0–0.7)
Eosinophils Relative: 2.3 % (ref 0.0–5.0)
HCT: 41 % (ref 36.0–46.0)
Hemoglobin: 14 g/dL (ref 12.0–15.0)
Lymphocytes Relative: 35.5 % (ref 12.0–46.0)
Lymphs Abs: 1.8 10*3/uL (ref 0.7–4.0)
MCHC: 34.2 g/dL (ref 30.0–36.0)
MCV: 83.8 fl (ref 78.0–100.0)
Monocytes Absolute: 0.5 10*3/uL (ref 0.1–1.0)
Monocytes Relative: 9.9 % (ref 3.0–12.0)
Neutro Abs: 2.6 10*3/uL (ref 1.4–7.7)
Neutrophils Relative %: 51.9 % (ref 43.0–77.0)
Platelets: 177 10*3/uL (ref 150.0–400.0)
RBC: 4.9 Mil/uL (ref 3.87–5.11)
RDW: 14 % (ref 11.5–15.5)
WBC: 5 10*3/uL (ref 4.0–10.5)

## 2020-08-29 LAB — BASIC METABOLIC PANEL
BUN: 24 mg/dL — ABNORMAL HIGH (ref 6–23)
CO2: 30 mEq/L (ref 19–32)
Calcium: 11.6 mg/dL — ABNORMAL HIGH (ref 8.4–10.5)
Chloride: 104 mEq/L (ref 96–112)
Creatinine, Ser: 0.78 mg/dL (ref 0.40–1.20)
GFR: 72.15 mL/min (ref >60.00)
Glucose, Bld: 83 mg/dL (ref 70–99)
Potassium: 4.5 mEq/L (ref 3.5–5.1)
Sodium: 138 mEq/L (ref 135–145)

## 2020-08-29 LAB — MAGNESIUM: Magnesium: 2.3 mg/dL (ref 1.5–2.5)

## 2020-08-29 LAB — TSH: TSH: 1.45 u[IU]/mL (ref 0.35–4.50)

## 2020-08-29 LAB — VITAMIN D 25 HYDROXY (VIT D DEFICIENCY, FRACTURES): VITD: 40.71 ng/mL (ref 30.00–100.00)

## 2020-08-29 MED ORDER — TIZANIDINE HCL 2 MG PO CAPS: 2.0000 mg | ORAL_CAPSULE | Freq: Three times a day (TID) | ORAL | 2 refills | Status: DC

## 2020-08-29 NOTE — Patient Instructions (Signed)
Please take all new medication as prescribed - the muscle relaxer as needed  Please continue all other medications as before, and refills have been done if requested.  Please have the pharmacy call with any other refills you may need.  Please continue your efforts at being more active, low cholesterol diet, and weight control.  Please keep your appointments with your specialists as you may have planned  Please go to the LAB at the blood drawing area for the tests to be done  You will be contacted by phone if any changes need to be made immediately.  Otherwise, you will receive a letter about your results with an explanation, but please check with MyChart first.  Please remember to sign up for MyChart if you have not done so, as this will be important to you in the future with finding out test results, communicating by private email, and scheduling acute appointments online when needed.

## 2020-08-29 NOTE — Assessment & Plan Note (Signed)
None today on low dose hct,  to f/u any worsening symptoms or concerns

## 2020-08-29 NOTE — Assessment & Plan Note (Signed)
For f/u lab , consider refer to Gen surgury

## 2020-08-29 NOTE — Progress Notes (Deleted)
   Subjective:    Patient ID: Robyn Bailey, female    DOB: 09/21/1940, 78 y.o.   MRN: [id number]  HPI    Review of Systems     Objective:   Physical Exam        Assessment & Plan:

## 2020-08-29 NOTE — Assessment & Plan Note (Addendum)
More likely related to hyperparathyroidism than hct use,  to f/u any worsening symptoms or concerns Lab Results  Component Value Date   CALCIUM 11.6 (H) 08/29/2020   CALCIUM 11.5 (H) 08/29/2020

## 2020-08-30 LAB — PTH, INTACT AND CALCIUM
Calcium: 11.5 mg/dL — ABNORMAL HIGH (ref 8.6–10.4)
PTH: 124 pg/mL — ABNORMAL HIGH (ref 14–64)

## 2020-09-01 ENCOUNTER — Other Ambulatory Visit: Payer: Self-pay | Admitting: Internal Medicine

## 2020-09-01 ENCOUNTER — Encounter: Payer: Self-pay | Admitting: Internal Medicine

## 2020-09-01 ENCOUNTER — Telehealth: Payer: Self-pay | Admitting: Internal Medicine

## 2020-09-01 DIAGNOSIS — E213 Hyperparathyroidism, unspecified: Secondary | ICD-10-CM

## 2020-09-01 NOTE — Telephone Encounter (Signed)
Awaiting Dr. Jenny Reichmann to see pts labs in order to discuss with her what he has stated.

## 2020-09-01 NOTE — Telephone Encounter (Signed)
Please call to discuss lab results 

## 2020-09-11 ENCOUNTER — Encounter: Payer: Self-pay | Admitting: Internal Medicine

## 2020-09-11 DIAGNOSIS — M5431 Sciatica, right side: Secondary | ICD-10-CM | POA: Insufficient documentation

## 2020-09-11 NOTE — Assessment & Plan Note (Signed)
?   Calcium related, for tizanidine prn,  to f/u any worsening symptoms or concerns

## 2020-09-11 NOTE — Progress Notes (Signed)
Established Patient Office Visit  Subjective:  Patient ID: Robyn Bailey, female    DOB: 27-Nov-1940  Age: 79 y.o. MRN: [id number]      Chief Complaint: (concise statement describing the symptom, problem, condition, diagnosis, physician recommended return, or other factor as reason for encounter) follow up HLD, hyperparathyroidism, hypercalcemia, edema, muscle cramps, anxiety, right sciatica       HPI:  Robyn Bailey is a 79 y.o. female here to f/u with c/o > 9 months worsening right upper anterior thigh cramping worse in the AM, and despite attempts at tx with exercise, heat, magnesium and nsaid prn, has been forced to be somewhat less active recently.  Has seen sport medicine with right sciatica recently which is a different kind of pain from this, but could not tolerate gabapentin due to constipation it seemed to start with this.  Both seem better with stretching in the AM when the worse pain occurs.  Also has hx of hypercalcemia and hyperparathyroidism > 2 yrs, was seen once per general surgury but elective surgury deferred due to covid and she did not f/u after that.    Pt denies chest pain, increasing sob or doe, wheezing, orthopnea, PND, increased LE swelling, palpitations, dizziness or syncope.  Pt denies new neurological symptoms such as new headache, or facial or extremity weakness or numbness.  Pt denies polydipsia, polyuria, or symptomatic low sugars  Denies worsening depressive symptoms, suicidal ideation, or panic Wt Readings from Last 3 Encounters:  08/29/20 148 lb (67.1 kg)  02/29/20 147 lb (66.7 kg)  01/26/20 148 lb 9.6 oz (67.4 kg)   BP Readings from Last 3 Encounters:  08/29/20 120/72  02/29/20 126/80  01/26/20 110/71        Past Medical History:  Diagnosis Date   ABNORMAL ELECTROCARDIOGRAM 07/19/2007   ALLERGIC RHINITIS 07/19/2007   Anatomical narrow angle glaucoma 04/20/2009   ANXIETY 07/19/2007   BACK PAIN 11/08/2009   Blepharospasm 06/06/2008   COLONIC POLYPS, HX  OF 07/19/2007   Cough 09/22/2007   DEPRESSION 07/19/2007   DIVERTICULOSIS, COLON 07/19/2007   FATIGUE 04/20/2009   GERD (gastroesophageal reflux disease)    HYPERLIPIDEMIA 07/19/2007   Left sided sciatica 01/22/2011   LOW BACK PAIN 07/19/2007   OSTEOARTHRITIS 07/19/2007   OSTEOPOROSIS 07/19/2007   PERIPHERAL NEUROPATHY 06/08/2007   Ptosis of eyelid, bilateral    RHINITIS 02/27/2009   SINUSITIS- ACUTE-NOS 11/04/2007   SORE THROAT 02/27/2009   Past Surgical History:  Procedure Laterality Date   BREAST BIOPSY     COLONOSCOPY     EYE SURGERY     cataract   hammer toe surgury  2003   bilateral   PTOSIS REPAIR Bilateral 10/12/2018   Procedure: Bilateral ptosis eyelid correction external approach suture technique;  Surgeon: Glenna Fellows, MD;  Location: [facility];  Service: Plastics;  Laterality: Bilateral;   TONSILLECTOMY      reports that she has quit smoking. She has never used smokeless tobacco. She reports current alcohol use. She reports that she does not use drugs. family history includes Colon cancer in her maternal uncle and mother. No Known Allergies Current Outpatient Medications on File Prior to Visit  Medication Sig Dispense Refill   cetirizine (ZYRTEC) 10 MG tablet Take 1 tablet (10 mg total) by mouth daily. 30 tablet 11   hydrochlorothiazide (MICROZIDE) 12.5 MG capsule Take 1 capsule (12.5 mg total) by mouth daily. 30 capsule 11   ketoconazole (NIZORAL) 2 % shampoo Apply topically.     meloxicam (  MOBIC) 15 MG tablet Take 1 tablet (15 mg total) by mouth daily. 90 tablet 3   MINOXIDIL, TOPICAL, 5 % SOLN Apply topically 2 (two) times daily. Uses once daily     pantoprazole (PROTONIX) 40 MG tablet Take 1 tablet (40 mg total) by mouth 2 (two) times daily. 180 tablet 3   Probiotic Product (ALIGN PO) Take by mouth daily. PRN     triamcinolone (NASACORT AQ) 55 MCG/ACT AERO nasal inhaler Place 2 sprays into the nose daily. (Patient taking  differently: Place 2 sprays into the nose daily. PRN) 1 Inhaler 12   No current facility-administered medications on file prior to visit.        ROS:  All others reviewed and negative.  Objective        PE:  BP 120/72 (BP Location: Left Arm, Patient Position: Sitting, Cuff Size: Large)    Pulse 64    Temp 97.6 F (36.4 C) (Oral)    Ht 5\' 5"  (1.651 m)    Wt 148 lb (67.1 kg)    SpO2 98%    BMI 24.63 kg/m                 Constitutional: Pt appears in NAD               HENT: Head: NCAT.                Right Ear: External ear normal.                 Left Ear: External ear normal.                Eyes: . Pupils are equal, round, and reactive to light. Conjunctivae and EOM are normal               Nose: without d/c or deformity               Neck: Neck supple. Gross normal ROM               Cardiovascular: Normal rate and regular rhythm.                 Pulmonary/Chest: Effort normal and breath sounds without rales or wheezing.                Abd:  Soft, NT, ND, + BS, no organomegaly               Neurological: Pt is alert. At baseline orientation, motor grossly intact               Skin: Skin is warm. No rashes, no other new lesions, LE edema none               Psychiatric: Pt behavior is normal without agitation   Assessment/Plan:  Robyn Bailey is a 79 y.o. White or Caucasian [1] female with  has a past medical history of ABNORMAL ELECTROCARDIOGRAM (07/19/2007), ALLERGIC RHINITIS (07/19/2007), Anatomical narrow angle glaucoma (04/20/2009), ANXIETY (07/19/2007), BACK PAIN (11/08/2009), Blepharospasm (06/06/2008), COLONIC POLYPS, HX OF (07/19/2007), Cough (09/22/2007), DEPRESSION (07/19/2007), DIVERTICULOSIS, COLON (07/19/2007), FATIGUE (04/20/2009), GERD (gastroesophageal reflux disease), HYPERLIPIDEMIA (07/19/2007), Left sided sciatica (01/22/2011), LOW BACK PAIN (07/19/2007), OSTEOARTHRITIS (07/19/2007), OSTEOPOROSIS (07/19/2007), PERIPHERAL NEUROPATHY (06/08/2007), Ptosis of eyelid, bilateral, RHINITIS  (02/27/2009), SINUSITIS- ACUTE-NOS (11/04/2007), and SORE THROAT (02/27/2009).   Assessment Plan  See problem oriented assessment and plan Labs reviewed for each problem: Lab Results  Component Value Date   WBC 5.0 08/29/2020   HGB  14.0 08/29/2020   HCT 41.0 08/29/2020   PLT 177.0 08/29/2020   GLUCOSE 83 08/29/2020   CHOL 209 (H) 11/16/2019   TRIG 93.0 11/16/2019   HDL 79.50 11/16/2019   LDLDIRECT 134.0 10/20/2013   LDLCALC 110 (H) 11/16/2019   ALT 17 08/29/2020   AST 17 08/29/2020   NA 138 08/29/2020   K 4.5 08/29/2020   CL 104 08/29/2020   CREATININE 0.78 08/29/2020   BUN 24 (H) 08/29/2020   CO2 30 08/29/2020   TSH 1.45 08/29/2020    Micro: none  Cardiac tracings I have personally interpreted today:  none  Pertinent Radiological findings (summarize): 12/13/2019 ls spine films   I spent total 33 minutes in caring for the patient for this visit:  1) by communicating with the patient  during the visit  2) by review of pertinent vital sign data, physical examination and labs as documented in the assessment and plan  3) by review of pertinent imaging - as above  4) by review of pertinent procedures - none today  5) by obtaining and reviewing separately obtained information from family/caretaker and Care Everywhere - none today  6) by ordering medications  7) by ordering tests  8) by documenting all of this clinical information in the EHR including the management of each problem noted today in assessment and plan  Cathlean Cower, MD 09/11/2020 8:47 PM [facility]   Problem List Items Addressed This Visit      High   Muscle cramps   Relevant Orders   Magnesium (Completed)   TSH (Completed)   Mild peripheral edema    None today on low dose hct,  to f/u any worsening symptoms or concerns      Relevant Orders   CBC with Differential/Platelet (Completed)   Hyperparathyroidism ([address])    For f/u  lab , consider refer to Gen surgury      Relevant Orders   Hepatic function panel (Completed)   Basic metabolic panel (Completed)   PTH, intact and calcium (Completed)   VITAMIN D 25 Hydroxy (Vit-D Deficiency, Fractures) (Completed)   Hypercalcemia - Primary    More likely related to hyperparathyroidism than hct use,  to f/u any worsening symptoms or concerns        Medium   Right sided sciatica   Relevant Medications   tizanidine (ZANAFLEX) 2 MG capsule   Anxiety state      Meds ordered this encounter  Medications   tizanidine (ZANAFLEX) 2 MG capsule    Sig: Take 1 capsule (2 mg total) by mouth 3 (three) times daily.    Dispense:  40 capsule    Refill:  2    Follow-up: TBD    Cathlean Cower, MD

## 2020-09-11 NOTE — Assessment & Plan Note (Signed)
Has been gabapentin intolerant, declines other med trial for now,  to f/u any worsening symptoms or concerns

## 2020-09-11 NOTE — Assessment & Plan Note (Signed)
stable overall by history and exam, recent data reviewed with pt, and pt to continue medical treatment as before,  to f/u any worsening symptoms or concerns  

## 2020-09-20 ENCOUNTER — Ambulatory Visit: Payer: Medicare HMO

## 2020-09-26 ENCOUNTER — Ambulatory Visit (INDEPENDENT_AMBULATORY_CARE_PROVIDER_SITE_OTHER): Payer: Medicare HMO

## 2020-09-26 ENCOUNTER — Other Ambulatory Visit: Payer: Self-pay

## 2020-09-26 VITALS — BP 118/70 | HR 62 | Temp 97.6°F | Ht 65.0 in | Wt 150.0 lb

## 2020-09-26 DIAGNOSIS — Z0000 Encounter for general adult medical examination without abnormal findings: Secondary | ICD-10-CM

## 2020-09-26 NOTE — Patient Instructions (Addendum)
[name] , Thank you for taking time to come for your Medicare Wellness Visit. I appreciate your ongoing commitment to your health goals. Please review the following plan we discussed and let me know if I can assist you in the future.   Screening recommendations/referrals: Colonoscopy: last done on 01/28/2020 Mammogram: 01/27/2013 Bone Density: 11/17/2017; due every 2 years Recommended yearly ophthalmology/optometry visit for glaucoma screening and checkup Recommended yearly dental visit for hygiene and checkup  Vaccinations: Influenza vaccine: 06/16/2020 Pneumococcal vaccine: up to date Tdap vaccine: overdue; check with local pharmacy Shingles vaccine: never done; check with local pharmacy   Covid-19: up to date  Advanced directives: Please bring a copy of your health care power of attorney and living will to the office at your convenience.  Conditions/risks identified: Yes; Reviewed health maintenance screenings with patient today and relevant education, vaccines, and/or referrals were provided. Please continue to do your personal lifestyle choices by: daily care of teeth and gums, regular physical activity (goal should be 5 days a week for 30 minutes), eat a healthy diet, avoid tobacco and drug use, limiting any alcohol intake, taking a low-dose aspirin (if not allergic or have been advised by your provider otherwise) and taking vitamins and minerals as recommended by your provider. Continue doing brain stimulating activities (puzzles, reading, adult coloring books, staying active) to keep memory sharp. Continue to eat heart healthy diet (full of fruits, vegetables, whole grains, lean protein, water--limit salt, fat, and sugar intake) and increase physical activity as tolerated.  Next appointment: Please schedule your next Medicare Wellness Visit with your Nurse Health Advisor in 1 year by calling [phone].  Preventive Care 33 Years and Older, Female Preventive care refers to lifestyle  choices and visits with your health care provider that can promote health and wellness. What does preventive care include?  A yearly physical exam. This is also called an annual well check.  Dental exams once or twice a year.  Routine eye exams. Ask your health care provider how often you should have your eyes checked.  Personal lifestyle choices, including:  Daily care of your teeth and gums.  Regular physical activity.  Eating a healthy diet.  Avoiding tobacco and drug use.  Limiting alcohol use.  Practicing safe sex.  Taking low-dose aspirin every day.  Taking vitamin and mineral supplements as recommended by your health care provider. What happens during an annual well check? The services and screenings done by your health care provider during your annual well check will depend on your age, overall health, lifestyle risk factors, and family history of disease. Counseling  Your health care provider may ask you questions about your:  Alcohol use.  Tobacco use.  Drug use.  Emotional well-being.  Home and relationship well-being.  Sexual activity.  Eating habits.  History of falls.  Memory and ability to understand (cognition).  Work and work Statistician.  Reproductive health. Screening  You may have the following tests or measurements:  Height, weight, and BMI.  Blood pressure.  Lipid and cholesterol levels. These may be checked every 5 years, or more frequently if you are over 52 years old.  Skin check.  Lung cancer screening. You may have this screening every year starting at age 59 if you have a 30-pack-year history of smoking and currently smoke or have quit within the past 15 years.  Fecal occult blood test (FOBT) of the stool. You may have this test every year starting at age 21.  Flexible sigmoidoscopy or colonoscopy. You  may have a sigmoidoscopy every 5 years or a colonoscopy every 10 years starting at age 59.  Hepatitis C blood  test.  Hepatitis B blood test.  Sexually transmitted disease (STD) testing.  Diabetes screening. This is done by checking your blood sugar (glucose) after you have not eaten for a while (fasting). You may have this done every 1-3 years.  Bone density scan. This is done to screen for osteoporosis. You may have this done starting at age 32.  Mammogram. This may be done every 1-2 years. Talk to your health care provider about how often you should have regular mammograms. Talk with your health care provider about your test results, treatment options, and if necessary, the need for more tests. Vaccines  Your health care provider may recommend certain vaccines, such as:  Influenza vaccine. This is recommended every year.  Tetanus, diphtheria, and acellular pertussis (Tdap, Td) vaccine. You may need a Td booster every 10 years.  Zoster vaccine. You may need this after age 81.  Pneumococcal 13-valent conjugate (PCV13) vaccine. One dose is recommended after age 63.  Pneumococcal polysaccharide (PPSV23) vaccine. One dose is recommended after age 68. Talk to your health care provider about which screenings and vaccines you need and how often you need them. This information is not intended to replace advice given to you by your health care provider. Make sure you discuss any questions you have with your health care provider. Document Released: 09/29/2015 Document Revised: 05/22/2016 Document Reviewed: 07/04/2015 Elsevier Interactive Patient Education  2017 [place] Prevention in the Home Falls can cause injuries. They can happen to people of all ages. There are many things you can do to make your home safe and to help prevent falls. What can I do on the outside of my home?  Regularly fix the edges of walkways and driveways and fix any cracks.  Remove anything that might make you trip as you walk through a door, such as a raised step or threshold.  Trim any bushes or trees on the  path to your home.  Use bright outdoor lighting.  Clear any walking paths of anything that might make someone trip, such as rocks or tools.  Regularly check to see if handrails are loose or broken. Make sure that both sides of any steps have handrails.  Any raised decks and porches should have guardrails on the edges.  Have any leaves, snow, or ice cleared regularly.  Use sand or salt on walking paths during winter.  Clean up any spills in your garage right away. This includes oil or grease spills. What can I do in the bathroom?  Use night lights.  Install grab bars by the toilet and in the tub and shower. Do not use towel bars as grab bars.  Use non-skid mats or decals in the tub or shower.  If you need to sit down in the shower, use a plastic, non-slip stool.  Keep the floor dry. Clean up any water that spills on the floor as soon as it happens.  Remove soap buildup in the tub or shower regularly.  Attach bath mats securely with double-sided non-slip rug tape.  Do not have throw rugs and other things on the floor that can make you trip. What can I do in the bedroom?  Use night lights.  Make sure that you have a light by your bed that is easy to reach.  Do not use any sheets or blankets that are too big for  your bed. They should not hang down onto the floor.  Have a firm chair that has side arms. You can use this for support while you get dressed.  Do not have throw rugs and other things on the floor that can make you trip. What can I do in the kitchen?  Clean up any spills right away.  Avoid walking on wet floors.  Keep items that you use a lot in easy-to-reach places.  If you need to reach something above you, use a strong step stool that has a grab bar.  Keep electrical cords out of the way.  Do not use floor polish or wax that makes floors slippery. If you must use wax, use non-skid floor wax.  Do not have throw rugs and other things on the floor that can  make you trip. What can I do with my stairs?  Do not leave any items on the stairs.  Make sure that there are handrails on both sides of the stairs and use them. Fix handrails that are broken or loose. Make sure that handrails are as long as the stairways.  Check any carpeting to make sure that it is firmly attached to the stairs. Fix any carpet that is loose or worn.  Avoid having throw rugs at the top or bottom of the stairs. If you do have throw rugs, attach them to the floor with carpet tape.  Make sure that you have a light switch at the top of the stairs and the bottom of the stairs. If you do not have them, ask someone to add them for you. What else can I do to help prevent falls?  Wear shoes that:  Do not have high heels.  Have rubber bottoms.  Are comfortable and fit you well.  Are closed at the toe. Do not wear sandals.  If you use a stepladder:  Make sure that it is fully opened. Do not climb a closed stepladder.  Make sure that both sides of the stepladder are locked into place.  Ask someone to hold it for you, if possible.  Clearly mark and make sure that you can see:  Any grab bars or handrails.  First and last steps.  Where the edge of each step is.  Use tools that help you move around (mobility aids) if they are needed. These include:  Canes.  Walkers.  Scooters.  Crutches.  Turn on the lights when you go into a dark area. Replace any light bulbs as soon as they burn out.  Set up your furniture so you have a clear path. Avoid moving your furniture around.  If any of your floors are uneven, fix them.  If there are any pets around you, be aware of where they are.  Review your medicines with your doctor. Some medicines can make you feel dizzy. This can increase your chance of falling. Ask your doctor what other things that you can do to help prevent falls. This information is not intended to replace advice given to you by your health care  provider. Make sure you discuss any questions you have with your health care provider. Document Released: 06/29/2009 Document Revised: 02/08/2016 Document Reviewed: 10/07/2014 Elsevier Interactive Patient Education  2017 [employer].

## 2020-09-26 NOTE — Progress Notes (Signed)
Subjective:   Robyn Bailey is a 80 y.o. female who presents for Medicare Annual (Subsequent) preventive examination.  Review of Systems    No ROS. Medicare Wellness Visit. Additional risk factors are reflected in social history. Cardiac Risk Factors include: advanced age (>6655men, 86>65 women);dyslipidemia     Objective:    Today's Vitals   09/26/20 1031  BP: 118/70  Pulse: 62  Temp: 97.6 F (36.4 C)  SpO2: 99%  Weight: 150 lb (68 kg)  Height: 5\' 5"  (1.651 m)  PainSc: 0-No pain   Body mass index is 24.96 kg/m.  Advanced Directives 09/26/2020 11/12/2018 10/02/2018 05/28/2018 11/11/2017 11/08/2016 12/14/2014  Does Patient Have a Medical Advance Directive? Yes Yes Yes Yes Yes No Yes  Type of Advance Directive Living will Healthcare Power of ComfortAttorney;Living will Healthcare Power of [place]orney;Living will Healthcare Power of [place];Living will Healthcare Power of [place]orney;Living will - Healthcare Power of [place]orney;Living will  Does patient want to make changes to medical advance directive? No - Patient declined - No - Patient declined - - - No - Patient declined  Copy of Healthcare Power of Attorney in Chart? - No - copy requested - - No - copy requested - No - copy requested    Current Medications (verified) Outpatient Encounter Medications as of 09/26/2020  Medication Sig  . cetirizine (ZYRTEC) 10 MG tablet Take 1 tablet (10 mg total) by mouth daily.  . hydrochlorothiazide (MICROZIDE) 12.5 MG capsule Take 1 capsule (12.5 mg total) by mouth daily.  Marland Kitchen. ketoconazole (NIZORAL) 2 % shampoo Apply topically.  . meloxicam (MOBIC) 15 MG tablet Take 1 tablet (15 mg total) by mouth daily.  Marland Kitchen. MINOXIDIL, TOPICAL, 5 % SOLN Apply topically 2 (two) times daily. Uses once daily  . pantoprazole (PROTONIX) 40 MG tablet Take 1 tablet (40 mg total) by mouth 2 (two) times daily.  . Probiotic Product (ALIGN PO) Take by mouth daily. PRN  . tizanidine (ZANAFLEX) 2 MG capsule Take 1 capsule (2 mg total) by  mouth 3 (three) times daily.  Marland Kitchen. triamcinolone (NASACORT AQ) 55 MCG/ACT AERO nasal inhaler Place 2 sprays into the nose daily. (Patient taking differently: Place 2 sprays into the nose daily. PRN)   No facility-administered encounter medications on file as of 09/26/2020.    Allergies (verified) Patient has no known allergies.   History: Past Medical History:  Diagnosis Date  . ABNORMAL ELECTROCARDIOGRAM 07/19/2007  . ALLERGIC RHINITIS 07/19/2007  . Anatomical narrow angle glaucoma 04/20/2009  . ANXIETY 07/19/2007  . BACK PAIN 11/08/2009  . Blepharospasm 06/06/2008  . COLONIC POLYPS, HX OF 07/19/2007  . Cough 09/22/2007  . DEPRESSION 07/19/2007  . DIVERTICULOSIS, COLON 07/19/2007  . FATIGUE 04/20/2009  . GERD (gastroesophageal reflux disease)   . HYPERLIPIDEMIA 07/19/2007  . Left sided sciatica 01/22/2011  . LOW BACK PAIN 07/19/2007  . OSTEOARTHRITIS 07/19/2007  . OSTEOPOROSIS 07/19/2007  . PERIPHERAL NEUROPATHY 06/08/2007  . Ptosis of eyelid, bilateral   . RHINITIS 02/27/2009  . SINUSITIS- ACUTE-NOS 11/04/2007  . SORE THROAT 02/27/2009   Past Surgical History:  Procedure Laterality Date  . BREAST BIOPSY    . COLONOSCOPY    . EYE SURGERY     cataract  . hammer toe surgury  2003   bilateral  . PTOSIS REPAIR Bilateral 10/12/2018   Procedure: Bilateral ptosis eyelid correction external approach suture technique;  Surgeon: Glenna Fellowshimmappa, Brinda, MD;  Location: [facility];  Service: Plastics;  Laterality: Bilateral;  . TONSILLECTOMY     Family  History  Problem Relation Age of Onset  . Colon cancer Mother   . Colon cancer Maternal Uncle   . Esophageal cancer Neg Hx   . Rectal cancer Neg Hx   . Stomach cancer Neg Hx    Social History   Socioeconomic History  . Marital status: Single    Spouse name: Not on file  . Number of children: Not on file  . Years of education: Not on file  . Highest education level: Not on file  Occupational History  . 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 Occupation: part time research legal  Tobacco Use  . Smoking status: Former Research scientist (life sciences)  . Smokeless tobacco: Never Used  Vaping Use  . Vaping Use: Never used  Substance and Sexual Activity  . Alcohol use: Yes    Alcohol/week: 0.0 standard drinks    Comment: occasional glass of wine; rare now b/c acid reflux  . Drug use: No  . Sexual activity: Not Currently    Birth control/protection: Post-menopausal  Other Topics Concern  . Not on file  Social History Narrative  . Not on file   Social Determinants of Health   Financial Resource Strain: Low Risk   . Difficulty of Paying Living Expenses: Not hard at all  Food Insecurity: No Food Insecurity  . Worried About Charity fundraiser in the Last Year: Never true  . Ran Out of Food in the Last Year: Never true  Transportation Needs: No Transportation Needs  . Lack of Transportation (Medical): No  . Lack of Transportation (Non-Medical): No  Physical Activity: Sufficiently Active  . Days of Exercise per Week: 5 days  . Minutes of Exercise per Session: 30 min  Stress: No Stress Concern Present  . Feeling of Stress : Not at all  Social Connections: Socially Integrated  . Frequency of Communication with Friends and Family: More than three times a week  . Frequency of Social Gatherings with Friends and Family: Three times a week  . Attends Religious Services: More than 4 times per year  . Active Member of Clubs or Organizations: Yes  . Attends Archivist Meetings: More than 4 times per year  . Marital Status: Living with partner    Tobacco Counseling Counseling given: Not Answered   Clinical Intake:  Pre-visit preparation completed: Yes  Pain : No/denies pain Pain Score: 0-No pain     BMI - recorded: 24.96 Nutritional Status: BMI of 19-24  Normal Nutritional Risks: None Diabetes: No  How often do you need to have someone help you when you read instructions, pamphlets, or other written  materials from your doctor or pharmacy?: 1 - Never What is the last grade level you completed in school?: College Graduate  Diabetic? no  Interpreter Needed?: No  Information entered by :: Lisette Abu, LPN   Activities of Daily Living In your present state of health, do you have any difficulty performing the following activities: 09/26/2020  Hearing? N  Vision? N  Difficulty concentrating or making decisions? N  Walking or climbing stairs? N  Dressing or bathing? N  Doing errands, shopping? N  Preparing Food and eating ? N  Using the Toilet? N  In the past six months, have you accidently leaked urine? N  Do you have problems with loss of bowel control? N  Managing your Medications? N  Managing your Finances? N  Housekeeping or managing your Housekeeping? N  Some recent data might be hidden  Patient Care Team: Biagio Borg, MD as PCP - General Irene Limbo, MD as Consulting Physician (Plastic Surgery)  Indicate any recent Medical Services you may have received from other than [facility] providers in the past year (date may be approximate).     Assessment:   This is a routine wellness examination for Robyn Bailey.  Hearing/Vision screen No exam data present  Dietary issues and exercise activities discussed: Current Exercise Habits: Home exercise routine, Type of exercise: walking;yoga;Other - see comments;stretching (balance & core strength exercises; very active), Time (Minutes): 30, Frequency (Times/Week): 5, Weekly Exercise (Minutes/Week): 150, Intensity: Moderate, Exercise limited by: None identified  Goals    . Patient Stated     Eat healthy and exercise to lower my cholesterol. I will increase the amount of water I drink daily. Enjoy life, friends and baking. Plan to plant a garden, continue to volunteer in the community.    . Patient Stated     Continue to take care of me by eating healthy, staying active physically and socially.       Depression Screen PHQ  2/9 Scores 09/26/2020 02/29/2020 11/17/2019 11/12/2018 11/11/2017 11/11/2017 11/08/2016  PHQ - 2 Score 0 0 0 0 0 0 0  PHQ- 9 Score - 0 - 0 0 0 -    Fall Risk Fall Risk  09/26/2020 02/29/2020 11/17/2019 11/12/2018 11/11/2017  Falls in the past year? 0 0 0 0 No  Number falls in past yr: 0 0 - - -  Injury with Fall? 0 0 - - -  Risk for fall due to : No Fall Risks No Fall Risks - - -  Follow up - Falls evaluation completed - - -    FALL RISK PREVENTION PERTAINING TO THE HOME:  Any stairs in or around the home? Yes  If so, are there any without handrails? No  Home free of loose throw rugs in walkways, pet beds, electrical cords, etc? Yes  Adequate lighting in your home to reduce risk of falls? Yes   ASSISTIVE DEVICES UTILIZED TO PREVENT FALLS:  Life alert? No  Use of a cane, walker or w/c? No  Grab bars in the bathroom? Yes  Shower chair or bench in shower? Yes  Elevated toilet seat or a handicapped toilet? Yes   TIMED UP AND GO:  Was the test performed? No .  Length of time to ambulate 10 feet: 0 sec.   Gait steady and fast without use of assistive device  Cognitive Function:  Normal cognitive status assessed by direct observation by this Nurse Health Advisor. No abnormalities found.   MMSE - Mini Mental State Exam 11/11/2017  Orientation to time 5  Orientation to Place 5  Registration 3  Attention/ Calculation 5  Recall 3  Language- name 2 objects 2  Language- repeat 1  Language- follow 3 step command 3  Language- read & follow direction 1  Write a sentence 1  Copy design 1  Total score 30        Immunizations Immunization History  Administered Date(s) Administered  . Fluad Quad(high Dose 65+) 07/22/2019, 06/16/2020  . PFIZER SARS-COV-2 Vaccination 11/08/2019, 12/01/2019  . Pneumococcal Conjugate-13 10/20/2013  . Pneumococcal Polysaccharide-23 05/17/2006  . Td 04/20/2009    TDAP status: Due, Education has been provided regarding the importance of this vaccine. Advised  may receive this vaccine at local pharmacy or Health Dept. Aware to provide a copy of the vaccination record if obtained from local pharmacy or Health Dept. Verbalized acceptance and  understanding.  Flu Vaccine status: Up to date  Pneumococcal vaccine status: Up to date  Covid-19 vaccine status: Completed vaccines  Qualifies for Shingles Vaccine? Yes   Zostavax completed No   Shingrix Completed?: No.    Education has been provided regarding the importance of this vaccine. Patient has been advised to call insurance company to determine out of pocket expense if they have not yet received this vaccine. Advised may also receive vaccine at local pharmacy or Health Dept. Verbalized acceptance and understanding.  Screening Tests Health Maintenance  Topic Date Due  . Hepatitis C Screening  Never done  . COVID-19 Vaccine (3 - Booster for Pfizer series) 06/02/2020  . TETANUS/TDAP  11/16/2020 (Originally 04/21/2019)  . INFLUENZA VACCINE  Completed  . DEXA SCAN  Completed  . PNA vac Low Risk Adult  Completed    Health Maintenance  Health Maintenance Due  Topic Date Due  . Hepatitis C Screening  Never done  . COVID-19 Vaccine (3 - Booster for Pfizer series) 06/02/2020    Colorectal cancer screening: No longer required.   Mammogram status: No longer required due to patient refusal.  Bone Density status: Completed 11/17/2017. Results reflect: Bone density results: OSTEOPOROSIS. Repeat every 2 years.  Lung Cancer Screening: (Low Dose CT Chest recommended if Age 48-80 years, 30 pack-year currently smoking OR have quit w/in 15years.) does not qualify.   Lung Cancer Screening Referral: no  Additional Screening:  Hepatitis C Screening: does qualify; Completed no  Vision Screening: Recommended annual ophthalmology exams for early detection of glaucoma and other disorders of the eye. Is the patient up to date with their annual eye exam?  Yes  Who is the provider or what is the name of the office  in which the patient attends annual eye exams? Katy Apo, MD. If pt is not established with a provider, would they like to be referred to a provider to establish care? No .   Dental Screening: Recommended annual dental exams for proper oral hygiene  Community Resource Referral / Chronic Care Management: CRR required this visit?  No   CCM required this visit?  No      Plan:     I have personally reviewed and noted the following in the patient's chart:   . Medical and social history . Use of alcohol, tobacco or illicit drugs  . Current medications and supplements . Functional ability and status . Nutritional status . Physical activity . Advanced directives . List of other physicians . Hospitalizations, surgeries, and ER visits in previous 12 months . Vitals . Screenings to include cognitive, depression, and falls . Referrals and appointments  In addition, I have reviewed and discussed with patient certain preventive protocols, quality metrics, and best practice recommendations. A written personalized care plan for preventive services as well as general preventive health recommendations were provided to patient.     Sheral Flow, LPN   12/23/8117   Nurse Notes: n/a

## 2020-10-11 ENCOUNTER — Other Ambulatory Visit: Payer: Self-pay | Admitting: Internal Medicine

## 2020-11-14 DIAGNOSIS — M810 Age-related osteoporosis without current pathological fracture: Secondary | ICD-10-CM | POA: Diagnosis not present

## 2020-11-14 DIAGNOSIS — E210 Primary hyperparathyroidism: Secondary | ICD-10-CM | POA: Diagnosis not present

## 2020-11-16 ENCOUNTER — Other Ambulatory Visit: Payer: Self-pay

## 2020-11-16 ENCOUNTER — Other Ambulatory Visit (HOSPITAL_COMMUNITY): Payer: Self-pay | Admitting: Surgery

## 2020-11-16 DIAGNOSIS — E210 Primary hyperparathyroidism: Secondary | ICD-10-CM

## 2020-11-17 ENCOUNTER — Encounter: Payer: Self-pay | Admitting: Internal Medicine

## 2020-11-17 ENCOUNTER — Ambulatory Visit (INDEPENDENT_AMBULATORY_CARE_PROVIDER_SITE_OTHER): Payer: Medicare HMO | Admitting: Internal Medicine

## 2020-11-17 VITALS — BP 116/68 | HR 65 | Ht 65.0 in | Wt 150.0 lb

## 2020-11-17 DIAGNOSIS — Z1159 Encounter for screening for other viral diseases: Secondary | ICD-10-CM

## 2020-11-17 DIAGNOSIS — E213 Hyperparathyroidism, unspecified: Secondary | ICD-10-CM | POA: Diagnosis not present

## 2020-11-17 DIAGNOSIS — E559 Vitamin D deficiency, unspecified: Secondary | ICD-10-CM

## 2020-11-17 DIAGNOSIS — Z0000 Encounter for general adult medical examination without abnormal findings: Secondary | ICD-10-CM

## 2020-11-17 DIAGNOSIS — E7800 Pure hypercholesterolemia, unspecified: Secondary | ICD-10-CM

## 2020-11-17 DIAGNOSIS — R69 Illness, unspecified: Secondary | ICD-10-CM | POA: Diagnosis not present

## 2020-11-17 DIAGNOSIS — F32A Depression, unspecified: Secondary | ICD-10-CM

## 2020-11-17 DIAGNOSIS — Z0001 Encounter for general adult medical examination with abnormal findings: Secondary | ICD-10-CM

## 2020-11-17 LAB — URINALYSIS, ROUTINE W REFLEX MICROSCOPIC
Bilirubin Urine: NEGATIVE
Ketones, ur: NEGATIVE
Leukocytes,Ua: NEGATIVE
Nitrite: NEGATIVE
Specific Gravity, Urine: >=1.03 — AB (ref 1.000–1.030)
Total Protein, Urine: NEGATIVE
Urine Glucose: NEGATIVE
Urobilinogen, UA: 0.2 (ref 0.0–1.0)
WBC, UA: NONE SEEN (ref 0–?)
pH: 5.5 (ref 5.0–8.0)

## 2020-11-17 LAB — LIPID PANEL
Cholesterol: 215 mg/dL — ABNORMAL HIGH (ref 0–200)
HDL: 81.6 mg/dL (ref >39.00)
LDL Cholesterol: 112 mg/dL — ABNORMAL HIGH (ref 0–99)
NonHDL: 133.33
Total CHOL/HDL Ratio: 3
Triglycerides: 109 mg/dL (ref 0.0–149.0)
VLDL: 21.8 mg/dL (ref 0.0–40.0)

## 2020-11-17 LAB — BASIC METABOLIC PANEL
BUN: 27 mg/dL — ABNORMAL HIGH (ref 6–23)
CO2: 31 mEq/L (ref 19–32)
Calcium: 12 mg/dL — ABNORMAL HIGH (ref 8.4–10.5)
Chloride: 103 mEq/L (ref 96–112)
Creatinine, Ser: 0.82 mg/dL (ref 0.40–1.20)
GFR: 67.84 mL/min (ref >60.00)
Glucose, Bld: 79 mg/dL (ref 70–99)
Potassium: 4.4 mEq/L (ref 3.5–5.1)
Sodium: 139 mEq/L (ref 135–145)

## 2020-11-17 LAB — CBC WITH DIFFERENTIAL/PLATELET
Basophils Absolute: 0 10*3/uL (ref 0.0–0.1)
Basophils Relative: 0.4 % (ref 0.0–3.0)
Eosinophils Absolute: 0.1 10*3/uL (ref 0.0–0.7)
Eosinophils Relative: 3.2 % (ref 0.0–5.0)
HCT: 42.7 % (ref 36.0–46.0)
Hemoglobin: 14.4 g/dL (ref 12.0–15.0)
Lymphocytes Relative: 43.8 % (ref 12.0–46.0)
Lymphs Abs: 1.7 10*3/uL (ref 0.7–4.0)
MCHC: 33.8 g/dL (ref 30.0–36.0)
MCV: 84.8 fl (ref 78.0–100.0)
Monocytes Absolute: 0.5 10*3/uL (ref 0.1–1.0)
Monocytes Relative: 12.1 % — ABNORMAL HIGH (ref 3.0–12.0)
Neutro Abs: 1.6 10*3/uL (ref 1.4–7.7)
Neutrophils Relative %: 40.5 % — ABNORMAL LOW (ref 43.0–77.0)
Platelets: 193 10*3/uL (ref 150.0–400.0)
RBC: 5.03 Mil/uL (ref 3.87–5.11)
RDW: 13 % (ref 11.5–15.5)
WBC: 4 10*3/uL (ref 4.0–10.5)

## 2020-11-17 LAB — TSH: TSH: 1.16 u[IU]/mL (ref 0.35–4.50)

## 2020-11-17 LAB — HEPATIC FUNCTION PANEL
ALT: 16 U/L (ref 0–35)
AST: 18 U/L (ref 0–37)
Albumin: 4.1 g/dL (ref 3.5–5.2)
Alkaline Phosphatase: 94 U/L (ref 39–117)
Bilirubin, Direct: 0.1 mg/dL (ref 0.0–0.3)
Total Bilirubin: 0.7 mg/dL (ref 0.2–1.2)
Total Protein: 7.1 g/dL (ref 6.0–8.3)

## 2020-11-17 LAB — VITAMIN D 25 HYDROXY (VIT D DEFICIENCY, FRACTURES): VITD: 47.08 ng/mL (ref 30.00–100.00)

## 2020-11-17 NOTE — Assessment & Plan Note (Signed)
Much improved as of late with resolved back and leg pain,  to f/u any worsening symptoms or concerns

## 2020-11-17 NOTE — Patient Instructions (Addendum)

## 2020-11-17 NOTE — Progress Notes (Signed)
Patient ID: Robyn Bailey, female   DOB: [birth year], 80 y.o.   MRN: [id number]         Chief Complaint:: wellness exam and elevated calcium and PTH, depression, HLD, vit d def       HPI:  Robyn Bailey is a 80 y.o. female here for wellness exam; due for hep c screen, declines covid booster or tdap                        Also quite happy and feels like a new person with Now much improved right lower back and leg pain miserable for 14 months now healed with lots of rest.  Also saw Dr gerkin for hyperparathyroidism/hypercalcwmi, asks for f/u lab.  Denies worsening depressive symptoms, suicidal ideation, or panic Pt denies chest pain, increased sob or doe, wheezing, orthopnea, PND, increased LE swelling, palpitations, dizziness or syncope.  Denies new worsening focal neuro s/s.   Pt denies polydipsia, polyuria,  Pt denies fever, wt loss, night sweats, loss of appetite, or other constitutional symptoms    Wt Readings from Last 3 Encounters:  11/17/20 150 lb (68 kg)  09/26/20 150 lb (68 kg)  08/29/20 148 lb (67.1 kg)   BP Readings from Last 3 Encounters:  11/17/20 116/68  09/26/20 118/70  08/29/20 120/72   Immunization History  Administered Date(s) Administered  . Fluad Quad(high Dose 65+) 07/22/2019, 06/16/2020  . PFIZER(Purple Top)SARS-COV-2 Vaccination 11/08/2019, 12/01/2019  . Pneumococcal Conjugate-13 10/20/2013  . Pneumococcal Polysaccharide-23 05/17/2006  . Td 04/20/2009   Health Maintenance Due  Topic Date Due  . Hepatitis C Screening  Never done      Past Medical History:  Diagnosis Date  . ABNORMAL ELECTROCARDIOGRAM 07/19/2007  . ALLERGIC RHINITIS 07/19/2007  . Anatomical narrow angle glaucoma 04/20/2009  . ANXIETY 07/19/2007  . BACK PAIN 11/08/2009  . Blepharospasm 06/06/2008  . COLONIC POLYPS, HX OF 07/19/2007  . Cough 09/22/2007  . DEPRESSION 07/19/2007  . DIVERTICULOSIS, COLON 07/19/2007  . FATIGUE 04/20/2009  . GERD (gastroesophageal reflux disease)   . HYPERLIPIDEMIA  07/19/2007  . Left sided sciatica 01/22/2011  . LOW BACK PAIN 07/19/2007  . OSTEOARTHRITIS 07/19/2007  . OSTEOPOROSIS 07/19/2007  . PERIPHERAL NEUROPATHY 06/08/2007  . Ptosis of eyelid, bilateral   . RHINITIS 02/27/2009  . SINUSITIS- ACUTE-NOS 11/04/2007  . SORE THROAT 02/27/2009   Past Surgical History:  Procedure Laterality Date  . BREAST BIOPSY    . COLONOSCOPY    . EYE SURGERY     cataract  . hammer toe surgury  2003   bilateral  . PTOSIS REPAIR Bilateral 10/12/2018   Procedure: Bilateral ptosis eyelid correction external approach suture technique;  Surgeon: Irene Limbo, MD;  Location: [facility];  Service: Plastics;  Laterality: Bilateral;  . TONSILLECTOMY      reports that she has quit smoking. She has never used smokeless tobacco. She reports current alcohol use. She reports that she does not use drugs. family history includes Colon cancer in her maternal uncle and mother. No Known Allergies Current Outpatient Medications on File Prior to Visit  Medication Sig Dispense Refill  . cetirizine (ZYRTEC) 10 MG tablet Take 1 tablet (10 mg total) by mouth daily. 30 tablet 11  . hydrochlorothiazide (MICROZIDE) 12.5 MG capsule Take 1 capsule (12.5 mg total) by mouth daily. 30 capsule 11  . ketoconazole (NIZORAL) 2 % shampoo Apply topically.    . meloxicam (MOBIC) 15 MG tablet Take 1 tablet (15 mg total)  by mouth daily. 90 tablet 3  . MINOXIDIL, TOPICAL, 5 % SOLN Apply topically 2 (two) times daily. Uses once daily    . pantoprazole (PROTONIX) 40 MG tablet Take 1 tablet (40 mg total) by mouth 2 (two) times daily. 180 tablet 3  . Probiotic Product (ALIGN PO) Take by mouth daily. PRN    . tizanidine (ZANAFLEX) 2 MG capsule TAKE ONE CAPSULE BY MOUTH THREE TIMES A DAY 40 capsule 2  . triamcinolone (NASACORT AQ) 55 MCG/ACT AERO nasal inhaler Place 2 sprays into the nose daily. (Patient taking differently: Place 2 sprays into the nose daily. PRN) 1 Inhaler 12   No current  facility-administered medications on file prior to visit.        ROS:  All others reviewed and negative.  Objective        PE:  BP 116/68   Pulse 65   Ht 5\' 5"  (1.651 m)   Wt 150 lb (68 kg)   SpO2 97%   BMI 24.96 kg/m                 Constitutional: Pt appears in NAD               HENT: Head: NCAT.                Right Ear: External ear normal.                 Left Ear: External ear normal.                Eyes: . Pupils are equal, round, and reactive to light. Conjunctivae and EOM are normal               Nose: without d/c or deformity               Neck: Neck supple. Gross normal ROM               Cardiovascular: Normal rate and regular rhythm.                 Pulmonary/Chest: Effort normal and breath sounds without rales or wheezing.                Abd:  Soft, NT, ND, + BS, no organomegaly               Neurological: Pt is alert. At baseline orientation, motor grossly intact               Skin: Skin is warm. No rashes, no other new lesions, LE edema - none               Psychiatric: Pt behavior is normal without agitation   Micro: none  Cardiac tracings I have personally interpreted today:  none  Pertinent Radiological findings (summarize): none   Lab Results  Component Value Date   WBC 4.0 11/17/2020   HGB 14.4 11/17/2020   HCT 42.7 11/17/2020   PLT 193.0 11/17/2020   GLUCOSE 79 11/17/2020   CHOL 215 (H) 11/17/2020   TRIG 109.0 11/17/2020   HDL 81.60 11/17/2020   LDLDIRECT 134.0 10/20/2013   LDLCALC 112 (H) 11/17/2020   ALT 16 11/17/2020   AST 18 11/17/2020   NA 139 11/17/2020   K 4.4 11/17/2020   CL 103 11/17/2020   CREATININE 0.82 11/17/2020   BUN 27 (H) 11/17/2020   CO2 31 11/17/2020   TSH 1.16 11/17/2020   Assessment/Plan:  Robyn Bailey is a  80 y.o. White or Caucasian [1] female with  has a past medical history of ABNORMAL ELECTROCARDIOGRAM (07/19/2007), ALLERGIC RHINITIS (07/19/2007), Anatomical narrow angle glaucoma (04/20/2009), ANXIETY (07/19/2007),  BACK PAIN (11/08/2009), Blepharospasm (06/06/2008), COLONIC POLYPS, HX OF (07/19/2007), Cough (09/22/2007), DEPRESSION (07/19/2007), DIVERTICULOSIS, COLON (07/19/2007), FATIGUE (04/20/2009), GERD (gastroesophageal reflux disease), HYPERLIPIDEMIA (07/19/2007), Left sided sciatica (01/22/2011), LOW BACK PAIN (07/19/2007), OSTEOARTHRITIS (07/19/2007), OSTEOPOROSIS (07/19/2007), PERIPHERAL NEUROPATHY (06/08/2007), Ptosis of eyelid, bilateral, RHINITIS (02/27/2009), SINUSITIS- ACUTE-NOS (11/04/2007), and SORE THROAT (02/27/2009).  Depression Much improved as of late with resolved back and leg pain,  to f/u any worsening symptoms or concerns  Encounter for well adult exam with abnormal findings Age and sex appropriate education and counseling updated with regular exercise and diet Referrals for preventative services - none needed except for hep c screen Immunizations addressed - declines covid booster or Tdap Smoking counseling  - none needed Evidence for depression or other mood disorder - none significant Most recent labs reviewed. I have personally reviewed and have noted: 1) the patient's medical and social history 2) The patient's current medications and supplements 3) The patient's height, weight, and BMI have been recorded in the chart   Hypercalcemia For f/u lab  Hyperparathyroidism [facility]) For f/u lab  HLD (hyperlipidemia) Lab Results  Component Value Date   LDLCALC 112 (H) 11/17/2020   Stable, pt to continue current die, declines statin   Vitamin D insufficiency Last vitamin D Lab Results  Component Value Date   VD25OH 47.08 11/17/2020   Stable, cont oral replacement  Followup: Return in about 6 months (around 05/20/2021).  Cathlean Cower, MD 11/19/2020 8:44 PM [facility]

## 2020-11-19 ENCOUNTER — Encounter: Payer: Self-pay | Admitting: Internal Medicine

## 2020-11-19 NOTE — Assessment & Plan Note (Signed)
For f/u lab 

## 2020-11-19 NOTE — Assessment & Plan Note (Signed)
Age and sex appropriate education and counseling updated with regular exercise and diet Referrals for preventative services - none needed except for hep c screen Immunizations addressed - declines covid booster or Tdap Smoking counseling  - none needed Evidence for depression or other mood disorder - none significant Most recent labs reviewed. I have personally reviewed and have noted: 1) the patient's medical and social history 2) The patient's current medications and supplements 3) The patient's height, weight, and BMI have been recorded in the chart

## 2020-11-19 NOTE — Assessment & Plan Note (Signed)
Last vitamin D Lab Results  Component Value Date   VD25OH 47.08 11/17/2020   Stable, cont oral replacement

## 2020-11-19 NOTE — Assessment & Plan Note (Signed)
Lab Results  Component Value Date   LDLCALC 112 (H) 11/17/2020   Stable, pt to continue current die, declines statin

## 2020-11-20 ENCOUNTER — Encounter: Payer: Self-pay | Admitting: Internal Medicine

## 2020-11-21 LAB — HEPATITIS C ANTIBODY
Hepatitis C Ab: NONREACTIVE
SIGNAL TO CUT-OFF: 0.01 (ref <1.00)

## 2020-11-21 LAB — PTH, INTACT AND CALCIUM
Calcium: 12 mg/dL — ABNORMAL HIGH (ref 8.6–10.4)
PTH: 136 pg/mL — ABNORMAL HIGH (ref 14–64)

## 2020-12-11 ENCOUNTER — Other Ambulatory Visit: Payer: Self-pay

## 2020-12-11 ENCOUNTER — Ambulatory Visit (HOSPITAL_COMMUNITY)
Admission: RE | Admit: 2020-12-11 | Discharge: 2020-12-11 | Disposition: A | Discharge status: Home / Self Care | Payer: Medicare HMO | Source: Ambulatory Visit | Attending: Surgery | Admitting: Surgery

## 2020-12-11 ENCOUNTER — Encounter (HOSPITAL_COMMUNITY)
Admission: RE | Admit: 2020-12-11 | Discharge: 2020-12-11 | Disposition: A | Discharge status: Home / Self Care | Payer: Medicare HMO | Source: Ambulatory Visit | Attending: Surgery | Admitting: Surgery

## 2020-12-11 DIAGNOSIS — E210 Primary hyperparathyroidism: Secondary | ICD-10-CM | POA: Insufficient documentation

## 2020-12-11 DIAGNOSIS — E041 Nontoxic single thyroid nodule: Secondary | ICD-10-CM | POA: Diagnosis not present

## 2020-12-11 MED ORDER — TECHNETIUM TC 99M SESTAMIBI - CARDIOLITE
25.4000 | Freq: Once | INTRAVENOUS | Status: AC
  Administered 2020-12-11: 25.4 via INTRAVENOUS

## 2020-12-18 DIAGNOSIS — Z961 Presence of intraocular lens: Secondary | ICD-10-CM | POA: Diagnosis not present

## 2020-12-18 DIAGNOSIS — H52202 Unspecified astigmatism, left eye: Secondary | ICD-10-CM | POA: Diagnosis not present

## 2020-12-18 DIAGNOSIS — H524 Presbyopia: Secondary | ICD-10-CM | POA: Diagnosis not present

## 2020-12-18 DIAGNOSIS — H04123 Dry eye syndrome of bilateral lacrimal glands: Secondary | ICD-10-CM | POA: Diagnosis not present

## 2020-12-26 ENCOUNTER — Other Ambulatory Visit: Payer: Self-pay | Admitting: Surgery

## 2020-12-26 ENCOUNTER — Other Ambulatory Visit: Payer: Self-pay | Admitting: Rheumatology

## 2020-12-26 DIAGNOSIS — E210 Primary hyperparathyroidism: Secondary | ICD-10-CM

## 2021-01-11 ENCOUNTER — Ambulatory Visit
Admission: RE | Admit: 2021-01-11 | Discharge: 2021-01-11 | Disposition: A | Discharge status: Home / Self Care | Payer: Medicare HMO | Source: Ambulatory Visit | Attending: Surgery | Admitting: Surgery

## 2021-01-11 DIAGNOSIS — D351 Benign neoplasm of parathyroid gland: Secondary | ICD-10-CM | POA: Diagnosis not present

## 2021-01-11 DIAGNOSIS — E210 Primary hyperparathyroidism: Secondary | ICD-10-CM

## 2021-01-11 DIAGNOSIS — E213 Hyperparathyroidism, unspecified: Secondary | ICD-10-CM | POA: Diagnosis not present

## 2021-01-11 DIAGNOSIS — E041 Nontoxic single thyroid nodule: Secondary | ICD-10-CM | POA: Diagnosis not present

## 2021-01-11 MED ORDER — IOPAMIDOL (ISOVUE-370) INJECTION 76%
75.0000 mL | Freq: Once | INTRAVENOUS | Status: AC | PRN
  Administered 2021-01-11: 75 mL via INTRAVENOUS

## 2021-01-16 ENCOUNTER — Ambulatory Visit: Payer: Self-pay | Admitting: Surgery

## 2021-01-25 ENCOUNTER — Other Ambulatory Visit: Payer: Self-pay | Admitting: Internal Medicine

## 2021-01-27 ENCOUNTER — Encounter (HOSPITAL_COMMUNITY): Payer: Self-pay | Admitting: Surgery

## 2021-01-27 NOTE — H&P (Signed)
General Surgery [facility]  Robyn Bailey DOB: 02/05/1941 Single / Language: Cleophus Molt / Race: White Female   History of Present Illness   The patient is a 80 year old female who presents with primary hyperparathyroidism.  CHIEF COMPLAINT: primary hyperparathyroidism  Patient is referred by Dr. Vivia Ewing for surgical evaluation and management of suspected primary hyperparathyroidism. Patient's primary care physician is Dr. Cathlean Cower. Patient was originally evaluated in my practice in 2020. We had requested an ultrasound and a nuclear medicine parathyroid scan which were never performed. Patient has continued follow-up with her primary care physician. Laboratories in December 2021 show an elevated serum calcium level of 11.500 elevated intact PTH level of 124. Vitamin D level was normal at 40.7. Previous 24-hour urine calcium level was elevated at 381. The patient returns today to discuss these findings and to discuss proceeding with further diagnostic testing.   Allergies Allergies Reconciled  No Known Drug Allergies   Medication History  Benadryl Allergy (25MG  Tablet, Oral) Active. Nasonex (50MCG/ACT Suspension, Nasal) Active. Minoxidil for Women (2% Solution, External) Active. Probiotic (Oral) Specific strength unknown - Active. Vitamin D (50000U Capsule, Oral) Active. Pantoprazole Sodium (40MG  Tablet DR, Oral) Active. Lacril (Ophthalmic) Active. Medications Reconciled  Vitals  Weight: 151.5 lb Height: 65.5in Body Surface Area: 1.77 m Body Mass Index: 24.83 kg/m  Temp.: 98.47F  Pulse: 66 (Regular)  P.OX: 98% (Room air) BP: 120/70(Sitting, Left Arm, Standard)  Physical Exam  GENERAL APPEARANCE Development: normal Nutritional status: normal Gross deformities: none  SKIN Rash, lesions, ulcers: none Induration, erythema: none Nodules: none palpable  EYES Conjunctiva and lids: normal Pupils: equal and  reactive Iris: normal bilaterally  EARS, NOSE, MOUTH, THROAT External ears: no lesion or deformity External nose: no lesion or deformity Hearing: grossly normal Due to Covid-19 pandemic, patient is wearing a mask.  NECK Symmetric: yes Trachea: midline Thyroid: no palpable nodules in the thyroid bed  CHEST Respiratory effort: normal Retraction or accessory muscle use: no Breath sounds: normal bilaterally Rales, rhonchi, wheeze: none  CARDIOVASCULAR Auscultation: regular rhythm, normal rate Murmurs: none Pulses: radial pulse 2+ palpable Lower extremity edema: none  MUSCULOSKELETAL Station and gait: normal Digits and nails: no clubbing or cyanosis Muscle strength: grossly normal all extremities Range of motion: grossly normal all extremities Deformity: none  LYMPHATIC Cervical: none palpable Supraclavicular: none palpable  PSYCHIATRIC Oriented to person, place, and time: yes Mood and affect: normal for situation Judgment and insight: appropriate for situation    Assessment & Plan  PRIMARY HYPERPARATHYROIDISM (E21.0) OSTEOPOROSIS (M81.0)  Follow Up - Call [facility] office after tests / studies doneto discuss further plans  Patient returns to my practice having previously been seen in 2020 for primary hyperparathyroidism. Laboratory studies continue to indicate primary hyperparathyroidism. Patient does have complications with osteoporosis.  I would like to proceed with an ultrasound examination of the neck as well as a nuclear medicine parathyroid scan. These studies can be performed concurrently as an outpatient procedure. We will make arrangements for the studies in the near future. If they indicate the presence of a parathyroid adenoma, but I believe she will be a good candidate for minimally invasive outpatient surgery. If these studies failed indicate the presence of a parathyroid adenoma, we will consider proceeding with a 4D CT scan of the neck.  The patient  and I briefly discussed minimally invasive parathyroid surgery as an outpatient procedure. Patient understands and agrees to proceed with further diagnostic testing.  We will contact the patient  with the results of her studies when they are available.   ADDENDUM  Telephone call to patient with positive CT scan result. Left inferior adenoma.  Plan minimally invasive parathyroidectomy as an out-patient surgery.  The risks and benefits of the procedure have been discussed at length with the patient. The patient understands the proposed procedure, potential alternative treatments, and the course of recovery to be expected. All of the patient's questions have been answered at this time. The patient wishes to proceed with surgery.  Armandina Gemma, [facility] Office: [phone]

## 2021-02-01 NOTE — Patient Instructions (Addendum)
DUE TO COVID-19 ONLY ONE VISITOR IS ALLOWED TO COME WITH YOU AND STAY IN THE WAITING ROOM ONLY DURING PRE OP AND PROCEDURE DAY OF SURGERY. THE 1 VISITOR  MAY VISIT WITH YOU AFTER SURGERY IN YOUR PRIVATE ROOM DURING VISITING HOURS ONLY!  YOU NEED TO HAVE A COVID 19 TEST ON: 02/06/21 @ 10:05 AM, THIS TEST MUST BE DONE BEFORE SURGERY,  COVID TESTING SITE [place] [address], IT IS ON THE RIGHT GOING OUT [address] APPROXIMATELY  2 MINUTES PAST [employer] ON THE RIGHT. ONCE YOUR COVID TEST IS COMPLETED,  PLEASE BEGIN THE QUARANTINE INSTRUCTIONS AS OUTLINED IN YOUR HANDOUT.                Robyn Bailey   Your procedure is scheduled on: 02/09/21   Report to [facility] Main  Entrance   Report to admitting at: 10:45 AM     Call this number if you have problems the morning of surgery [phone]    Remember: Do not eat solid food :After Midnight. Clear liquids until: 9:45 am.  CLEAR LIQUID DIET  Foods Allowed                                                                     Foods Excluded  Coffee and tea, regular and decaf                             liquids that you cannot  Plain Jell-O any favor except red or purple                                           see through such as: Fruit ices (not with fruit pulp)                                     milk, soups, orange juice  Iced Popsicles                                    All solid food Carbonated beverages, regular and diet                                    Cranberry, grape and apple juices Sports drinks like Gatorade Lightly seasoned clear broth or consume(fat free) Sugar, honey syrup  Sample Menu Breakfast                                Lunch                                     Supper Cranberry juice  Beef broth                            Chicken broth Jell-O                                     Grape juice                           Apple juice Coffee or tea                         Jell-O                                      Popsicle                                                Coffee or tea                        Coffee or tea  _____________________________________________________________________  BRUSH YOUR TEETH MORNING OF SURGERY AND RINSE YOUR MOUTH OUT, NO CHEWING GUM CANDY OR MINTS.    Take these medicines the morning of surgery with A SIP OF WATER: pantoprazole.                                You may not have any metal on your body including hair pins and              piercings  Do not wear jewelry, make-up, lotions, powders or perfumes, deodorant             Do not wear nail polish on your fingernails.  Do not shave  48 hours prior to surgery.    Do not bring valuables to the hospital. [place].  Contacts, dentures or bridgework may not be worn into surgery.  Leave suitcase in the car. After surgery it may be brought to your room.     Patients discharged the day of surgery will not be allowed to drive home. IF YOU ARE HAVING SURGERY AND GOING HOME THE SAME DAY, YOU MUST HAVE AN ADULT TO DRIVE YOU HOME AND BE WITH YOU FOR 24 HOURS. YOU MAY GO HOME BY TAXI OR UBER OR ORTHERWISE, BUT AN ADULT MUST ACCOMPANY YOU HOME AND STAY WITH YOU FOR 24 HOURS.  Name and phone number of your driver:  Special Instructions: N/A              Please read over the following fact sheets you were given: _____________________________________________________________________          [facility] - Preparing for Surgery Before surgery, you can play an important role.  Because skin is not sterile, your skin needs to be as free of germs as possible.  You can reduce the number of germs on your skin by washing with CHG (chlorahexidine gluconate) soap before surgery.  CHG is an  antiseptic cleaner which kills germs and bonds with the skin to continue killing germs even after washing. Please DO NOT use if you have an allergy  to CHG or antibacterial soaps.  If your skin becomes reddened/irritated stop using the CHG and inform your nurse when you arrive at Short Stay. Do not shave (including legs and underarms) for at least 48 hours prior to the first CHG shower.  You may shave your face/neck. Please follow these instructions carefully:  1.  Shower with CHG Soap the night before surgery and the  morning of Surgery.  2.  If you choose to wash your hair, wash your hair first as usual with your  normal  shampoo.  3.  After you shampoo, rinse your hair and body thoroughly to remove the  shampoo.                           4.  Use CHG as you would any other liquid soap.  You can apply chg directly  to the skin and wash                       Gently with a scrungie or clean washcloth.  5.  Apply the CHG Soap to your body ONLY FROM THE NECK DOWN.   Do not use on face/ open                           Wound or open sores. Avoid contact with eyes, ears mouth and genitals (private parts).                       Wash face,  Genitals (private parts) with your normal soap.             6.  Wash thoroughly, paying special attention to the area where your surgery  will be performed.  7.  Thoroughly rinse your body with warm water from the neck down.  8.  DO NOT shower/wash with your normal soap after using and rinsing off  the CHG Soap.                9.  Pat yourself dry with a clean towel.            10.  Wear clean pajamas.            11.  Place clean sheets on your bed the night of your first shower and do not  sleep with pets. Day of Surgery : Do not apply any lotions/deodorants the morning of surgery.  Please wear clean clothes to the hospital/surgery center.  FAILURE TO FOLLOW THESE INSTRUCTIONS MAY RESULT IN THE CANCELLATION OF YOUR SURGERY PATIENT SIGNATURE_________________________________  NURSE SIGNATURE__________________________________  ________________________________________________________________________

## 2021-02-02 ENCOUNTER — Other Ambulatory Visit: Payer: Self-pay

## 2021-02-02 ENCOUNTER — Encounter (HOSPITAL_COMMUNITY): Payer: Self-pay

## 2021-02-02 ENCOUNTER — Encounter (HOSPITAL_COMMUNITY)
Admission: RE | Admit: 2021-02-02 | Discharge: 2021-02-02 | Disposition: A | Discharge status: Home / Self Care | Payer: Medicare HMO | Source: Ambulatory Visit | Attending: Surgery | Admitting: Surgery

## 2021-02-02 DIAGNOSIS — Z01812 Encounter for preprocedural laboratory examination: Secondary | ICD-10-CM | POA: Insufficient documentation

## 2021-02-02 HISTORY — DX: Dyspnea, unspecified: R06.00

## 2021-02-02 LAB — CBC
HCT: 44 % (ref 36.0–46.0)
Hemoglobin: 14.4 g/dL (ref 12.0–15.0)
MCH: 28.7 pg (ref 26.0–34.0)
MCHC: 32.7 g/dL (ref 30.0–36.0)
MCV: 87.8 fL (ref 80.0–100.0)
Platelets: 178 10*3/uL (ref 150–400)
RBC: 5.01 MIL/uL (ref 3.87–5.11)
RDW: 12.9 % (ref 11.5–15.5)
WBC: 5.2 10*3/uL (ref 4.0–10.5)
nRBC: 0 % (ref 0.0–0.2)

## 2021-02-02 NOTE — Progress Notes (Signed)
COVID Vaccine Completed: Yes Date COVID Vaccine completed: 12/01/19 COVID vaccine manufacturer: Pfizer    PCP - Dr. Cathlean Cower. LOV: 11/17/20 Cardiologist -   Chest x-ray -  EKG -  Stress Test -  ECHO - 03/06/20 Cardiac Cath -  Pacemaker/ICD device last checked:  Sleep Study -  CPAP -   Fasting Blood Sugar -  Checks Blood Sugar _____ times a day  Blood Thinner Instructions: Aspirin Instructions: Last Dose:  Anesthesia review:   Patient denies shortness of breath, fever, cough and chest pain at PAT appointment   Patient verbalized understanding of instructions that were given to them at the PAT appointment. Patient was also instructed that they will need to review over the PAT instructions again at home before surgery.

## 2021-02-06 ENCOUNTER — Other Ambulatory Visit: Payer: Self-pay

## 2021-02-06 ENCOUNTER — Other Ambulatory Visit (HOSPITAL_COMMUNITY): Payer: Medicare HMO

## 2021-02-07 ENCOUNTER — Other Ambulatory Visit: Payer: Self-pay

## 2021-02-07 ENCOUNTER — Ambulatory Visit (INDEPENDENT_AMBULATORY_CARE_PROVIDER_SITE_OTHER): Payer: Medicare HMO | Admitting: Internal Medicine

## 2021-02-07 ENCOUNTER — Encounter: Payer: Self-pay | Admitting: Internal Medicine

## 2021-02-07 VITALS — BP 112/70 | HR 61 | Temp 98.0°F | Ht 65.0 in | Wt 149.0 lb

## 2021-02-07 DIAGNOSIS — R69 Illness, unspecified: Secondary | ICD-10-CM | POA: Diagnosis not present

## 2021-02-07 DIAGNOSIS — F411 Generalized anxiety disorder: Secondary | ICD-10-CM | POA: Diagnosis not present

## 2021-02-07 DIAGNOSIS — E210 Primary hyperparathyroidism: Secondary | ICD-10-CM

## 2021-02-07 DIAGNOSIS — E559 Vitamin D deficiency, unspecified: Secondary | ICD-10-CM

## 2021-02-07 NOTE — Progress Notes (Signed)
Patient ID: Lucienne Sawyers, female   DOB: 05-01-1941, 80 y.o.   MRN: [id number]        Chief Complaint: post surgury plan of care       HPI:  Alizandra Loh is a 80 y.o. female here wih mention of thinking she felt she needed more time and food prior to d/c home at about 3 hours after the surgury, and states surgeon was not accomodating so surgury cancelled. Pt denies chest pain, increased sob or doe, wheezing, orthopnea, PND, increased LE swelling, palpitations, dizziness or syncope.   Pt denies polydipsia, polyuria, or new focal neuro s/s.    Pt denies fever, wt loss, night sweats, loss of appetite, or other constitutional symptoms  Denies muscle cramping.  Denies worsening depressive symptoms, suicidal ideation, or panic; has ongoing anxiety,       Wt Readings from Last 3 Encounters:  02/07/21 149 lb (67.6 kg)  02/02/21 146 lb (66.2 kg)  11/17/20 150 lb (68 kg)   BP Readings from Last 3 Encounters:  02/07/21 112/70  02/02/21 134/84  11/17/20 116/68         Past Medical History:  Diagnosis Date  . ABNORMAL ELECTROCARDIOGRAM 07/19/2007  . ALLERGIC RHINITIS 07/19/2007  . Anatomical narrow angle glaucoma 04/20/2009  . ANXIETY 07/19/2007  . BACK PAIN 11/08/2009  . Blepharospasm 06/06/2008  . COLONIC POLYPS, HX OF 07/19/2007  . Cough 09/22/2007  . DEPRESSION 07/19/2007  . DIVERTICULOSIS, COLON 07/19/2007  . Dyspnea    exertion  . FATIGUE 04/20/2009  . GERD (gastroesophageal reflux disease)   . HYPERLIPIDEMIA 07/19/2007  . Left sided sciatica 01/22/2011  . LOW BACK PAIN 07/19/2007  . OSTEOARTHRITIS 07/19/2007  . OSTEOPOROSIS 07/19/2007  . PERIPHERAL NEUROPATHY 06/08/2007  . Ptosis of eyelid, bilateral   . RHINITIS 02/27/2009  . SINUSITIS- ACUTE-NOS 11/04/2007  . SORE THROAT 02/27/2009   Past Surgical History:  Procedure Laterality Date  . BREAST BIOPSY    . CATARACT EXTRACTION, BILATERAL    . COLONOSCOPY    . EYE SURGERY     cataract  . hammer toe surgury  2003   bilateral  . PTOSIS REPAIR  Bilateral 10/12/2018   Procedure: Bilateral ptosis eyelid correction external approach suture technique;  Surgeon: Irene Limbo, MD;  Location: [place] [facility];  Service: Plastics;  Laterality: Bilateral;  . TONSILLECTOMY      reports that she has quit smoking. She has never used smokeless tobacco. She reports current alcohol use. She reports that she does not use drugs. family history includes Colon cancer in her maternal uncle and mother. No Known Allergies Current Outpatient Medications on File Prior to Visit  Medication Sig Dispense Refill  . hydrochlorothiazide (MICROZIDE) 12.5 MG capsule Take 1 capsule (12.5 mg total) by mouth daily. 30 capsule 11  . ketoconazole (NIZORAL) 2 % shampoo Apply 1 application topically once a week.    . Magnesium 250 MG TABS Take 250 mg by mouth 3 (three) times a week.    . meloxicam (MOBIC) 15 MG tablet TAKE ONE TABLET BY MOUTH DAILY (Patient taking differently: Take 15 mg by mouth daily.) 90 tablet 3  . MINOXIDIL, TOPICAL, 5 % SOLN Apply 1 application topically daily. Hair    . pantoprazole (PROTONIX) 40 MG tablet Take 1 tablet (40 mg total) by mouth 2 (two) times daily. (Patient taking differently: Take 40 mg by mouth daily.) 180 tablet 3  . potassium gluconate 595 (99 K) MG TABS tablet Take 595 mg by mouth 3 (three)  times a week.    . Probiotic Product (ALIGN) 4 MG CAPS Take 4 mg by mouth See admin instructions. Every other month    . tizanidine (ZANAFLEX) 2 MG capsule TAKE ONE CAPSULE BY MOUTH THREE TIMES A DAY 40 capsule 2  . triamcinolone (NASACORT AQ) 55 MCG/ACT AERO nasal inhaler Place 2 sprays into the nose daily. (Patient taking differently: Place 2 sprays into the nose daily as needed (congestion).) 1 Inhaler 12  . Vitamin D3 (VITAMIN D) 25 MCG tablet Take 1,000 Units by mouth daily.     No current facility-administered medications on file prior to visit.        ROS:  All others reviewed and negative.  Objective        PE:  BP  112/70 (BP Location: Left Arm, Patient Position: Sitting, Cuff Size: Normal)   Pulse 61   Temp 98 F (36.7 C) (Oral)   Ht 5\' 5"  (1.651 m)   Wt 149 lb (67.6 kg)   SpO2 99%   BMI 24.79 kg/m                 Constitutional: Pt appears in NAD               HENT: Head: NCAT.                Right Ear: External ear normal.                 Left Ear: External ear normal.                Eyes: . Pupils are equal, round, and reactive to light. Conjunctivae and EOM are normal               Nose: without d/c or deformity               Neck: Neck supple. Gross normal ROM               Cardiovascular: Normal rate and regular rhythm.                 Pulmonary/Chest: Effort normal and breath sounds without rales or wheezing.                Abd:  Soft, NT, ND, + BS, no organomegaly               Neurological: Pt is alert. At baseline orientation, motor grossly intact               Skin: Skin is warm. No rashes, no other new lesions, LE edema - none               Psychiatric: Pt behavior is normal without agitation   Micro: none  Cardiac tracings I have personally interpreted today:  none  Pertinent Radiological findings (summarize): none   Lab Results  Component Value Date   WBC 5.2 02/02/2021   HGB 14.4 02/02/2021   HCT 44.0 02/02/2021   PLT 178 02/02/2021   GLUCOSE 79 11/17/2020   CHOL 215 (H) 11/17/2020   TRIG 109.0 11/17/2020   HDL 81.60 11/17/2020   LDLDIRECT 134.0 10/20/2013   LDLCALC 112 (H) 11/17/2020   ALT 16 11/17/2020   AST 18 11/17/2020   NA 139 11/17/2020   K 4.4 11/17/2020   CL 103 11/17/2020   CREATININE 0.82 11/17/2020   BUN 27 (H) 11/17/2020   CO2 31 11/17/2020   TSH 1.16 11/17/2020   Assessment/Plan:  Keeleigh Terris is a 80 y.o. White or Caucasian [1] female with  has a past medical history of ABNORMAL ELECTROCARDIOGRAM (07/19/2007), ALLERGIC RHINITIS (07/19/2007), Anatomical narrow angle glaucoma (04/20/2009), ANXIETY (07/19/2007), BACK PAIN (11/08/2009), Blepharospasm  (06/06/2008), COLONIC POLYPS, HX OF (07/19/2007), Cough (09/22/2007), DEPRESSION (07/19/2007), DIVERTICULOSIS, COLON (07/19/2007), Dyspnea, FATIGUE (04/20/2009), GERD (gastroesophageal reflux disease), HYPERLIPIDEMIA (07/19/2007), Left sided sciatica (01/22/2011), LOW BACK PAIN (07/19/2007), OSTEOARTHRITIS (07/19/2007), OSTEOPOROSIS (07/19/2007), PERIPHERAL NEUROPATHY (06/08/2007), Ptosis of eyelid, bilateral, RHINITIS (02/27/2009), SINUSITIS- ACUTE-NOS (11/04/2007), and SORE THROAT (02/27/2009).  Hyperparathyroidism, primary ([place]) Pt disagrees with local general surgury post op plan of care; surgury has been cancelled, ok for referral to general surgury [facility] for second opinion  Hypercalcemia Mild persistent, secondary,  to f/u any worsening symptoms or concerns  Anxiety state Declines need for change in tx,  to f/u any worsening symptoms or concerns  Vitamin D insufficiency Last vitamin D Lab Results  Component Value Date   VD25OH 47.08 11/17/2020   Stable, cont oral replacement  Followup: Return if symptoms worsen or fail to improve.  Cathlean Cower, MD 02/11/2021 7:48 PM [facility]

## 2021-02-09 ENCOUNTER — Ambulatory Visit (HOSPITAL_COMMUNITY): Admission: RE | Admit: 2021-02-09 | Payer: Medicare HMO | Source: Home / Self Care | Admitting: Surgery

## 2021-02-09 ENCOUNTER — Encounter (HOSPITAL_COMMUNITY): Admission: RE | Payer: Self-pay | Source: Home / Self Care

## 2021-02-09 DIAGNOSIS — E210 Primary hyperparathyroidism: Secondary | ICD-10-CM

## 2021-02-09 DIAGNOSIS — M810 Age-related osteoporosis without current pathological fracture: Secondary | ICD-10-CM | POA: Diagnosis present

## 2021-02-09 SURGERY — PARATHYROIDECTOMY
Anesthesia: General | Laterality: Left

## 2021-02-11 ENCOUNTER — Encounter: Payer: Self-pay | Admitting: Internal Medicine

## 2021-02-11 NOTE — Patient Instructions (Signed)
You will be contacted regarding the referral for: general surgury at [facility]

## 2021-02-11 NOTE — Assessment & Plan Note (Signed)
Mild persistent, secondary,  to f/u any worsening symptoms or concerns

## 2021-02-11 NOTE — Assessment & Plan Note (Signed)
Pt disagrees with local general surgury post op plan of care; surgury has been cancelled, ok for referral to general surgury [facility] for second opinion

## 2021-02-11 NOTE — Assessment & Plan Note (Signed)
Last vitamin D Lab Results  Component Value Date   VD25OH 47.08 11/17/2020   Stable, cont oral replacement

## 2021-02-11 NOTE — Assessment & Plan Note (Signed)
Declines need for change in tx,  to f/u any worsening symptoms or concerns

## 2021-04-02 DIAGNOSIS — E213 Hyperparathyroidism, unspecified: Secondary | ICD-10-CM | POA: Diagnosis not present

## 2021-04-02 DIAGNOSIS — E210 Primary hyperparathyroidism: Secondary | ICD-10-CM | POA: Diagnosis not present

## 2021-04-17 ENCOUNTER — Telehealth: Payer: Self-pay | Admitting: *Deleted

## 2021-04-17 NOTE — Chronic Care Management (AMB) (Signed)
  Chronic Care Management   Note  04/17/2021 Name: Robyn Bailey MRN: [id number] DOB: [birth year]  [name] is a 80 y.o. year old female who is a primary care patient of Biagio Borg, MD. I reached out to [name] by phone today in response to a referral sent by [name]'s PCP, Dr. Jenny Reichmann      [name] was given information about Chronic Care Management services today including:  CCM service includes personalized support from designated clinical staff supervised by her physician, including individualized plan of care and coordination with other care providers 24/7 contact phone numbers for assistance for urgent and routine care needs. Service will only be billed when office clinical staff spend 20 minutes or more in a month to coordinate care. Only one practitioner may furnish and bill the service in a calendar month. The patient may stop CCM services at any time (effective at the end of the month) by phone call to the office staff. The patient will be responsible for cost sharing (co-pay) of up to 20% of the service fee (after annual deductible is met).  Patient agreed to services and verbal consent obtained.   Follow up plan: Telephone appointment with care management team member scheduled for:05/08/21  [name]  Direct Dial: [phone]

## 2021-05-03 DIAGNOSIS — E213 Hyperparathyroidism, unspecified: Secondary | ICD-10-CM | POA: Diagnosis not present

## 2021-05-03 DIAGNOSIS — D351 Benign neoplasm of parathyroid gland: Secondary | ICD-10-CM | POA: Diagnosis not present

## 2021-05-04 DIAGNOSIS — D351 Benign neoplasm of parathyroid gland: Secondary | ICD-10-CM | POA: Diagnosis not present

## 2021-05-04 DIAGNOSIS — E213 Hyperparathyroidism, unspecified: Secondary | ICD-10-CM | POA: Diagnosis not present

## 2021-05-08 ENCOUNTER — Ambulatory Visit (INDEPENDENT_AMBULATORY_CARE_PROVIDER_SITE_OTHER): Payer: Medicare HMO | Admitting: *Deleted

## 2021-05-08 DIAGNOSIS — E559 Vitamin D deficiency, unspecified: Secondary | ICD-10-CM

## 2021-05-08 DIAGNOSIS — E7800 Pure hypercholesterolemia, unspecified: Secondary | ICD-10-CM

## 2021-05-08 NOTE — Patient Instructions (Addendum)
Visit Information   Robyn Bailey, it was nice talking with you today.   I look forward to talking to you again for an update on Wednesday, May 23, 2021 at 9:00 am- please be listening out for my call that day.  I will call as close to 9:00 am as possible.   If you need to cancel or re-schedule our telephone visit, please call [phone] and one of our care guides will be happy to assist you.   I look forward to hearing about your progress.   Please don't hesitate to contact me if I can be of assistance to you before our next scheduled telephone appointment.   Robyn Rack, RN, BSN, [facility] RN Care Coordination- [place] [phone]: direct office [phone]: mobile   PATIENT GOALS:   Goals Addressed             This Visit's Progress    Patient Self- Care Activities       Timeframe:  Long-Range Goal Priority:  Medium Start Date:      05/08/21                       Expected End Date:        05/08/22               Patient will self administer medications as prescribed Patient will attend all scheduled provider appointments Patient will call pharmacy for medication refills Patient will call provider office for new concerns or questions       Understanding Your Risk for Falls Each year, millions of people have serious injuries from falls. It is important to understand your risk for falling. Talk with your health care provider about your risk and what you can do to lower it. There are actions you can take athome to lower your risk and prevent falls. If you do have a serious fall, make sure to tell your health care provider.Falling once raises your risk of falling again. How can falls affect me? Serious injuries from falls are common. These include: Broken bones, such as hip fractures. Head injuries, such as traumatic brain injuries (TBI) or concussion. A fear of falling can cause you to avoid activities and stay at home. This canmake your  muscles weaker and actually raise your risk for a fall. What can increase my risk? There are a number of risk factors that increase your risk for falling. The more risk factors you have, the higher your risk of falling. Serious injuries from a fall happen most often to people older than age 47. Children and youngadults ages 84-29 are also at higher risk. Common risk factors include: Weakness in the lower body. Lack (deficiency) of vitamin D. Being generally weak or confused due to long-term (chronic) illness. Dizziness or balance problems. Poor vision. Medicines that cause dizziness or drowsiness. These can include medicines for your blood pressure, heart, anxiety, insomnia, or edema, as well as pain medicines and muscle relaxants. Other risk factors include: Drinking alcohol. Having had a fall in the past. Having depression. Having foot pain or wearing improper footwear. Working at a dangerous job. Having any of the following in your home: Tripping hazards, such as floor clutter or loose rugs. Poor lighting. Pets. Having dementia or memory loss. What actions can I take to lower my risk of falling?     Physical activity Maintain physical fitness. Do strength and balance exercises. Consider taking aregular class to build strength  and balance. Yoga and tai chi are good options. Vision Have your eyes checked every year and your vision prescription updated asneeded. Walking aids and footwear Wear nonskid shoes. Do not wear high heels. Do not walk around the house in socks or slippers. Use a cane or walker as told by your health care provider. Home safety Attach secure railings on both sides of your stairs. Install grab bars for your tub, shower, and toilet. Use a bath mat in your tub or shower. Use good lighting in all rooms. Keep a flashlight near your bed. Make sure there is a clear path from your bed to the bathroom. Use night-lights. Do not use throw rugs. Make sure all  carpeting is taped or tacked down securely. Remove all clutter from walkways and stairways, including extension cords. Repair uneven or broken steps. Avoid walking on icy or slippery surfaces. Walk on the grass instead of on icy or slick sidewalks. Use ice melt to get rid of ice on walkways. Use a cordless phone. Questions to ask your health care provider Can you help me check my risk for a fall? Do any of my medicines make me more likely to fall? Should I take a vitamin D supplement? What exercises can I do to improve my strength and balance? Should I make an appointment to have my vision checked? Do I need a bone density test to check for weak bones or osteoporosis? Would it help to use a cane or a walker? Where to find more information Centers for Disease Control and Prevention, STEADI: http://www.wolf.info/ Community-Based Fall Prevention Programs: http://www.wolf.info/ National Institute on Aging: http://kim-miller.com/ Contact a health care provider if: You fall at home. You are afraid of falling at home. You feel weak, drowsy, or dizzy. Summary Serious injuries from a fall happen most often to people older than age 72. Children and young adults ages 50-29 are also at higher risk. Talk with your health care provider about your risks for falling and how to lower those risks. Taking certain precautions at home can lower your risk for falling. If you fall, always tell your health care provider. This information is not intended to replace advice given to you by your health care provider. Make sure you discuss any questions you have with your healthcare provider. Document Revised: 04/05/2020 Document Reviewed: 04/05/2020 Elsevier Patient Education  2022 [employer].  Consent to CCM Services: Robyn Bailey was given information about Chronic Care Management services including:  CCM service includes personalized support from designated clinical staff supervised by her physician, including individualized plan of  care and coordination with other care providers 24/7 contact phone numbers for assistance for urgent and routine care needs. Service will only be billed when office clinical staff spend 20 minutes or more in a month to coordinate care. Only one practitioner may furnish and bill the service in a calendar month. The patient may stop CCM services at any time (effective at the end of the month) by phone call to the office staff. The patient will be responsible for cost sharing (co-pay) of up to 20% of the service fee (after annual deductible is met).  Patient agreed to services and verbal consent obtained.   Patient verbalizes understanding of instructions provided today and agrees to view in [place].  Telephone follow up appointment with care management team member scheduled for:  Wednesday, May 23, 2021 at 9:00 am The patient has been provided with contact information for the care management team and has been advised to call  with any health related questions or concerns  Robyn Rack, RN, BSN, [place] [phone]: direct office [phone]: mobile    CLINICAL CARE PLAN: Patient Care Plan: RN Care Manager Plan of Care     Problem Identified: Chronic Disease Management Needs   Priority: Medium     Long-Range Goal: Development of plan of care for long term chronic disease management   Start Date: 05/08/2021  Expected End Date: 05/08/2022  Priority: Medium  Note:   Current Barriers:  Chronic Disease Management support and education needs related to HLD and OA/ Vitamin D deficiency  RNCM Clinical Goal(s):  Patient will demonstrate ongoing health management independence for HLD/ OA/ Vitamin D deficiency  through collaboration with RN Care manager, provider, and care team.   Interventions: 1:1 collaboration with primary care provider regarding development and update of comprehensive plan of care as evidenced by  provider attestation and co-signature Inter-disciplinary care team collaboration (see longitudinal plan of care) Evaluation of current treatment plan related to  self management and patient's adherence to plan as established by provider  Health Maintenance (Status: New goal.)  Provided education about fall risks, action to take for clinical concerns post- acute mechanical fall this morning Contacted patient for previously scheduled CCM RN CM initial assessment: patient immediately reports that she is lying down after having incurred a mechanical fall this morning Reports post-op from recent parathyroidectomy on 05/03/21: states she went out to take a walk this morning for exercise and turned to change directions, tripped on curb, fell and hit head: reports gash to her eye from sunglasses; currently using ice packs; able to move all extremities, does not think she needs stitches; states she/ neighbors do not believe she has any broken bones; reports "sore"  Confirmed no light-headedness/ syncope prior to fall: she reports, "just lost my balance and fell over curb" Confirmed she has neighbors currently with her- she provides consent for me to speak with neighbors while phone on speaker mode: "Robyn Bailey," and "Robyn Bailey" Confirmed patient normally lives alone with no one else in home: neighbors are in agreement to monitor patient's condition several times throughout day throughout next couple of days Discussed signs/ symptoms with patient and her neighbors that would necessitate seeking urgent/ emergent care: ongoing new headache, confusion, change in mental status: coached patient and neighbors on orientation assessment to perform with frequent ongoing assessments, including this evening prior to all going to bed Confirmed patient has scheduled post-op appointment with surgical provider 05/18/21-- she has not yet been released to drive: advised not to drive and to follow post-op instructions as per  surgical provider Confirmed patient has no other concerns except acute mechanical fall today Provided patient and her neighbors with my direct contact information, should non-urgent clinical issues arise as a result of fall today Confirmed patient is not on ACT therapy post-op and has no medication concerns Patient requests re-scheduling of initial call today, as she is tired/ upset/ sore after fall this morning, and we completed re-scheduling together, according to patient preference, after her follow up appointment with surgical provider  Patient Goals/Self-Care Activities: Patient will self administer medications as prescribed Patient will attend all scheduled provider appointments Patient will call pharmacy for medication refills Patient will call provider office for new concerns or questions

## 2021-05-08 NOTE — Chronic Care Management (AMB) (Signed)
Chronic Care Management   CCM RN Visit Note  05/08/2021 Name: Robyn Bailey MRN: [id number] DOB: 24-May-1941  Subjective: Robyn Bailey is a 80 y.o. year old female who is a primary care patient of Biagio Borg, MD. The care management team was consulted for assistance with disease management and care coordination needs.    Engaged with patient by telephone for initial visit in response to provider referral for case management and/or care coordination services.   Consent to Services:  The patient was given information about Chronic Care Management services, agreed to services, and gave verbal consent 04/17/21 prior to initiation of services.  Please see initial visit note for detailed documentation.  Patient agreed to services and verbal consent obtained.   Assessment: Review of patient past medical history, allergies, medications, health status, including review of consultants reports, laboratory and other test data, was performed as part of comprehensive evaluation and provision of chronic care management services.   SDOH (Social Determinants of Health) assessments and interventions performed:  SDOH Interventions    Flowsheet Row Most Recent Value  SDOH Interventions   Food Insecurity Interventions Intervention Not Indicated  Transportation Interventions Intervention Not Indicated  [Patient drives self]      CCM Care Plan No Known Allergies  Outpatient Encounter Medications as of 05/08/2021  Medication Sig   hydrochlorothiazide (MICROZIDE) 12.5 MG capsule Take 1 capsule (12.5 mg total) by mouth daily.   ketoconazole (NIZORAL) 2 % shampoo Apply 1 application topically once a week.   Magnesium 250 MG TABS Take 250 mg by mouth 3 (three) times a week.   meloxicam (MOBIC) 15 MG tablet TAKE ONE TABLET BY MOUTH DAILY (Patient taking differently: Take 15 mg by mouth daily.)   MINOXIDIL, TOPICAL, 5 % SOLN Apply 1 application topically daily. Hair   pantoprazole (PROTONIX) 40 MG tablet  Take 1 tablet (40 mg total) by mouth 2 (two) times daily. (Patient taking differently: Take 40 mg by mouth daily.)   potassium gluconate 595 (99 K) MG TABS tablet Take 595 mg by mouth 3 (three) times a week.   Probiotic Product (ALIGN) 4 MG CAPS Take 4 mg by mouth See admin instructions. Every other month   tizanidine (ZANAFLEX) 2 MG capsule TAKE ONE CAPSULE BY MOUTH THREE TIMES A DAY   triamcinolone (NASACORT AQ) 55 MCG/ACT AERO nasal inhaler Place 2 sprays into the nose daily. (Patient taking differently: Place 2 sprays into the nose daily as needed (congestion).)   Vitamin D3 (VITAMIN D) 25 MCG tablet Take 1,000 Units by mouth daily.   No facility-administered encounter medications on file as of 05/08/2021.   Patient Active Problem List   Diagnosis Date Noted   Right sided sciatica 09/11/2020   Muscle cramps 08/29/2020   Mild peripheral edema 02/29/2020   Piriformis syndrome of right side 12/13/2019   Leg pain, lateral, right 11/17/2019   Hyperparathyroidism, primary ([place]) 12/21/2018   Hypercalcemia 12/21/2018   Vitamin D insufficiency 12/21/2018   Age-related osteoporosis without current pathological fracture 12/21/2018   Ptosis of eyelid, bilateral 10/12/2018   Ptosis 02/12/2018   Palpitations 11/12/2017   Radiculitis of right cervical region 11/11/2017   Eustachian tube dysfunction 11/08/2016   Tinnitus of left ear 09/15/2015   Insect stings 04/22/2013   Rash and nonspecific skin eruption 04/22/2013   GERD (gastroesophageal reflux disease) 09/28/2012   Left lumbar radiculopathy 02/01/2011   Left sided sciatica 01/22/2011   Preventative health care 01/21/2011   Acute angle-closure glaucoma 04/20/2009   FATIGUE 04/20/2009  Blepharospasm 06/06/2008   HLD (hyperlipidemia) 07/19/2007   Anxiety state 07/19/2007   Depression 07/19/2007   Allergic rhinitis 07/19/2007   Diverticulosis of colon 07/19/2007   OSTEOARTHRITIS 07/19/2007   LOW BACK PAIN 07/19/2007   Osteoporosis  07/19/2007   ABNORMAL ELECTROCARDIOGRAM 07/19/2007   MACULAR DEGENERATION, HX OF 07/19/2007   COLONIC POLYPS, HX OF 07/19/2007   PERIPHERAL NEUROPATHY 06/08/2007   Conditions to be addressed/monitored:  HLD and OA/ Vitamin D deficiency  Care Plan : [place] of Care  Updates made by Knox Royalty, RN since 05/08/2021 12:00 AM     Problem: Chronic Disease Management Needs   Priority: Medium     Long-Range Goal: Development of plan of care for long term chronic disease management   Start Date: 05/08/2021  Expected End Date: 05/08/2022  Priority: Medium  Note:   Current Barriers:  Chronic Disease Management support and education needs related to HLD and OA/ Vitamin D deficiency  RNCM Clinical Goal(s):  Patient will demonstrate ongoing health management independence for HLD/ OA/ Vitamin D deficiency  through collaboration with RN Care manager, provider, and care team.   Interventions: 1:1 collaboration with primary care provider regarding development and update of comprehensive plan of care as evidenced by provider attestation and co-signature Inter-disciplinary care team collaboration (see longitudinal plan of care) Evaluation of current treatment plan related to  self management and patient's adherence to plan as established by provider  Health Maintenance (Status: New goal.)  Provided education about fall risks, action to take for clinical concerns post- acute mechanical fall this morning Contacted patient for previously scheduled CCM RN CM initial assessment: patient immediately reports that she is lying down after having incurred a mechanical fall this morning Reports post-op from recent parathyroidectomy on 05/03/21: states she went out to take a walk this morning for exercise and turned to change directions, tripped on curb, fell and hit head: reports gash to her eye from sunglasses; currently using ice packs; able to move all extremities, does not think she needs  stitches; states she/ neighbors do not believe she has any broken bones; reports "sore"  Confirmed no light-headedness/ syncope prior to fall: she reports, "just lost my balance and fell over curb" Confirmed she has neighbors currently with her- she provides consent for me to speak with neighbors while phone on speaker mode: "Dianne Cashion," and "Tyler Deis" Confirmed patient normally lives alone with no one else in home: neighbors are in agreement to monitor patient's condition several times throughout day throughout next couple of days Discussed signs/ symptoms with patient and her neighbors that would necessitate seeking urgent/ emergent care: ongoing new headache, confusion, change in mental status: coached patient and neighbors on orientation assessment to perform with frequent ongoing assessments, including this evening prior to all going to bed Confirmed patient has scheduled post-op appointment with surgical provider 05/18/21-- she has not yet been released to drive: advised not to drive and to follow post-op instructions as per surgical provider Confirmed patient has no other concerns except acute mechanical fall today Provided patient and her neighbors with my direct contact information, should non-urgent clinical issues arise as a result of fall today Confirmed patient is not on ACT therapy post-op and has no medication concerns Patient requests re-scheduling of initial call today, as she is tired/ upset/ sore after fall this morning, and we completed re-scheduling together, according to patient preference, after her follow up appointment with surgical provider  Patient Goals/Self-Care Activities: Patient will self administer  medications as prescribed Patient will attend all scheduled provider appointments Patient will call pharmacy for medication refills Patient will call provider office for new concerns or questions      Plan: Telephone follow up appointment with care management  team member scheduled for:  Wednesday, May 23, 2021 at 9:00 am The patient has been provided with contact information for the care management team and has been advised to call with any health related questions or concerns  [name], RN, BSN, [place] [phone]: direct office [phone]: mobile

## 2021-05-09 ENCOUNTER — Ambulatory Visit: Payer: Medicare HMO | Admitting: *Deleted

## 2021-05-09 DIAGNOSIS — E785 Hyperlipidemia, unspecified: Secondary | ICD-10-CM

## 2021-05-09 DIAGNOSIS — E210 Primary hyperparathyroidism: Secondary | ICD-10-CM

## 2021-05-09 DIAGNOSIS — E559 Vitamin D deficiency, unspecified: Secondary | ICD-10-CM

## 2021-05-09 NOTE — Chronic Care Management (AMB) (Signed)
Chronic Care Management   CCM RN Visit Note  05/09/2021 Name: Robyn Bailey MRN: [id number] DOB: 04-08-1941  Subjective: Robyn Bailey is a 80 y.o. year old female who is a primary care patient of Biagio Borg, MD. The care management team was consulted for assistance with disease management and care coordination needs.    Engaged with patient by telephone for  acute call  in response to provider referral for case management and/or care coordination services.   Consent to Services:  The patient was given information about Chronic Care Management services, agreed to services, and gave verbal consent prior to initiation of services.  Please see initial visit note for detailed documentation.  Patient agreed to services and verbal consent obtained.   Assessment: Review of patient past medical history, allergies, medications, health status, including review of consultants reports, laboratory and other test data, was performed as part of comprehensive evaluation and provision of chronic care management services.    CCM Care Plan No Known Allergies  Outpatient Encounter Medications as of 05/09/2021  Medication Sig   hydrochlorothiazide (MICROZIDE) 12.5 MG capsule Take 1 capsule (12.5 mg total) by mouth daily.   ketoconazole (NIZORAL) 2 % shampoo Apply 1 application topically once a week.   Magnesium 250 MG TABS Take 250 mg by mouth 3 (three) times a week.   meloxicam (MOBIC) 15 MG tablet TAKE ONE TABLET BY MOUTH DAILY (Patient taking differently: Take 15 mg by mouth daily.)   MINOXIDIL, TOPICAL, 5 % SOLN Apply 1 application topically daily. Hair   pantoprazole (PROTONIX) 40 MG tablet Take 1 tablet (40 mg total) by mouth 2 (two) times daily. (Patient taking differently: Take 40 mg by mouth daily.)   potassium gluconate 595 (99 K) MG TABS tablet Take 595 mg by mouth 3 (three) times a week.   Probiotic Product (ALIGN) 4 MG CAPS Take 4 mg by mouth See admin instructions. Every other month    tizanidine (ZANAFLEX) 2 MG capsule TAKE ONE CAPSULE BY MOUTH THREE TIMES A DAY   triamcinolone (NASACORT AQ) 55 MCG/ACT AERO nasal inhaler Place 2 sprays into the nose daily. (Patient taking differently: Place 2 sprays into the nose daily as needed (congestion).)   Vitamin D3 (VITAMIN D) 25 MCG tablet Take 1,000 Units by mouth daily.   No facility-administered encounter medications on file as of 05/09/2021.   Patient Active Problem List   Diagnosis Date Noted   Right sided sciatica 09/11/2020   Muscle cramps 08/29/2020   Mild peripheral edema 02/29/2020   Piriformis syndrome of right side 12/13/2019   Leg pain, lateral, right 11/17/2019   Hyperparathyroidism, primary ([place]) 12/21/2018   Hypercalcemia 12/21/2018   Vitamin D insufficiency 12/21/2018   Age-related osteoporosis without current pathological fracture 12/21/2018   Ptosis of eyelid, bilateral 10/12/2018   Ptosis 02/12/2018   Palpitations 11/12/2017   Radiculitis of right cervical region 11/11/2017   Eustachian tube dysfunction 11/08/2016   Tinnitus of left ear 09/15/2015   Insect stings 04/22/2013   Rash and nonspecific skin eruption 04/22/2013   GERD (gastroesophageal reflux disease) 09/28/2012   Left lumbar radiculopathy 02/01/2011   Left sided sciatica 01/22/2011   Preventative health care 01/21/2011   Acute angle-closure glaucoma 04/20/2009   FATIGUE 04/20/2009   Blepharospasm 06/06/2008   HLD (hyperlipidemia) 07/19/2007   Anxiety state 07/19/2007   Depression 07/19/2007   Allergic rhinitis 07/19/2007   Diverticulosis of colon 07/19/2007   OSTEOARTHRITIS 07/19/2007   LOW BACK PAIN 07/19/2007   Osteoporosis 07/19/2007  ABNORMAL ELECTROCARDIOGRAM 07/19/2007   MACULAR DEGENERATION, HX OF 07/19/2007   COLONIC POLYPS, HX OF 07/19/2007   PERIPHERAL NEUROPATHY 06/08/2007   Conditions to be addressed/monitored:  HLD and OA/ Vitamin D deficiency  Care Plan : RN Care Manager Plan of Care  Updates made by Knox Royalty, RN since 05/09/2021 12:00 AM     Problem: Chronic Disease Management Needs   Priority: Medium     Long-Range Goal: Development of plan of care for long term chronic disease management   Start Date: 05/08/2021  Expected End Date: 05/08/2022  Priority: Medium  Note:   Current Barriers:  Chronic Disease Management support and education needs related to HLD and OA/ Vitamin D deficiency  RNCM Clinical Goal(s):  Patient will demonstrate ongoing health management independence for HLD/ OA/ Vitamin D deficiency  through collaboration with RN Care manager, provider, and care team.   Interventions: 1:1 collaboration with primary care provider regarding development and update of comprehensive plan of care as evidenced by provider attestation and co-signature Inter-disciplinary care team collaboration (see longitudinal plan of care) Evaluation of current treatment plan related to  self management and patient's adherence to plan as established by provider  05/09/21: Acute call from patient: Patient confirms she is doing well after her mechanical fall yesterday: "just a little sore;" reports calling today due to post-op constipation; has not had BM x 4 days; states she contacted her surgeon's office to advise and was told to try OTC enema, as prescribed miralax has not been effective.  Discussed with patient various OTC laxative remedies she may want to try, as she is reticent to try enema.  Advised patient to go to outpatient pharmacy and discuss specific options with pharmacist.  Encouraged her to maintain contact with surgeon for post-op  advice. She is agreeable and will do.  No other needs/ concerns identified today.  Health Maintenance (Status: New goal.)  Provided education about fall risks, action to take for clinical concerns post- acute mechanical fall this morning Contacted patient for previously scheduled CCM RN CM initial assessment: patient immediately reports that she is lying down  after having incurred a mechanical fall this morning Reports post-op from recent parathyroidectomy on 05/03/21: states she went out to take a walk this morning for exercise and turned to change directions, tripped on curb, fell and hit head: reports gash to her eye from sunglasses; currently using ice packs; able to move all extremities, does not think she needs stitches; states she/ neighbors do not believe she has any broken bones; reports "sore"  Confirmed no light-headedness/ syncope prior to fall: she reports, "just lost my balance and fell over curb" Confirmed she has neighbors currently with her- she provides consent for me to speak with neighbors while phone on speaker mode: "Dianne Cashion," and "Tyler Deis" Confirmed patient normally lives alone with no one else in home: neighbors are in agreement to monitor patient's condition several times throughout day throughout next couple of days Discussed signs/ symptoms with patient and her neighbors that would necessitate seeking urgent/ emergent care: ongoing new headache, confusion, change in mental status: coached patient and neighbors on orientation assessment to perform with frequent ongoing assessments, including this evening prior to all going to bed Confirmed patient has scheduled post-op appointment with surgical provider 05/18/21-- she has not yet been released to drive: advised not to drive and to follow post-op instructions as per surgical provider Confirmed patient has no other concerns except acute mechanical fall today Provided patient and  her neighbors with my direct contact information, should non-urgent clinical issues arise as a result of fall today Confirmed patient is not on ACT therapy post-op and has no medication concerns Patient requests re-scheduling of initial call today, as she is tired/ upset/ sore after fall this morning, and we completed re-scheduling together, according to patient preference, after her follow up  appointment with surgical provider  Patient Goals/Self-Care Activities: Patient will self administer medications as prescribed Patient will attend all scheduled provider appointments Patient will call pharmacy for medication refills Patient will call provider office for new concerns or questions      Plan: Telephone follow up appointment with care management team member scheduled for:  May 23, 2021 The patient has been provided with contact information for the care management team and has been advised to call with any health related questions or concerns   [name], RN, BSN, [place] [phone]: direct office [phone]: mobile

## 2021-05-09 NOTE — Patient Instructions (Signed)
Visit Information  PATIENT GOALS:  Goals Addressed             This Visit's Progress    Patient Self- Care Activities   On track    Timeframe:  Long-Range Goal Priority:  Medium Start Date:      05/08/21                       Expected End Date:        05/08/22               Patient will self administer medications as prescribed Patient will attend all scheduled provider appointments Patient will call pharmacy for medication refills Patient will call provider office for new concerns or questions       The patient verbalized understanding of instructions, educational materials, and care plan provided today and declined offer to receive copy of patient instructions, educational materials, and care plan.  Telephone follow up appointment with care management team member scheduled for: May 23, 2021 The patient has been provided with contact information for the care management team and has been advised to call with any health related questions or concerns.   [name], RN, BSN, [facility] RN Care Coordination- [place] [phone]: direct office [phone]: mobile

## 2021-05-10 ENCOUNTER — Ambulatory Visit: Payer: Medicare HMO | Admitting: *Deleted

## 2021-05-10 DIAGNOSIS — E785 Hyperlipidemia, unspecified: Secondary | ICD-10-CM

## 2021-05-10 DIAGNOSIS — E559 Vitamin D deficiency, unspecified: Secondary | ICD-10-CM

## 2021-05-10 NOTE — Chronic Care Management (AMB) (Signed)
Chronic Care Management   CCM RN Visit Note  05/10/2021 Name: Robyn Bailey MRN: [id number] DOB: 05-Mar-1941  Subjective: Robyn Bailey is a 80 y.o. year old female who is a primary care patient of Biagio Borg, MD. The care management team was consulted for assistance with disease management and care coordination needs.    Engaged with patient by telephone for  acute/ unexpected follow up call  in response to provider referral for case management and/or care coordination services.   Consent to Services:  The patient was given information about Chronic Care Management services, agreed to services, and gave verbal consent prior to initiation of services.  Please see initial visit note for detailed documentation.  Patient agreed to services and verbal consent obtained.   Assessment: Review of patient past medical history, allergies, medications, health status, including review of consultants reports, laboratory and other test data, was performed as part of comprehensive evaluation and provision of chronic care management services.   CCM Care Plan No Known Allergies  Outpatient Encounter Medications as of 05/10/2021  Medication Sig   hydrochlorothiazide (MICROZIDE) 12.5 MG capsule Take 1 capsule (12.5 mg total) by mouth daily.   ketoconazole (NIZORAL) 2 % shampoo Apply 1 application topically once a week.   Magnesium 250 MG TABS Take 250 mg by mouth 3 (three) times a week.   meloxicam (MOBIC) 15 MG tablet TAKE ONE TABLET BY MOUTH DAILY (Patient taking differently: Take 15 mg by mouth daily.)   MINOXIDIL, TOPICAL, 5 % SOLN Apply 1 application topically daily. Hair   pantoprazole (PROTONIX) 40 MG tablet Take 1 tablet (40 mg total) by mouth 2 (two) times daily. (Patient taking differently: Take 40 mg by mouth daily.)   potassium gluconate 595 (99 K) MG TABS tablet Take 595 mg by mouth 3 (three) times a week.   Probiotic Product (ALIGN) 4 MG CAPS Take 4 mg by mouth See admin instructions. Every  other month   tizanidine (ZANAFLEX) 2 MG capsule TAKE ONE CAPSULE BY MOUTH THREE TIMES A DAY   triamcinolone (NASACORT AQ) 55 MCG/ACT AERO nasal inhaler Place 2 sprays into the nose daily. (Patient taking differently: Place 2 sprays into the nose daily as needed (congestion).)   Vitamin D3 (VITAMIN D) 25 MCG tablet Take 1,000 Units by mouth daily.   No facility-administered encounter medications on file as of 05/10/2021.   Patient Active Problem List   Diagnosis Date Noted   Right sided sciatica 09/11/2020   Muscle cramps 08/29/2020   Mild peripheral edema 02/29/2020   Piriformis syndrome of right side 12/13/2019   Leg pain, lateral, right 11/17/2019   Hyperparathyroidism, primary ([place]) 12/21/2018   Hypercalcemia 12/21/2018   Vitamin D insufficiency 12/21/2018   Age-related osteoporosis without current pathological fracture 12/21/2018   Ptosis of eyelid, bilateral 10/12/2018   Ptosis 02/12/2018   Palpitations 11/12/2017   Radiculitis of right cervical region 11/11/2017   Eustachian tube dysfunction 11/08/2016   Tinnitus of left ear 09/15/2015   Insect stings 04/22/2013   Rash and nonspecific skin eruption 04/22/2013   GERD (gastroesophageal reflux disease) 09/28/2012   Left lumbar radiculopathy 02/01/2011   Left sided sciatica 01/22/2011   Preventative health care 01/21/2011   Acute angle-closure glaucoma 04/20/2009   FATIGUE 04/20/2009   Blepharospasm 06/06/2008   HLD (hyperlipidemia) 07/19/2007   Anxiety state 07/19/2007   Depression 07/19/2007   Allergic rhinitis 07/19/2007   Diverticulosis of colon 07/19/2007   OSTEOARTHRITIS 07/19/2007   LOW BACK PAIN 07/19/2007   Osteoporosis 07/19/2007  ABNORMAL ELECTROCARDIOGRAM 07/19/2007   MACULAR DEGENERATION, HX OF 07/19/2007   COLONIC POLYPS, HX OF 07/19/2007   PERIPHERAL NEUROPATHY 06/08/2007   Conditions to be addressed/monitored:  HLD and Vitamin D deficiency  Care Plan : RN Care Manager Plan of Care  Updates made by  Knox Royalty, RN since 05/10/2021 12:00 AM     Problem: Chronic Disease Management Needs   Priority: Medium     Long-Range Goal: Development of plan of care for long term chronic disease management   Start Date: 05/08/2021  Expected End Date: 05/08/2022  Priority: Medium  Note:   Current Barriers:  Chronic Disease Management support and education needs related to HLD and OA/ Vitamin D deficiency  RNCM Clinical Goal(s):  Patient will demonstrate ongoing health management independence for HLD/ OA/ Vitamin D deficiency  through collaboration with RN Care manager, provider, and care team.   Interventions: 1:1 collaboration with primary care provider regarding development and update of comprehensive plan of care as evidenced by provider attestation and co-signature Inter-disciplinary care team collaboration (see longitudinal plan of care) Evaluation of current treatment plan related to  self management and patient's adherence to plan as established by provider  05/10/21: Acute call from patient: Patient called to share scheduling conflict for our next scheduled appointment: re-scheduled with patient today; patient updates me today that she is feeling much better- states she dis consult with outpatient pharmacist and laid off the calcium she had been taking- states that this completely and promptly relieved her post-op constipation; spoke with pharmacist to obtain a different kind of calcium, states fells much better.  She also confirms that she is much better after recent mechanical fall- states "just a little sore;" denies concerns post-fall.   Health Maintenance (Status: Goal on track: YES.)  Provided education about fall risks, action to take for clinical concerns post- acute mechanical fall this morning Contacted patient for previously scheduled CCM RN CM initial assessment: patient immediately reports that she is lying down after having incurred a mechanical fall this morning Reports  post-op from recent parathyroidectomy on 05/03/21: states she went out to take a walk this morning for exercise and turned to change directions, tripped on curb, fell and hit head: reports gash to her eye from sunglasses; currently using ice packs; able to move all extremities, does not think she needs stitches; states she/ neighbors do not believe she has any broken bones; reports "sore"  Confirmed no light-headedness/ syncope prior to fall: she reports, "just lost my balance and fell over curb" Confirmed she has neighbors currently with her- she provides consent for me to speak with neighbors while phone on speaker mode: "Dianne Cashion," and "Tyler Deis" Confirmed patient normally lives alone with no one else in home: neighbors are in agreement to monitor patient's condition several times throughout day throughout next couple of days Discussed signs/ symptoms with patient and her neighbors that would necessitate seeking urgent/ emergent care: ongoing new headache, confusion, change in mental status: coached patient and neighbors on orientation assessment to perform with frequent ongoing assessments, including this evening prior to all going to bed Confirmed patient has scheduled post-op appointment with surgical provider 05/18/21-- she has not yet been released to drive: advised not to drive and to follow post-op instructions as per surgical provider Confirmed patient has no other concerns except acute mechanical fall today Provided patient and her neighbors with my direct contact information, should non-urgent clinical issues arise as a result of fall today Confirmed patient is not  on ACT therapy post-op and has no medication concerns Patient requests re-scheduling of initial call today, as she is tired/ upset/ sore after fall this morning, and we completed re-scheduling together, according to patient preference, after her follow up appointment with surgical provider  Patient Goals/Self-Care  Activities: Patient will self administer medications as prescribed Patient will attend all scheduled provider appointments Patient will call pharmacy for medication refills Patient will call provider office for new concerns or questions      Plan: Telephone follow up appointment with care management team member scheduled for:  Tuesday, June 05, 2021 at 9:00 am The patient has been provided with contact information for the care management team and has been advised to call with any health related questions or concerns.   [name], RN, BSN, [facility] RN Care Coordination- [place] [phone]: direct office [phone]: mobile

## 2021-05-16 DIAGNOSIS — E7800 Pure hypercholesterolemia, unspecified: Secondary | ICD-10-CM | POA: Diagnosis not present

## 2021-05-16 DIAGNOSIS — E785 Hyperlipidemia, unspecified: Secondary | ICD-10-CM | POA: Diagnosis not present

## 2021-05-17 ENCOUNTER — Other Ambulatory Visit: Payer: Self-pay | Admitting: Internal Medicine

## 2021-05-17 NOTE — Telephone Encounter (Signed)
Please refill as per office routine med refill policy (all routine meds to be refilled for 3 mo or monthly (per pt preference) up to one year from last visit, then month to month grace period for 3 mo, then further med refills will have to be denied) ? ?

## 2021-05-23 ENCOUNTER — Telehealth: Payer: Medicare HMO

## 2021-05-25 ENCOUNTER — Encounter: Payer: Self-pay | Admitting: Internal Medicine

## 2021-05-25 ENCOUNTER — Other Ambulatory Visit: Payer: Self-pay

## 2021-05-25 ENCOUNTER — Ambulatory Visit (INDEPENDENT_AMBULATORY_CARE_PROVIDER_SITE_OTHER): Payer: Medicare HMO | Admitting: Internal Medicine

## 2021-05-25 VITALS — BP 122/70 | HR 71 | Temp 97.9°F | Ht 65.0 in | Wt 147.2 lb

## 2021-05-25 DIAGNOSIS — R739 Hyperglycemia, unspecified: Secondary | ICD-10-CM

## 2021-05-25 DIAGNOSIS — E538 Deficiency of other specified B group vitamins: Secondary | ICD-10-CM

## 2021-05-25 DIAGNOSIS — E213 Hyperparathyroidism, unspecified: Secondary | ICD-10-CM | POA: Diagnosis not present

## 2021-05-25 DIAGNOSIS — E210 Primary hyperparathyroidism: Secondary | ICD-10-CM | POA: Diagnosis not present

## 2021-05-25 DIAGNOSIS — R69 Illness, unspecified: Secondary | ICD-10-CM | POA: Diagnosis not present

## 2021-05-25 DIAGNOSIS — Z23 Encounter for immunization: Secondary | ICD-10-CM

## 2021-05-25 DIAGNOSIS — F411 Generalized anxiety disorder: Secondary | ICD-10-CM | POA: Diagnosis not present

## 2021-05-25 DIAGNOSIS — E7800 Pure hypercholesterolemia, unspecified: Secondary | ICD-10-CM

## 2021-05-25 DIAGNOSIS — E559 Vitamin D deficiency, unspecified: Secondary | ICD-10-CM | POA: Diagnosis not present

## 2021-05-25 MED ORDER — ROSUVASTATIN CALCIUM 10 MG PO TABS: 10.0000 mg | ORAL_TABLET | Freq: Every day | ORAL | 3 refills | Status: DC

## 2021-05-25 NOTE — Progress Notes (Signed)
Patient ID: Robyn Bailey, female   DOB: [birth year], 80 y.o.   MRN: [id number]        Chief Complaint: follow up s/p hyperparathyroid surgury at [facility] about 2.5 wks ago       HPI:  Robyn Bailey is a 80 y.o. female here with above, overall doing ok, but unfortunately 3 days postop had a trip and fall on hard surface outside with right head and left arm with brusing , pain and tenderness now new resolved; still kind of hurts all over but mostly hurts to the lower back - Pt continues to have recurring left LBP without bowel or bladder change, fever, wt loss,  worsening LE pain/numbness/weakness.  Gets up quickly and walks fairly briskly without increased pain so does not believe she broke anything.  All of this experience with surgury and fall has caused marked increased stress with shakiness she is wondering is a new tremor.  Has some muscle relaxer at home and getting by also with tylenol and mobic prn.  Willing to start crestor for hld.  Trying to follow lower chol diet.         Wt Readings from Last 3 Encounters:  05/25/21 147 lb 3.2 oz (66.8 kg)  02/07/21 149 lb (67.6 kg)  02/02/21 146 lb (66.2 kg)   BP Readings from Last 3 Encounters:  05/25/21 122/70  02/07/21 112/70  02/02/21 134/84         Past Medical History:  Diagnosis Date   ABNORMAL ELECTROCARDIOGRAM 07/19/2007   ALLERGIC RHINITIS 07/19/2007   Anatomical narrow angle glaucoma 04/20/2009   ANXIETY 07/19/2007   BACK PAIN 11/08/2009   Blepharospasm 06/06/2008   COLONIC POLYPS, HX OF 07/19/2007   Cough 09/22/2007   DEPRESSION 07/19/2007   DIVERTICULOSIS, COLON 07/19/2007   Dyspnea    exertion   FATIGUE 04/20/2009   GERD (gastroesophageal reflux disease)    HYPERLIPIDEMIA 07/19/2007   Left sided sciatica 01/22/2011   LOW BACK PAIN 07/19/2007   OSTEOARTHRITIS 07/19/2007   OSTEOPOROSIS 07/19/2007   PERIPHERAL NEUROPATHY 06/08/2007   Ptosis of eyelid, bilateral    RHINITIS 02/27/2009   SINUSITIS- ACUTE-NOS 11/04/2007   SORE THROAT 02/27/2009    Past Surgical History:  Procedure Laterality Date   BREAST BIOPSY     CATARACT EXTRACTION, BILATERAL     COLONOSCOPY     EYE SURGERY     cataract   hammer toe surgury  2003   bilateral   PTOSIS REPAIR Bilateral 10/12/2018   Procedure: Bilateral ptosis eyelid correction external approach suture technique;  Surgeon: Irene Limbo, MD;  Location: [place];  Service: Plastics;  Laterality: Bilateral;   TONSILLECTOMY      reports that she has quit smoking. She has never used smokeless tobacco. She reports current alcohol use. She reports that she does not use drugs. family history includes Colon cancer in her maternal uncle and mother. No Known Allergies Current Outpatient Medications on File Prior to Visit  Medication Sig Dispense Refill   CALCIUM PO Take 650 mg by mouth.     ketoconazole (NIZORAL) 2 % shampoo Apply 1 application topically once a week.     Magnesium 250 MG TABS Take 250 mg by mouth 3 (three) times a week.     meloxicam (MOBIC) 15 MG tablet TAKE ONE TABLET BY MOUTH DAILY (Patient taking differently: Take 15 mg by mouth daily.) 90 tablet 3   MINOXIDIL, TOPICAL, 5 % SOLN Apply 1 application topically daily. Hair  pantoprazole (PROTONIX) 40 MG tablet TAKE ONE TABLET BY MOUTH TWICE A DAY 180 tablet 3   potassium gluconate 595 (99 K) MG TABS tablet Take 595 mg by mouth 3 (three) times a week.     Probiotic Product (ALIGN) 4 MG CAPS Take 4 mg by mouth See admin instructions. Every other month     tizanidine (ZANAFLEX) 2 MG capsule TAKE ONE CAPSULE BY MOUTH THREE TIMES A DAY 40 capsule 2   triamcinolone (NASACORT AQ) 55 MCG/ACT AERO nasal inhaler Place 2 sprays into the nose daily. (Patient taking differently: Place 2 sprays into the nose daily as needed (congestion).) 1 Inhaler 12   Vitamin D3 (VITAMIN D) 25 MCG tablet Take 1,000 Units by mouth daily.     hydrochlorothiazide (MICROZIDE) 12.5 MG capsule Take 1 capsule (12.5 mg total) by mouth daily. 30  capsule 11   No current facility-administered medications on file prior to visit.        ROS:  All others reviewed and negative.  Objective        PE:  BP 122/70 (BP Location: Right Arm, Patient Position: Sitting, Cuff Size: Normal)   Pulse 71   Temp 97.9 F (36.6 C) (Oral)   Ht '5\' 5"'$  (1.651 m)   Wt 147 lb 3.2 oz (66.8 kg)   SpO2 99%   BMI 24.50 kg/m                 Constitutional: Pt appears in NAD               HENT: Head: NCAT.                Right Ear: External ear normal.                 Left Ear: External ear normal.                Eyes: . Pupils are equal, round, and reactive to light. Conjunctivae and EOM are normal               Nose: without d/c or deformity               Neck: Neck supple. Gross normal ROM               Cardiovascular: Normal rate and regular rhythm.                 Pulmonary/Chest: Effort normal and breath sounds without rales or wheezing.                Abd:  Soft, NT, ND, + BS, no organomegaly                LS spine nontender, has mild to mod left lumbar paravertebral tender spasm               Neurological: Pt is alert. At baseline orientation, motor grossly intact               Skin: Skin is warm. No rashes, no other new lesions, LE edema - none               Psychiatric: Pt behavior is normal without agitation   Micro: none  Cardiac tracings I have personally interpreted today:  none  Pertinent Radiological findings (summarize): none   Lab Results  Component Value Date   WBC 5.2 02/02/2021   HGB 14.4 02/02/2021   HCT 44.0 02/02/2021   PLT 178 02/02/2021  GLUCOSE 79 11/17/2020   CHOL 215 (H) 11/17/2020   TRIG 109.0 11/17/2020   HDL 81.60 11/17/2020   LDLDIRECT 134.0 10/20/2013   LDLCALC 112 (H) 11/17/2020   ALT 16 11/17/2020   AST 18 11/17/2020   NA 139 11/17/2020   K 4.4 11/17/2020   CL 103 11/17/2020   CREATININE 0.82 11/17/2020   BUN 27 (H) 11/17/2020   CO2 31 11/17/2020   TSH 1.16 11/17/2020   Assessment/Plan:  Robyn Bailey is a 79 y.o. White or Caucasian [1] female with  has a past medical history of ABNORMAL ELECTROCARDIOGRAM (07/19/2007), ALLERGIC RHINITIS (07/19/2007), Anatomical narrow angle glaucoma (04/20/2009), ANXIETY (07/19/2007), BACK PAIN (11/08/2009), Blepharospasm (06/06/2008), COLONIC POLYPS, HX OF (07/19/2007), Cough (09/22/2007), DEPRESSION (07/19/2007), DIVERTICULOSIS, COLON (07/19/2007), Dyspnea, FATIGUE (04/20/2009), GERD (gastroesophageal reflux disease), HYPERLIPIDEMIA (07/19/2007), Left sided sciatica (01/22/2011), LOW BACK PAIN (07/19/2007), OSTEOARTHRITIS (07/19/2007), OSTEOPOROSIS (07/19/2007), PERIPHERAL NEUROPATHY (06/08/2007), Ptosis of eyelid, bilateral, RHINITIS (02/27/2009), SINUSITIS- ACUTE-NOS (11/04/2007), and SORE THROAT (02/27/2009).  Hyperparathyroidism, primary ([place]) With post op PTH and calcium normal per labs,  to f/u any worsening symptoms or concerns  Hypercalcemia Resolved,  to f/u any worsening symptoms or concerns  HLD (hyperlipidemia) Lab Results  Component Value Date   LDLCALC 112 (H) 11/17/2020   uncontrolled, pt to start statin crestor 10   Anxiety state With mild to mod acute on chronic situational worsening, declines change in tx at this time, reassured  Followup: Return in about 6 months (around 11/22/2021).  Cathlean Cower, MD 05/26/2021 8:02 PM [facility]

## 2021-05-25 NOTE — Patient Instructions (Signed)
Please take all new medication as prescribed - the crestor 10 mg per day  Please continue all other medications as before, and refills have been done if requested.  Please have the pharmacy call with any other refills you may need.  Please continue your efforts at being more active, low cholesterol diet, and weight control.  Please keep your appointments with your specialists as you may have planned  Please make an Appointment to return in 6 months, or sooner if needed, also with Lab Appointment for testing done 3-5 days before at the [facility] (so this is for TWO appointments - please see the scheduling desk as you leave)  Due to the ongoing Covid 19 pandemic, our lab now requires an appointment for any labs done at our office.  If you need labs done and do not have an appointment, please call our office ahead of time to schedule before presenting to the lab for your testing.

## 2021-05-26 ENCOUNTER — Encounter: Payer: Self-pay | Admitting: Internal Medicine

## 2021-05-26 NOTE — Assessment & Plan Note (Signed)
With post op PTH and calcium normal per labs,  to f/u any worsening symptoms or concerns

## 2021-05-26 NOTE — Addendum Note (Signed)
Addended by: Biagio Borg on: 05/26/2021 08:06 PM   Modules accepted: Orders

## 2021-05-26 NOTE — Assessment & Plan Note (Signed)
Lab Results  Component Value Date   LDLCALC 112 (H) 11/17/2020   uncontrolled, pt to start statin crestor 10

## 2021-05-26 NOTE — Assessment & Plan Note (Signed)
Resolved,  to f/u any worsening symptoms or concerns  

## 2021-05-26 NOTE — Assessment & Plan Note (Signed)
With mild to mod acute on chronic situational worsening, declines change in tx at this time, reassured

## 2021-06-05 ENCOUNTER — Ambulatory Visit (INDEPENDENT_AMBULATORY_CARE_PROVIDER_SITE_OTHER): Payer: Medicare HMO | Admitting: *Deleted

## 2021-06-05 DIAGNOSIS — E559 Vitamin D deficiency, unspecified: Secondary | ICD-10-CM

## 2021-06-05 DIAGNOSIS — E7800 Pure hypercholesterolemia, unspecified: Secondary | ICD-10-CM

## 2021-06-05 NOTE — Patient Instructions (Signed)
Visit Information  Robyn Bailey, it was nice talking with you today.   I look forward to talking to you again for an update on  Wednesday November 07, 2021 at 9:00 am- please be listening out for my call that day.  I will call as close to 9:00 am as possible.   If you need to cancel or re-schedule our telephone visit, please call [phone] and one of our care guides will be happy to assist you.   I look forward to hearing about your progress.   Please don't hesitate to contact me if I can be of assistance to you before our next scheduled telephone appointment.   Robyn Rack, RN, BSN, [facility] RN Care Coordination- [place] [phone]: direct office [phone]: mobile   PATIENT GOALS:  Goals Addressed             This Visit's Progress    Patient Self- Care Activities   On track    Timeframe:  Long-Range Goal Priority:  Medium Start Date:      05/08/21                       Expected End Date:        05/08/22               Patient will self administer medications as prescribed Patient will attend all scheduled provider appointments Patient will call pharmacy for medication refills Patient will call provider office for new concerns or questions Patient will continue to follow heart healthy, low cholesterol diet Patient will continue to stay active on a regular basis       Statin Intolerance Statin intolerance is the inability to take a certain type of cholesterol-lowering medicine (statin) because of unwanted side effects, such as muscle pain. Statins may be prescribed to decrease cholesterol levels and lower the risk for heart attack, heart disease, and stroke. People with statin intolerance may experience muscle pain and cramps (myalgia) that go away when the statin is stopped. What are the causes? The cause of this condition is not known. What increases the risk? You may be at higher risk for statin intolerance if you: Take more than  one type of cholesterol-lowering medicine at a time. Need a higher-than-normal dosage of a statin. Have any of these medical problems: A history of surgery or trauma. Kidney, liver, joint, or muscle disease. A low level of hormones that control how your body uses energy (hypothyroidism). A lack of vitamin D (deficiency). Have a family history of muscle aches with or without statin therapy. Drink a lot of alcohol. Have any of these characteristics: Being older than 80 years of age. Having a body size that is smaller than normal. Being female. Being of Asian descent. Take certain medicines, including: Certain medicines for mental illness (antipsychotics). Some types of antibiotics or antifungals. Certain medicines used for blood pressure or heart disease. Medicines that reduce the activity of the body's disease-fighting system (immunosuppressants). Medicines to treat hepatitis C and HIV or AIDS (human immunodeficiency virus or acquired immunodeficiency syndrome). Follow an intense exercise program. Drink a lot of grapefruit juice. What are the signs or symptoms? Symptoms of statin intolerance include: General muscle aches, or myalgia. This may feel similar to muscle aches that are caused by the flu. Muscle pain, tenderness, cramps, or weakness (myositis). Severe muscle pain, weakness, and raised blood CK levels (rhabdomyolysis). Symptoms usually go away when the statin is stopped. Rarely,  liver damage can also occur, which can cause: Dark yellow or brown urine. Light-colored stool (feces). Loss of appetite. Pain in the upper right abdomen. Unusual weakness or fatigue. Yellowing of the skin or the white parts of the eyes (jaundice). How is this diagnosed? This condition is diagnosed based on your symptoms, your medical history, and a physical exam. You may also have blood tests. How is this treated? Your health care provider may have you stop taking the statin for a short time to  see if your symptoms go away. Then your provider may restart your statin or: Change you to a different statin. Lower the dosage of your statin. Have you take your statin less often. Change you to another type of cholesterol-lowering medicine. Stop or change any medicines that might be interfering with your statin. Limit how much grapefruit juice you drink. Recommend stopping intense exercise. In most cases, statin intolerance can be managed, and you can continue to take a cholesterol-lowering medicine. Follow these instructions at home: Medicines Take your statin medicine as told by your health care provider. Do not stop taking the statin unless your health care provider tells you to stop. Take other over-the-counter and prescription medicines only as told by your health care provider. Check with your health care provider before taking any new medicines. Certain medicines can increase your risk for statin intolerance. Lifestyle If you drink alcohol: Limit how much you have to: 0-1 drink a day for women who are not pregnant. 0-2 drinks a day for men. Know how much alcohol is in a drink. In the [place] one drink equals one 12 oz bottle of beer (355 mL), one 5 oz glass of wine (148 mL), or one 1 oz glass of hard liquor (44 mL). Do not use any products that contain nicotine or tobacco. These products include cigarettes, chewing tobacco, and vaping devices, such as e-cigarettes. If you need help quitting, ask your health care provider. General instructions  Have blood tests to check CK levels or liver enzymes as told by your health care provider. Exercise as directed. Ask your health care provider what exercises are best for you. Do not start a new exercise program before talking about it with your health care provider. Follow instructions from your health care provider about eating or drinking restrictions. Your health care provider may recommend: Limiting the amount of grapefruit juice you  drink, or not drinking it at all. Eating a diet that is low in saturated fats and high in fiber. Maintain a healthy weight with diet and exercise. Keep all follow-up visits. This is important. Contact a health care provider if: You have any symptoms of statin intolerance. Summary Statins are important medicines for decreasing your cholesterol, which may reduce your risk for heart attack and stroke. Some people are not able to continue taking a particular statin because of muscle problems (myalgia) or other side effects. Myalgia is the most common symptom of statin intolerance. Often, the muscle pain and cramps from myalgia go away when the statin is stopped. Although rare, liver damage can occur as a result of statin intolerance. You should have routine blood tests to check your liver enzymes. In most cases, statin intolerance can be managed, and you can continue to take a cholesterol-lowering medicine. This information is not intended to replace advice given to you by your health care provider. Make sure you discuss any questions you have with your health care provider. Document Revised: 11/02/2020 Document Reviewed: 11/02/2020 Elsevier Patient Education  2022  [place].    Cholesterol Content in Foods Cholesterol is a waxy, fat-like substance that helps to carry fat in the blood. The body needs cholesterol in small amounts, but too much cholesterol can cause damage to the arteries and heart. Most people should eat less than 200 milligrams (mg) of cholesterol a day. Foods with cholesterol Cholesterol is found in animal-based foods, such as meat, seafood, and dairy. Generally, low-fat dairy and lean meats have less cholesterol than full-fat dairy and fatty meats. The milligrams of cholesterol per serving (mg per serving) of common cholesterol-containing foods are listed below. Meat and other proteins Egg -- one large whole egg has 186 mg. Veal shank -- 4 oz has 141 mg. Lean ground  [place] (93% lean) -- 4 oz has 118 mg. Fat-trimmed lamb loin -- 4 oz has 106 mg. Lean ground beef (90% lean) -- 4 oz has 100 mg. Lobster -- 3.5 oz has 90 mg. Pork loin chops -- 4 oz has 86 mg. Canned salmon -- 3.5 oz has 83 mg. Fat-trimmed beef top loin -- 4 oz has 78 mg. Frankfurter -- 1 frank (3.5 oz) has 77 mg. Crab -- 3.5 oz has 71 mg. Roasted chicken without skin, white meat -- 4 oz has 66 mg. Light bologna -- 2 oz has 45 mg. Deli-cut [place] -- 2 oz has 31 mg. Canned tuna -- 3.5 oz has 31 mg. Berniece Salines -- 1 oz has 29 mg. Oysters and mussels (raw) -- 3.5 oz has 25 mg. Mackerel -- 1 oz has 22 mg. Trout -- 1 oz has 20 mg. Pork sausage -- 1 link (1 oz) has 17 mg. Salmon -- 1 oz has 16 mg. Tilapia -- 1 oz has 14 mg. Dairy Soft-serve ice cream --  cup (4 oz) has 103 mg. Whole-milk yogurt -- 1 cup (8 oz) has 29 mg. Cheddar cheese -- 1 oz has 28 mg. American cheese -- 1 oz has 28 mg. Whole milk -- 1 cup (8 oz) has 23 mg. 2% milk -- 1 cup (8 oz) has 18 mg. Cream cheese -- 1 tablespoon (Tbsp) has 15 mg. Cottage cheese --  cup (4 oz) has 14 mg. Low-fat (1%) milk -- 1 cup (8 oz) has 10 mg. Sour cream -- 1 Tbsp has 8.5 mg. Low-fat yogurt -- 1 cup (8 oz) has 8 mg. Nonfat Greek yogurt -- 1 cup (8 oz) has 7 mg. Half-and-half cream -- 1 Tbsp has 5 mg. Fats and oils Cod liver oil -- 1 tablespoon (Tbsp) has 82 mg. Butter -- 1 Tbsp has 15 mg. Lard -- 1 Tbsp has 14 mg. Bacon grease -- 1 Tbsp has 14 mg. Mayonnaise -- 1 Tbsp has 5-10 mg. Margarine -- 1 Tbsp has 3-10 mg. Exact amounts of cholesterol in these foods may vary depending on specific ingredients and brands. Foods without cholesterol Most plant-based foods do not have cholesterol unless you combine them with a food that has cholesterol. Foods without cholesterol include: Grains and cereals. Vegetables. Fruits. Vegetable oils, such as olive, canola, and sunflower oil. Legumes, such as peas, beans, and lentils. Nuts and seeds. Egg  whites. Summary The body needs cholesterol in small amounts, but too much cholesterol can cause damage to the arteries and heart. Most people should eat less than 200 milligrams (mg) of cholesterol a day. This information is not intended to replace advice given to you by your health care provider. Make sure you discuss any questions you have with your health care provider. Document Revised: 12/14/2019  Document Reviewed: 01/24/2020 Elsevier Patient Education  2022 [place].   Patient verbalizes understanding of instructions provided today and agrees to view in MyChart Telephone follow up appointment with care management team member scheduled for:   Wednesday November 07, 2021 at 9:00 am The patient has been provided with contact information for the care management team and has been advised to call with any health related questions or concerns   Robyn Rack, RN, BSN, [place] [phone]: direct office [phone]: mobile

## 2021-06-05 NOTE — Chronic Care Management (AMB) (Signed)
Chronic Care Management   CCM RN Visit Note  06/05/2021 Name: Robyn Bailey MRN: [id number] DOB: [birth year]  Subjective: Robyn Bailey is a 80 y.o. year old female who is a primary care patient of Biagio Borg, MD. The care management team was consulted for assistance with disease management and care coordination needs.    Engaged with patient by telephone for follow up visit in response to provider referral for case management and/or care coordination services.   Consent to Services:  The patient was given information about Chronic Care Management services, agreed to services, and gave verbal consent prior to initiation of services.  Please see initial visit note for detailed documentation.  Patient agreed to services and verbal consent obtained.   Assessment: Review of patient past medical history, allergies, medications, health status, including review of consultants reports, laboratory and other test data, was performed as part of comprehensive evaluation and provision of chronic care management services.   SDOH (Social Determinants of Health) assessments and interventions performed:  SDOH Interventions    Flowsheet Row Most Recent Value  SDOH Interventions   Housing Interventions Intervention Not Indicated      CCM Care Plan No Known Allergies  Outpatient Encounter Medications as of 06/05/2021  Medication Sig   CALCIUM PO Take 650 mg by mouth.   hydrochlorothiazide (MICROZIDE) 12.5 MG capsule Take 1 capsule (12.5 mg total) by mouth daily.   ketoconazole (NIZORAL) 2 % shampoo Apply 1 application topically once a week.   Magnesium 250 MG TABS Take 250 mg by mouth 3 (three) times a week.   meloxicam (MOBIC) 15 MG tablet TAKE ONE TABLET BY MOUTH DAILY (Patient taking differently: Take 15 mg by mouth daily.)   MINOXIDIL, TOPICAL, 5 % SOLN Apply 1 application topically daily. Hair   pantoprazole (PROTONIX) 40 MG tablet TAKE ONE TABLET BY MOUTH TWICE A DAY   potassium gluconate  595 (99 K) MG TABS tablet Take 595 mg by mouth 3 (three) times a week.   Probiotic Product (ALIGN) 4 MG CAPS Take 4 mg by mouth See admin instructions. Every other month   rosuvastatin (CRESTOR) 10 MG tablet Take 1 tablet (10 mg total) by mouth daily.   tizanidine (ZANAFLEX) 2 MG capsule TAKE ONE CAPSULE BY MOUTH THREE TIMES A DAY   triamcinolone (NASACORT AQ) 55 MCG/ACT AERO nasal inhaler Place 2 sprays into the nose daily. (Patient taking differently: Place 2 sprays into the nose daily as needed (congestion).)   Vitamin D3 (VITAMIN D) 25 MCG tablet Take 1,000 Units by mouth daily.   No facility-administered encounter medications on file as of 06/05/2021.   Patient Active Problem List   Diagnosis Date Noted   Right sided sciatica 09/11/2020   Muscle cramps 08/29/2020   Mild peripheral edema 02/29/2020   Piriformis syndrome of right side 12/13/2019   Leg pain, lateral, right 11/17/2019   Hyperparathyroidism, primary ([place]) 12/21/2018   Hypercalcemia 12/21/2018   Vitamin D insufficiency 12/21/2018   Age-related osteoporosis without current pathological fracture 12/21/2018   Ptosis of eyelid, bilateral 10/12/2018   Ptosis 02/12/2018   Palpitations 11/12/2017   Radiculitis of right cervical region 11/11/2017   Eustachian tube dysfunction 11/08/2016   Tinnitus of left ear 09/15/2015   Insect stings 04/22/2013   Rash and nonspecific skin eruption 04/22/2013   GERD (gastroesophageal reflux disease) 09/28/2012   Left lumbar radiculopathy 02/01/2011   Left sided sciatica 01/22/2011   Preventative health care 01/21/2011   Acute angle-closure glaucoma 04/20/2009   FATIGUE 04/20/2009  Blepharospasm 06/06/2008   HLD (hyperlipidemia) 07/19/2007   Anxiety state 07/19/2007   Depression 07/19/2007   Allergic rhinitis 07/19/2007   Diverticulosis of colon 07/19/2007   OSTEOARTHRITIS 07/19/2007   LOW Bailey PAIN 07/19/2007   Osteoporosis 07/19/2007   ABNORMAL ELECTROCARDIOGRAM 07/19/2007    MACULAR DEGENERATION, HX OF 07/19/2007   COLONIC POLYPS, HX OF 07/19/2007   PERIPHERAL NEUROPATHY 06/08/2007   Conditions to be addressed/monitored:   HLD and Vitamin D deficiency  Care Plan : RN Care Manager Plan of Care  Updates made by Knox Royalty, RN since 06/05/2021 12:00 AM     Problem: Chronic Disease Management Needs   Priority: Medium     Long-Range Goal: Development of plan of care for long term chronic disease management   Start Date: 05/08/2021  Expected End Date: 05/08/2022  Priority: Medium  Note:   Current Barriers:  Chronic Disease Management support and education needs related to HLD and OA/ Vitamin D deficiency  RNCM Clinical Goal(s):  Patient will demonstrate ongoing health management independence for HLD/ OA/ Vitamin D deficiency  through collaboration with RN Care manager, provider, and care team.   Interventions: 1:1 collaboration with primary care provider regarding development and update of comprehensive plan of care as evidenced by provider attestation and co-signature Inter-disciplinary care team collaboration (see longitudinal plan of care) Evaluation of current treatment plan related to  self management and patient's adherence to plan as established by provider  Health Maintenance (Status: Goal on track: YES.)  Advised patient to discuss Cholesterol/Lipid screen      with primary care provider  Discussed current clinical condition with patient- reports doing well, no acute clinical concerns Reviewed recent provider appointments with patient: 05/11/21- surgical follow up; 05/23/21- dentist; 05/25/21- PCP Confirmed patient taking Rosuvastatin 10 mg po as instructed post- PCP appointment: taking in morning; continues to have muscle soreness: not sure if this is related to recent mechanical fall or addition of medication: advised her to continue taking and if muscle soreness not improved over time- to contact PCP Continues to stay active on regular basis-  walks for exercise frequently each week; confirmed no new falls Initial assessment completed; provided verbal and printed educational material around low cholesterol diet Reviewed with patient upcoming provider appointments: she plans to attend all as scheduled; continues driving self to appointments  Patient Goals/Self-Care Activities: Patient will self administer medications as prescribed Patient will attend all scheduled provider appointments Patient will call pharmacy for medication refills Patient will call provider office for new concerns or questions Patient will continue to follow heart healthy, low cholesterol diet Patient will continue to stay active on a regular basis    Plan: Telephone follow up appointment with care management team member scheduled for:  Wednesday November 07, 2021 at 9:00 am The patient has been provided with contact information for the care management team and has been advised to call with any health related questions or concerns.   [name], RN, BSN, [facility] RN Care Coordination- [place] [phone]: direct office [phone]: mobile

## 2021-06-15 DIAGNOSIS — E7800 Pure hypercholesterolemia, unspecified: Secondary | ICD-10-CM | POA: Diagnosis not present

## 2021-07-05 ENCOUNTER — Other Ambulatory Visit: Payer: Self-pay

## 2021-07-05 ENCOUNTER — Ambulatory Visit (INDEPENDENT_AMBULATORY_CARE_PROVIDER_SITE_OTHER): Payer: Medicare HMO | Admitting: Emergency Medicine

## 2021-07-05 ENCOUNTER — Encounter: Payer: Self-pay | Admitting: Emergency Medicine

## 2021-07-05 DIAGNOSIS — U071 COVID-19: Secondary | ICD-10-CM

## 2021-07-05 MED ORDER — NIRMATRELVIR/RITONAVIR (PAXLOVID) TABLET (RENAL DOSING)
2.0000 | ORAL_TABLET | Freq: Two times a day (BID) | ORAL | 0 refills | Status: AC
Start: 2021-07-05 — End: 2021-07-10

## 2021-07-05 NOTE — Progress Notes (Signed)
Telemedicine Encounter- SOAP NOTE Established Patient Patient: Home  Provider: Office   Patient present only  This telephone encounter was conducted with the patient's (or proxy's) verbal consent via audio telecommunications: yes/no: Yes Patient was instructed to have this encounter in a suitably private space; and to only have persons present to whom they give permission to participate. In addition, patient identity was confirmed by use of name plus two identifiers (DOB and address).  I discussed the limitations, risks, security and privacy concerns of performing an evaluation and management service by telephone and the availability of in person appointments. I also discussed with the patient that there may be a patient responsible charge related to this service. The patient expressed understanding and agreed to proceed.  I spent a total of TIME; 0 MIN TO 60 MIN: 20 minutes talking with the patient or their proxy.  Chief complaint: Flulike symptoms, positive COVID  Subjective   Robyn Bailey is a 80 y.o. female established patient. Telephone visit today complaining of flulike symptoms that started 3 days ago with cough, sore throat, and sinus congestion.  Denies fever or chills.  Denies difficulty breathing or chest pain.  Able to eat and drink.  Denies nausea or vomiting.  Denies abdominal pain or diarrhea.  Tested positive for COVID twice at home.  No other ill persons at home at present time.  No other complaints or significant symptomatology.  HPI   Patient Active Problem List   Diagnosis Date Noted  . Right sided sciatica 09/11/2020  . Muscle cramps 08/29/2020  . Mild peripheral edema 02/29/2020  . Piriformis syndrome of right side 12/13/2019  . Leg pain, lateral, right 11/17/2019  . Hyperparathyroidism, primary ([place]) 12/21/2018  . Hypercalcemia 12/21/2018  . Vitamin D insufficiency 12/21/2018  . Age-related osteoporosis without current pathological fracture 12/21/2018  .  Ptosis of eyelid, bilateral 10/12/2018  . Ptosis 02/12/2018  . Palpitations 11/12/2017  . Radiculitis of right cervical region 11/11/2017  . Eustachian tube dysfunction 11/08/2016  . Tinnitus of left ear 09/15/2015  . Insect stings 04/22/2013  . Rash and nonspecific skin eruption 04/22/2013  . GERD (gastroesophageal reflux disease) 09/28/2012  . Left lumbar radiculopathy 02/01/2011  . Left sided sciatica 01/22/2011  . Preventative health care 01/21/2011  . Acute angle-closure glaucoma 04/20/2009  . FATIGUE 04/20/2009  . Blepharospasm 06/06/2008  . HLD (hyperlipidemia) 07/19/2007  . Anxiety state 07/19/2007  . Depression 07/19/2007  . Allergic rhinitis 07/19/2007  . Diverticulosis of colon 07/19/2007  . OSTEOARTHRITIS 07/19/2007  . LOW BACK PAIN 07/19/2007  . Osteoporosis 07/19/2007  . ABNORMAL ELECTROCARDIOGRAM 07/19/2007  . MACULAR DEGENERATION, HX OF 07/19/2007  . COLONIC POLYPS, HX OF 07/19/2007  . PERIPHERAL NEUROPATHY 06/08/2007    Past Medical History:  Diagnosis Date  . ABNORMAL ELECTROCARDIOGRAM 07/19/2007  . ALLERGIC RHINITIS 07/19/2007  . Anatomical narrow angle glaucoma 04/20/2009  . ANXIETY 07/19/2007  . BACK PAIN 11/08/2009  . Blepharospasm 06/06/2008  . COLONIC POLYPS, HX OF 07/19/2007  . Cough 09/22/2007  . DEPRESSION 07/19/2007  . DIVERTICULOSIS, COLON 07/19/2007  . Dyspnea    exertion  . FATIGUE 04/20/2009  . GERD (gastroesophageal reflux disease)   . HYPERLIPIDEMIA 07/19/2007  . Left sided sciatica 01/22/2011  . LOW BACK PAIN 07/19/2007  . OSTEOARTHRITIS 07/19/2007  . OSTEOPOROSIS 07/19/2007  . PERIPHERAL NEUROPATHY 06/08/2007  . Ptosis of eyelid, bilateral   . RHINITIS 02/27/2009  . SINUSITIS- ACUTE-NOS 11/04/2007  . SORE THROAT 02/27/2009    Current Outpatient Medications  Medication  Sig Dispense Refill  . nirmatrelvir/ritonavir EUA, renal dosing, (PAXLOVID) 10 x 150 MG & 10 x 100MG  TABS Take 2 tablets by mouth 2 (two) times daily for 5 days. (Take nirmatrelvir  150 mg one tablet twice daily for 5 days and ritonavir 100 mg one tablet twice daily for 5 days) Patient GFR is 53 20 tablet 0  . CALCIUM PO Take 650 mg by mouth.    . hydrochlorothiazide (MICROZIDE) 12.5 MG capsule Take 1 capsule (12.5 mg total) by mouth daily. 30 capsule 11  . ketoconazole (NIZORAL) 2 % shampoo Apply 1 application topically once a week.    . Magnesium 250 MG TABS Take 250 mg by mouth 3 (three) times a week.    . meloxicam (MOBIC) 15 MG tablet TAKE ONE TABLET BY MOUTH DAILY (Patient taking differently: Take 15 mg by mouth daily.) 90 tablet 3  . MINOXIDIL, TOPICAL, 5 % SOLN Apply 1 application topically daily. Hair    . pantoprazole (PROTONIX) 40 MG tablet TAKE ONE TABLET BY MOUTH TWICE A DAY 180 tablet 3  . potassium gluconate 595 (99 K) MG TABS tablet Take 595 mg by mouth 3 (three) times a week.    . Probiotic Product (ALIGN) 4 MG CAPS Take 4 mg by mouth See admin instructions. Every other month    . rosuvastatin (CRESTOR) 10 MG tablet Take 1 tablet (10 mg total) by mouth daily. 90 tablet 3  . tizanidine (ZANAFLEX) 2 MG capsule TAKE ONE CAPSULE BY MOUTH THREE TIMES A DAY 40 capsule 2  . triamcinolone (NASACORT AQ) 55 MCG/ACT AERO nasal inhaler Place 2 sprays into the nose daily. (Patient taking differently: Place 2 sprays into the nose daily as needed (congestion).) 1 Inhaler 12  . Vitamin D3 (VITAMIN D) 25 MCG tablet Take 1,000 Units by mouth daily.     No current facility-administered medications for this visit.    No Known Allergies  Social History   Socioeconomic History  . Marital status: Single    Spouse name: Not on file  . Number of children: Not on file  . Years of education: Not on file  . Highest education level: Not on file  Occupational History  . 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 Occupation: part time research legal  Tobacco Use  . Smoking status: Former  . Smokeless tobacco: Never  Vaping Use  . Vaping Use: Never used   Substance and Sexual Activity  . Alcohol use: Yes    Alcohol/week: 0.0 standard drinks    Comment: occasional glass of wine; rare now b/c acid reflux  . Drug use: No  . Sexual activity: Not Currently    Birth control/protection: Post-menopausal  Other Topics Concern  . Not on file  Social History Narrative  . Not on file   Social Determinants of Health   Financial Resource Strain: Low Risk   . Difficulty of Paying Living Expenses: Not hard at all  Food Insecurity: No Food Insecurity  . Worried About Charity fundraiser in the Last Year: Never true  . Ran Out of Food in the Last Year: Never true  Transportation Needs: No Transportation Needs  . Lack of Transportation (Medical): No  . Lack of Transportation (Non-Medical): No  Physical Activity: Sufficiently Active  . Days of Exercise per Week: 5 days  . Minutes of Exercise per Session: 30 min  Stress: No Stress Concern Present  . Feeling of Stress : Not at all  Social Connections: Socially  Integrated  . Frequency of Communication with Friends and Family: More than three times a week  . Frequency of Social Gatherings with Friends and Family: Three times a week  . Attends Religious Services: More than 4 times per year  . Active Member of Clubs or Organizations: Yes  . Attends Archivist Meetings: More than 4 times per year  . Marital Status: Living with partner  Intimate Partner Violence: Not on file    Review of Systems  Constitutional: Negative.  Negative for chills and fever.  HENT:  Positive for congestion and sore throat.   Respiratory:  Positive for cough. Negative for shortness of breath and wheezing.   Cardiovascular: Negative.  Negative for chest pain and palpitations.  Gastrointestinal:  Negative for abdominal pain, diarrhea, nausea and vomiting.  Genitourinary: Negative.   Skin: Negative.   Neurological:  Negative for dizziness and headaches.  All other systems reviewed and are negative.  Objective   Alert and oriented x3 in no apparent respiratory distress. Vitals as reported by the patient: There were no vitals filed for this visit.  Diagnoses and all orders for this visit:  COVID-19 virus infection -     nirmatrelvir/ritonavir EUA, renal dosing, (PAXLOVID) 10 x 150 MG & 10 x 100MG  TABS; Take 2 tablets by mouth 2 (two) times daily for 5 days. (Take nirmatrelvir 150 mg one tablet twice daily for 5 days and ritonavir 100 mg one tablet twice daily for 5 days) Patient GFR is 53  Clinically stable.  No red flag signs or symptoms.  Moderate risk for COVID complications.  Day 3 of illness.  Recommend antiviral medication. ED precautions given.  COVID precautions given. Advised to contact the office if no better or worse during the next several days.   I discussed the assessment and treatment plan with the patient. The patient was provided an opportunity to ask questions and all were answered. The patient agreed with the plan and demonstrated an understanding of the instructions.   The patient was advised to call back or seek an in-person evaluation if the symptoms worsen or if the condition fails to improve as anticipated.  I provided 20 minutes of non-face-to-face time during this encounter.  Horald Pollen, MD  Primary Care at [facility]

## 2021-07-10 ENCOUNTER — Telehealth: Payer: Self-pay | Admitting: Internal Medicine

## 2021-07-10 MED ORDER — MOLNUPIRAVIR EUA 200MG CAPSULE: 4.0000 | ORAL_CAPSULE | Freq: Two times a day (BID) | ORAL | 0 refills | Status: AC

## 2021-07-10 NOTE — Telephone Encounter (Signed)
Patient states she was prescribed nirmatrelvir/ritonavir EUA, renal dosing, (PAXLOVID) 10 x 150 MG & 10 x 100MG  TABS due to testing positive for Covid  Patient states medication gave her an upset stomach  Patient states she discontinued taking the medication  Patient requesting a call back to discuss medication options

## 2021-07-10 NOTE — Telephone Encounter (Signed)
Patient notified. Per patient she is on day 7 and inquiring if COVID treatment is still needed. Patient states that she still has fatigue, upset stomach, and coughing up mucus. Please advise

## 2021-07-10 NOTE — Telephone Encounter (Signed)
Patient notified and will let us know if she needs any further treatment

## 2021-07-10 NOTE — Telephone Encounter (Signed)
Ok to hold on the new antibiotic then.  Let us know if she needs nausea med or cough med   thanks

## 2021-07-10 NOTE — Telephone Encounter (Signed)
Ok to try the other option = molnupiravir

## 2021-09-14 DIAGNOSIS — M2041 Other hammer toe(s) (acquired), right foot: Secondary | ICD-10-CM | POA: Diagnosis not present

## 2021-09-14 DIAGNOSIS — M2042 Other hammer toe(s) (acquired), left foot: Secondary | ICD-10-CM | POA: Insufficient documentation

## 2021-09-14 DIAGNOSIS — N1831 Chronic kidney disease, stage 3a: Secondary | ICD-10-CM | POA: Diagnosis not present

## 2021-11-07 ENCOUNTER — Ambulatory Visit (INDEPENDENT_AMBULATORY_CARE_PROVIDER_SITE_OTHER): Payer: Medicare HMO | Admitting: *Deleted

## 2021-11-07 DIAGNOSIS — E7800 Pure hypercholesterolemia, unspecified: Secondary | ICD-10-CM

## 2021-11-07 DIAGNOSIS — E559 Vitamin D deficiency, unspecified: Secondary | ICD-10-CM

## 2021-11-07 DIAGNOSIS — R739 Hyperglycemia, unspecified: Secondary | ICD-10-CM

## 2021-11-07 NOTE — Chronic Care Management (AMB) (Signed)
Chronic Care Management   CCM RN Visit Note  11/07/2021 Name: Robyn Bailey MRN: [id number] DOB: 03-Mar-1941  Subjective: Robyn Bailey is a 81 y.o. year old female who is a primary care patient of Biagio Borg, MD. The care management team was consulted for assistance with disease management and care coordination needs.    Engaged with patient by telephone for follow up visit in response to provider referral for case management and/or care coordination services.   Consent to Services:  The patient was given information about Chronic Care Management services, agreed to services, and gave verbal consent prior to initiation of services.  Please see initial visit note for detailed documentation.  Patient agreed to services and verbal consent obtained.   Assessment: Review of patient past medical history, allergies, medications, health status, including review of consultants reports, laboratory and other test data, was performed as part of comprehensive evaluation and provision of chronic care management services.   SDOH (Social Determinants of Health) assessments and interventions performed:  SDOH Interventions    Flowsheet Row Most Recent Value  SDOH Interventions   Food Insecurity Interventions Intervention Not Indicated  [Denies food insecurity]  Housing Interventions Intervention Not Indicated  [continues to live alone,  no housing concerns]  Transportation Interventions Intervention Not Indicated  [Still drives self]     CCM Care Plan  No Known Allergies  Outpatient Encounter Medications as of 11/07/2021  Medication Sig   CALCIUM PO Take 650 mg by mouth.   hydrochlorothiazide (MICROZIDE) 12.5 MG capsule Take 1 capsule (12.5 mg total) by mouth daily.   ketoconazole (NIZORAL) 2 % shampoo Apply 1 application topically once a week.   Magnesium 250 MG TABS Take 250 mg by mouth 3 (three) times a week.   meloxicam (MOBIC) 15 MG tablet TAKE ONE TABLET BY MOUTH DAILY (Patient taking  differently: Take 15 mg by mouth daily.)   MINOXIDIL, TOPICAL, 5 % SOLN Apply 1 application topically daily. Hair   pantoprazole (PROTONIX) 40 MG tablet TAKE ONE TABLET BY MOUTH TWICE A DAY   potassium gluconate 595 (99 K) MG TABS tablet Take 595 mg by mouth 3 (three) times a week.   Probiotic Product (ALIGN) 4 MG CAPS Take 4 mg by mouth See admin instructions. Every other month   rosuvastatin (CRESTOR) 10 MG tablet Take 1 tablet (10 mg total) by mouth daily.   tizanidine (ZANAFLEX) 2 MG capsule TAKE ONE CAPSULE BY MOUTH THREE TIMES A DAY   triamcinolone (NASACORT AQ) 55 MCG/ACT AERO nasal inhaler Place 2 sprays into the nose daily. (Patient taking differently: Place 2 sprays into the nose daily as needed (congestion).)   Vitamin D3 (VITAMIN D) 25 MCG tablet Take 1,000 Units by mouth daily.   No facility-administered encounter medications on file as of 11/07/2021.   Patient Active Problem List   Diagnosis Date Noted   Right sided sciatica 09/11/2020   Muscle cramps 08/29/2020   Mild peripheral edema 02/29/2020   Piriformis syndrome of right side 12/13/2019   Leg pain, lateral, right 11/17/2019   Hyperparathyroidism, primary ([place]) 12/21/2018   Hypercalcemia 12/21/2018   Vitamin D insufficiency 12/21/2018   Age-related osteoporosis without current pathological fracture 12/21/2018   Ptosis of eyelid, bilateral 10/12/2018   Ptosis 02/12/2018   Palpitations 11/12/2017   Radiculitis of right cervical region 11/11/2017   Eustachian tube dysfunction 11/08/2016   Tinnitus of left ear 09/15/2015   Insect stings 04/22/2013   Rash and nonspecific skin eruption 04/22/2013   GERD (gastroesophageal reflux  disease) 09/28/2012   Left lumbar radiculopathy 02/01/2011   Left sided sciatica 01/22/2011   Preventative health care 01/21/2011   Acute angle-closure glaucoma 04/20/2009   FATIGUE 04/20/2009   Blepharospasm 06/06/2008   HLD (hyperlipidemia) 07/19/2007   Anxiety state 07/19/2007    Depression 07/19/2007   Allergic rhinitis 07/19/2007   Diverticulosis of colon 07/19/2007   OSTEOARTHRITIS 07/19/2007   LOW BACK PAIN 07/19/2007   Osteoporosis 07/19/2007   ABNORMAL ELECTROCARDIOGRAM 07/19/2007   MACULAR DEGENERATION, HX OF 07/19/2007   COLONIC POLYPS, HX OF 07/19/2007   PERIPHERAL NEUROPATHY 06/08/2007   Conditions to be addressed/monitored: HLD, health maintenance, and fall prevention  Care Plan : RN Care Manager Plan of Care  Updates made by Knox Royalty, RN since 11/07/2021 12:00 AM     Problem: Chronic Disease Management Needs   Priority: Medium     Long-Range Goal: Ongoing adherence to established plan of care for long term chronic disease management   Start Date: 05/08/2021  Expected End Date: 05/08/2022  Priority: Medium  Note:   Current Barriers:  Chronic Disease Management support and education needs related to HLD and OA/ Vitamin D deficiency  RNCM Clinical Goal(s):  Patient will demonstrate ongoing health management independence for HLD/ OA/ Vitamin D deficiency  through collaboration with RN Care manager, provider, and care team.   Interventions: 1:1 collaboration with primary care provider regarding development and update of comprehensive plan of care as evidenced by provider attestation and co-signature Inter-disciplinary care team collaboration (see longitudinal plan of care) Evaluation of current treatment plan related to  self management and patient's adherence to plan as established by provider 11/07/21: SDOH updated: no new/ unmet concerns identified Pain assessment updated: denies acute/ chronic pain Falls assessment updated: denies new/ recent falls since last fall on 05/08/21; does not use assistive devices; previously provided education around fall risks/ prevention reinforced; positive reinforcement provided with encouragement to continue efforts; confirmed patient continues taking calcium and vit D3 Discussed medications with patient:  she endorses ongoing adherence without recent changes or concerns around medications; confirms she is taking medication for cholesterol as prescribed; denies myalgias  Health Maintenance (Status: 11/07/21: Goal on track: YES.) Long term goal Advised patient to discuss Cholesterol/Lipid screen      with primary care provider  Discussed current clinical condition with patient- reports doing well, no acute clinical concerns-- mentions that she has had sinus congestion recently, presumably from pollen in air; is taking OTC medications as needed; confirmed she has plans to attend scheduled PCP appointment 11/22/21-- encouraged her to follow up sooner if she does not improve or gets worse-- she verbalizes understanding/ agreement with same Confirmed patient remains active/ independent in all self-health management ADL's/ iADL's Discussed recent COVID infection with patient (October 2022)- she denies ongoing effects, reports has fully recuperated Discussed issue with recently reported hammertoe concerns: reports she was considering having surgery to correct this, however, has since changed her mind- reports she "is able to live with it" Confirms patient continues following heart healthy, low salt, low cholesterol diet: positive reinforcement provided with encouragement to continue efforts; will provide educational material through [place] around foods high in cholesterol  Patient Goals/Self-Care Activities: As evidenced by review of EHR, collaboration with care team, and patient reporting during CCM RN CM outreach,  Patient Mavi will: Take medications as prescribed Attend all scheduled provider appointments- if your sinus congestion does not improve or gets worse, please call Dr. Gwynn Burly office to schedule an appointment Call pharmacy for medication refills  Call provider office for new concerns or questions Continue to follow heart healthy, low cholesterol diet Continue to stay active on a regular  basis Keep up the great work to prevent falls    Plan: Telephone follow up appointment with care management team member scheduled for:  Tuesday, May 07, 2022 at 9:00 am The patient has been provided with contact information for the care management team and has been advised to call with any health related questions or concerns   [name], RN, BSN, [place] [phone]: direct office

## 2021-11-07 NOTE — Patient Instructions (Addendum)
Visit Information  Robyn Bailey, thank you for taking time to talk with me today. Please don't hesitate to contact me if I can be of assistance to you before our next scheduled telephone appointment.  Below are the goals we discussed today:  Patient Self-Care Activities: Patient Robyn Bailey will: Take medications as prescribed Attend all scheduled provider appointments- if your sinus congestion does not improve or gets worse, please call Robyn Bailey office to schedule an appointment Call pharmacy for medication refills Call provider office for new concerns or questions Continue to follow heart healthy, low cholesterol diet Continue to stay active on a regular basis Keep up the great work to prevent falls  Our next scheduled telephone follow up visit/ appointment with care management team member is scheduled on:   Tuesday, May 07, 2022 at 9:00 am- This is a PHONE CALL appointment  If you need to cancel or re-schedule our visit, please call [phone] and our care guide team will be happy to assist you.   I look forward to hearing about your progress.   Robyn Rack, RN, BSN, [place] [phone]: direct office  if you are experiencing a Mental Health or [place] or need someone to talk to, please  call the Suicide and Crisis Lifeline: 988 call the [place] National Suicide Prevention Lifeline: 802-647-7066 or TTY: (325)166-9650 TTY (857)495-4202) to talk to a trained counselor call 1-800-273-TALK (toll free, 24 hour hotline) go to [facility] [address], [place] [phone]) call 911   Patient verbalizes understanding of instructions and care plan provided today and agrees to view in [place]. Active MyChart status confirmed with patient  Cholesterol Content in Foods Cholesterol is a waxy, fat-like substance that helps to carry fat in the blood. The body needs  cholesterol in small amounts, but too much cholesterol can cause damage to the arteries and heart. What foods have cholesterol? Cholesterol is found in animal-based foods, such as meat, seafood, and dairy. Generally, low-fat dairy and lean meats have less cholesterol than full-fat dairy and fatty meats. The milligrams of cholesterol per serving (mg per serving) of common cholesterol-containing foods are listed below. Meats and other proteins Egg -- one large whole egg has 186 mg. Veal shank -- 4 oz (113 g) has 141 mg. Lean ground [place] (93% lean) -- 4 oz (113 g) has 118 mg. Fat-trimmed lamb loin -- 4 oz (113 g) has 106 mg. Lean ground beef (90% lean) -- 4 oz (113 g) has 100 mg. Lobster -- 3.5 oz (99 g) has 90 mg. Pork loin chops -- 4 oz (113 g) has 86 mg. Canned salmon -- 3.5 oz (99 g) has 83 mg. Fat-trimmed beef top loin -- 4 oz (113 g) has 78 mg. Frankfurter -- 1 frank (3.5 oz or 99 g) has 77 mg. Crab -- 3.5 oz (99 g) has 71 mg. Roasted chicken without skin, white meat -- 4 oz (113 g) has 66 mg. Light bologna -- 2 oz (57 g) has 45 mg. Deli-cut [place] -- 2 oz (57 g) has 31 mg. Canned tuna -- 3.5 oz (99 g) has 31 mg. Berniece Salines -- 1 oz (28 g) has 29 mg. Oysters and mussels (raw) -- 3.5 oz (99 g) has 25 mg. Mackerel -- 1 oz (28 g) has 22 mg. Trout -- 1 oz (28 g) has 20 mg. Pork sausage -- 1 link (1 oz or 28 g) has 17 mg. Salmon --  1 oz (28 g) has 16 mg. Tilapia -- 1 oz (28 g) has 14 mg. Dairy Soft-serve ice cream --  cup (4 oz or 86 g) has 103 mg. Whole-milk yogurt -- 1 cup (8 oz or 245 g) has 29 mg. Cheddar cheese -- 1 oz (28 g) has 28 mg. American cheese -- 1 oz (28 g) has 28 mg. Whole milk -- 1 cup (8 oz or 250 mL) has 23 mg. 2% milk -- 1 cup (8 oz or 250 mL) has 18 mg. Cream cheese -- 1 tablespoon (Tbsp) (14.5 g) has 15 mg. Cottage cheese --  cup (4 oz or 113 g) has 14 mg. Low-fat (1%) milk -- 1 cup (8 oz or 250 mL) has 10 mg. Sour cream -- 1 Tbsp (12 g) has 8.5 mg. Low-fat  yogurt -- 1 cup (8 oz or 245 g) has 8 mg. Nonfat Greek yogurt -- 1 cup (8 oz or 228 g) has 7 mg. Half-and-half cream -- 1 Tbsp (15 mL) has 5 mg. Fats and oils Cod liver oil -- 1 tablespoon (Tbsp) (13.6 g) has 82 mg. Butter -- 1 Tbsp (14 g) has 15 mg. Lard -- 1 Tbsp (12.8 g) has 14 mg. Bacon grease -- 1 Tbsp (12.9 g) has 14 mg. Mayonnaise -- 1 Tbsp (13.8 g) has 5-10 mg. Margarine -- 1 Tbsp (14 g) has 3-10 mg. The items listed above may not be a complete list of foods with cholesterol. Exact amounts of cholesterol in these foods may vary depending on specific ingredients and brands. Contact a dietitian for more information. What foods do not have cholesterol? Most plant-based foods do not have cholesterol unless you combine them with a food that has cholesterol. Foods without cholesterol include: Grains and cereals. Vegetables. Fruits. Vegetable oils, such as olive, canola, and sunflower oil. Legumes, such as peas, beans, and lentils. Nuts and seeds. Egg whites. The items listed above may not be a complete list of foods that do not have cholesterol. Contact a dietitian for more information. Summary The body needs cholesterol in small amounts, but too much cholesterol can cause damage to the arteries and heart. Cholesterol is found in animal-based foods, such as meat, seafood, and dairy. Generally, low-fat dairy and lean meats have less cholesterol than full-fat dairy and fatty meats. This information is not intended to replace advice given to you by your health care provider. Make sure you discuss any questions you have with your health care provider. Document Revised: 01/12/2021 Document Reviewed: 01/12/2021 Elsevier Patient Education  [place].

## 2021-11-13 DIAGNOSIS — E7800 Pure hypercholesterolemia, unspecified: Secondary | ICD-10-CM

## 2021-11-20 ENCOUNTER — Other Ambulatory Visit (INDEPENDENT_AMBULATORY_CARE_PROVIDER_SITE_OTHER): Payer: Medicare HMO

## 2021-11-20 DIAGNOSIS — R739 Hyperglycemia, unspecified: Secondary | ICD-10-CM | POA: Diagnosis not present

## 2021-11-20 DIAGNOSIS — E213 Hyperparathyroidism, unspecified: Secondary | ICD-10-CM | POA: Diagnosis not present

## 2021-11-20 DIAGNOSIS — E7800 Pure hypercholesterolemia, unspecified: Secondary | ICD-10-CM | POA: Diagnosis not present

## 2021-11-20 DIAGNOSIS — E538 Deficiency of other specified B group vitamins: Secondary | ICD-10-CM | POA: Diagnosis not present

## 2021-11-20 DIAGNOSIS — E559 Vitamin D deficiency, unspecified: Secondary | ICD-10-CM

## 2021-11-20 LAB — BASIC METABOLIC PANEL
BUN: 26 mg/dL — ABNORMAL HIGH (ref 6–23)
CO2: 30 mEq/L (ref 19–32)
Calcium: 9.5 mg/dL (ref 8.4–10.5)
Chloride: 104 mEq/L (ref 96–112)
Creatinine, Ser: 0.86 mg/dL (ref 0.40–1.20)
GFR: 63.62 mL/min (ref >60.00)
Glucose, Bld: 69 mg/dL — ABNORMAL LOW (ref 70–99)
Potassium: 3.7 mEq/L (ref 3.5–5.1)
Sodium: 141 mEq/L (ref 135–145)

## 2021-11-20 LAB — URINALYSIS, ROUTINE W REFLEX MICROSCOPIC
Bilirubin Urine: NEGATIVE
Ketones, ur: NEGATIVE
Leukocytes,Ua: NEGATIVE
Nitrite: NEGATIVE
Specific Gravity, Urine: <=1.005 — AB (ref 1.000–1.030)
Total Protein, Urine: NEGATIVE
Urine Glucose: NEGATIVE
Urobilinogen, UA: 0.2 (ref 0.0–1.0)
pH: 5.5 (ref 5.0–8.0)

## 2021-11-20 LAB — TSH: TSH: 1.07 u[IU]/mL (ref 0.35–5.50)

## 2021-11-20 LAB — LIPID PANEL
Cholesterol: 161 mg/dL (ref 0–200)
HDL: 77.3 mg/dL (ref >39.00)
LDL Cholesterol: 62 mg/dL (ref 0–99)
NonHDL: 83.29
Total CHOL/HDL Ratio: 2
Triglycerides: 106 mg/dL (ref 0.0–149.0)
VLDL: 21.2 mg/dL (ref 0.0–40.0)

## 2021-11-20 LAB — VITAMIN B12: Vitamin B-12: 406 pg/mL (ref 211–911)

## 2021-11-20 LAB — CBC WITH DIFFERENTIAL/PLATELET
Basophils Absolute: 0 10*3/uL (ref 0.0–0.1)
Basophils Relative: 0.4 % (ref 0.0–3.0)
Eosinophils Absolute: 0.2 10*3/uL (ref 0.0–0.7)
Eosinophils Relative: 4.3 % (ref 0.0–5.0)
HCT: 42.5 % (ref 36.0–46.0)
Hemoglobin: 14.3 g/dL (ref 12.0–15.0)
Lymphocytes Relative: 39.5 % (ref 12.0–46.0)
Lymphs Abs: 1.7 10*3/uL (ref 0.7–4.0)
MCHC: 33.6 g/dL (ref 30.0–36.0)
MCV: 83.4 fl (ref 78.0–100.0)
Monocytes Absolute: 0.5 10*3/uL (ref 0.1–1.0)
Monocytes Relative: 12.7 % — ABNORMAL HIGH (ref 3.0–12.0)
Neutro Abs: 1.8 10*3/uL (ref 1.4–7.7)
Neutrophils Relative %: 43.1 % (ref 43.0–77.0)
Platelets: 176 10*3/uL (ref 150.0–400.0)
RBC: 5.09 Mil/uL (ref 3.87–5.11)
RDW: 13.7 % (ref 11.5–15.5)
WBC: 4.2 10*3/uL (ref 4.0–10.5)

## 2021-11-20 LAB — HEPATIC FUNCTION PANEL
ALT: 15 U/L (ref 0–35)
AST: 16 U/L (ref 0–37)
Albumin: 4.1 g/dL (ref 3.5–5.2)
Alkaline Phosphatase: 53 U/L (ref 39–117)
Bilirubin, Direct: 0.1 mg/dL (ref 0.0–0.3)
Total Bilirubin: 0.6 mg/dL (ref 0.2–1.2)
Total Protein: 6.8 g/dL (ref 6.0–8.3)

## 2021-11-20 LAB — HEMOGLOBIN A1C: Hgb A1c MFr Bld: 5.4 % (ref 4.6–6.5)

## 2021-11-20 LAB — VITAMIN D 25 HYDROXY (VIT D DEFICIENCY, FRACTURES): VITD: 57.29 ng/mL (ref 30.00–100.00)

## 2021-11-22 ENCOUNTER — Encounter: Payer: Self-pay | Admitting: Internal Medicine

## 2021-11-22 ENCOUNTER — Other Ambulatory Visit: Payer: Self-pay

## 2021-11-22 ENCOUNTER — Ambulatory Visit (INDEPENDENT_AMBULATORY_CARE_PROVIDER_SITE_OTHER): Payer: Medicare HMO

## 2021-11-22 ENCOUNTER — Ambulatory Visit (INDEPENDENT_AMBULATORY_CARE_PROVIDER_SITE_OTHER): Payer: Medicare HMO | Admitting: Internal Medicine

## 2021-11-22 VITALS — BP 118/70 | HR 61 | Temp 97.7°F | Ht 65.0 in | Wt 155.0 lb

## 2021-11-22 DIAGNOSIS — Z0001 Encounter for general adult medical examination with abnormal findings: Secondary | ICD-10-CM

## 2021-11-22 DIAGNOSIS — E7800 Pure hypercholesterolemia, unspecified: Secondary | ICD-10-CM

## 2021-11-22 DIAGNOSIS — R69 Illness, unspecified: Secondary | ICD-10-CM | POA: Diagnosis not present

## 2021-11-22 DIAGNOSIS — H9312 Tinnitus, left ear: Secondary | ICD-10-CM

## 2021-11-22 DIAGNOSIS — E210 Primary hyperparathyroidism: Secondary | ICD-10-CM | POA: Diagnosis not present

## 2021-11-22 DIAGNOSIS — Z0000 Encounter for general adult medical examination without abnormal findings: Secondary | ICD-10-CM | POA: Diagnosis not present

## 2021-11-22 DIAGNOSIS — E559 Vitamin D deficiency, unspecified: Secondary | ICD-10-CM

## 2021-11-22 DIAGNOSIS — F32A Depression, unspecified: Secondary | ICD-10-CM

## 2021-11-22 DIAGNOSIS — R739 Hyperglycemia, unspecified: Secondary | ICD-10-CM | POA: Diagnosis not present

## 2021-11-22 LAB — PTH, INTACT AND CALCIUM
Calcium: 9.3 mg/dL (ref 8.6–10.4)
PTH: 21 pg/mL (ref 16–77)

## 2021-11-22 NOTE — Patient Instructions (Signed)
Robyn Bailey , Thank you for taking time to come for your Medicare Wellness Visit. I appreciate your ongoing commitment to your health goals. Please review the following plan we discussed and let me know if I can assist you in the future.   Screening recommendations/referrals: Colonoscopy: discontinued Mammogram: discontinued Bone Density: 11/17/2017; due every 2-3 years Recommended yearly ophthalmology/optometry visit for glaucoma screening and checkup Recommended yearly dental visit for hygiene and checkup  Vaccinations: Influenza vaccine: 05/25/2021 Pneumococcal vaccine: 05/17/2006, 10/20/2013 Tdap vaccine: 04/20/2009; due every 10 years (overdue) Shingles vaccine: 10/25/2020, 02/15/2021   Covid-19: 11/08/2019, 12/01/2019, 06/23/2020  Advanced directives: Please bring a copy of your health care power of attorney and living will to the office at your convenience.  Conditions/risks identified: Yes; Client understands the importance of follow-up appointments with providers by attending scheduled visits and discussed goals to eat healthier, increase physical activity 5 times a week for 30 minutes each, exercise the brain by doing stimulating brain exercises (reading, adult coloring, crafting, listening to music, puzzles, etc.), socialize and enjoy life more, get enough sleep at least 8-9 hours average per night and make time for laughter.   Next appointment: Please schedule your next Medicare Wellness Visit with your Nurse Health Advisor in 1 year by calling [phone].   Preventive Care 81 Years and Older, Female Preventive care refers to lifestyle choices and visits with your health care provider that can promote health and wellness. What does preventive care include? A yearly physical exam. This is also called an annual well check. Dental exams once or twice a year. Routine eye exams. Ask your health care provider how often you should have your eyes checked. Personal lifestyle choices,  including: Daily care of your teeth and gums. Regular physical activity. Eating a healthy diet. Avoiding tobacco and drug use. Limiting alcohol use. Practicing safe sex. Taking low-dose aspirin every day. Taking vitamin and mineral supplements as recommended by your health care provider. What happens during an annual well check? The services and screenings done by your health care provider during your annual well check will depend on your age, overall health, lifestyle risk factors, and family history of disease. Counseling  Your health care provider may ask you questions about your: Alcohol use. Tobacco use. Drug use. Emotional well-being. Home and relationship well-being. Sexual activity. Eating habits. History of falls. Memory and ability to understand (cognition). Work and work Statistician. Reproductive health. Screening  You may have the following tests or measurements: Height, weight, and BMI. Blood pressure. Lipid and cholesterol levels. These may be checked every 5 years, or more frequently if you are over 76 years old. Skin check. Lung cancer screening. You may have this screening every year starting at age 46 if you have a 30-pack-year history of smoking and currently smoke or have quit within the past 15 years. Fecal occult blood test (FOBT) of the stool. You may have this test every year starting at age 55. Flexible sigmoidoscopy or colonoscopy. You may have a sigmoidoscopy every 5 years or a colonoscopy every 10 years starting at age 33. Hepatitis C blood test. Hepatitis B blood test. Sexually transmitted disease (STD) testing. Diabetes screening. This is done by checking your blood sugar (glucose) after you have not eaten for a while (fasting). You may have this done every 1-3 years. Bone density scan. This is done to screen for osteoporosis. You may have this done starting at age 21. Mammogram. This may be done every 1-2 years. Talk to your health care provider  about how often you should have regular mammograms. Talk with your health care provider about your test results, treatment options, and if necessary, the need for more tests. Vaccines  Your health care provider may recommend certain vaccines, such as: Influenza vaccine. This is recommended every year. Tetanus, diphtheria, and acellular pertussis (Tdap, Td) vaccine. You may need a Td booster every 10 years. Zoster vaccine. You may need this after age 50. Pneumococcal 13-valent conjugate (PCV13) vaccine. One dose is recommended after age 66. Pneumococcal polysaccharide (PPSV23) vaccine. One dose is recommended after age 65. Talk to your health care provider about which screenings and vaccines you need and how often you need them. This information is not intended to replace advice given to you by your health care provider. Make sure you discuss any questions you have with your health care provider. Document Released: 09/29/2015 Document Revised: 05/22/2016 Document Reviewed: 07/04/2015 Elsevier Interactive Patient Education  2017 [employer] Prevention in the Home Falls can cause injuries. They can happen to people of all ages. There are many things you can do to make your home safe and to help prevent falls. What can I do on the outside of my home? Regularly fix the edges of walkways and driveways and fix any cracks. Remove anything that might make you trip as you walk through a door, such as a raised step or threshold. Trim any bushes or trees on the path to your home. Use bright outdoor lighting. Clear any walking paths of anything that might make someone trip, such as rocks or tools. Regularly check to see if handrails are loose or broken. Make sure that both sides of any steps have handrails. Any raised decks and porches should have guardrails on the edges. Have any leaves, snow, or ice cleared regularly. Use sand or salt on walking paths during winter. Clean up any spills in  your garage right away. This includes oil or grease spills. What can I do in the bathroom? Use night lights. Install grab bars by the toilet and in the tub and shower. Do not use towel bars as grab bars. Use non-skid mats or decals in the tub or shower. If you need to sit down in the shower, use a plastic, non-slip stool. Keep the floor dry. Clean up any water that spills on the floor as soon as it happens. Remove soap buildup in the tub or shower regularly. Attach bath mats securely with double-sided non-slip rug tape. Do not have throw rugs and other things on the floor that can make you trip. What can I do in the bedroom? Use night lights. Make sure that you have a light by your bed that is easy to reach. Do not use any sheets or blankets that are too big for your bed. They should not hang down onto the floor. Have a firm chair that has side arms. You can use this for support while you get dressed. Do not have throw rugs and other things on the floor that can make you trip. What can I do in the kitchen? Clean up any spills right away. Avoid walking on wet floors. Keep items that you use a lot in easy-to-reach places. If you need to reach something above you, use a strong step stool that has a grab bar. Keep electrical cords out of the way. Do not use floor polish or wax that makes floors slippery. If you must use wax, use non-skid floor wax. Do not have throw rugs and other  things on the floor that can make you trip. What can I do with my stairs? Do not leave any items on the stairs. Make sure that there are handrails on both sides of the stairs and use them. Fix handrails that are broken or loose. Make sure that handrails are as long as the stairways. Check any carpeting to make sure that it is firmly attached to the stairs. Fix any carpet that is loose or worn. Avoid having throw rugs at the top or bottom of the stairs. If you do have throw rugs, attach them to the floor with carpet  tape. Make sure that you have a light switch at the top of the stairs and the bottom of the stairs. If you do not have them, ask someone to add them for you. What else can I do to help prevent falls? Wear shoes that: Do not have high heels. Have rubber bottoms. Are comfortable and fit you well. Are closed at the toe. Do not wear sandals. If you use a stepladder: Make sure that it is fully opened. Do not climb a closed stepladder. Make sure that both sides of the stepladder are locked into place. Ask someone to hold it for you, if possible. Clearly mark and make sure that you can see: Any grab bars or handrails. First and last steps. Where the edge of each step is. Use tools that help you move around (mobility aids) if they are needed. These include: Canes. Walkers. Scooters. Crutches. Turn on the lights when you go into a dark area. Replace any light bulbs as soon as they burn out. Set up your furniture so you have a clear path. Avoid moving your furniture around. If any of your floors are uneven, fix them. If there are any pets around you, be aware of where they are. Review your medicines with your doctor. Some medicines can make you feel dizzy. This can increase your chance of falling. Ask your doctor what other things that you can do to help prevent falls. This information is not intended to replace advice given to you by your health care provider. Make sure you discuss any questions you have with your health care provider. Document Released: 06/29/2009 Document Revised: 02/08/2016 Document Reviewed: 10/07/2014 Elsevier Interactive Patient Education  2017 [employer].

## 2021-11-22 NOTE — Assessment & Plan Note (Signed)
Worsening uncontrolled to both ears now - d/w pt, no specific tx available, delcines ENT referral or MRI ?

## 2021-11-22 NOTE — Progress Notes (Signed)
Patient ID: Robyn Bailey, female   DOB: 17-May-1941, 81 y.o.   MRN: [id number]         Chief Complaint:: wellness exam and Follow-up  Tinnitus, hyperparathyroidism, hypercalcemia, vit d deficiency, hld, depression       HPI:  Robyn Bailey is a 81 y.o. female here for wellness exam; declines covid booster, tdap, o/w up to date                        Also plans to go to [employer] for new hearing aid soon, has bilateral tinnitus just not getting better, still frustrated .  Does have several wks ongoing nasal allergy symptoms with clearish congestion, itch and sneezing, without fever, pain, ST, cough, swelling or wheezing.  Pt denies chest pain, increased sob or doe, wheezing, orthopnea, PND, increased LE swelling, palpitations, dizziness or syncope.   Pt denies fever, wt loss, night sweats, loss of appetite, or other constitutional symptoms     Wt Readings from Last 3 Encounters:  11/22/21 155 lb (70.3 kg)  11/22/21 155 lb (70.3 kg)  05/25/21 147 lb 3.2 oz (66.8 kg)   BP Readings from Last 3 Encounters:  11/22/21 118/70  11/22/21 118/70  05/25/21 122/70   Immunization History  Administered Date(s) Administered   Fluad Quad(high Dose 65+) 07/22/2019, 06/16/2020, 05/25/2021   PFIZER Comirnaty(Gray Top)Covid-19 Tri-Sucrose Vaccine 11/08/2019, 12/01/2019, 06/23/2020   Pneumococcal Conjugate-13 10/20/2013   Pneumococcal Polysaccharide-23 05/17/2006   Td 04/20/2009   Zoster Recombinat (Shingrix) 10/25/2020, 02/15/2021   There are no preventive care reminders to display for this patient.     Past Medical History:  Diagnosis Date   ABNORMAL ELECTROCARDIOGRAM 07/19/2007   ALLERGIC RHINITIS 07/19/2007   Anatomical narrow angle glaucoma 04/20/2009   ANXIETY 07/19/2007   BACK PAIN 11/08/2009   Blepharospasm 06/06/2008   COLONIC POLYPS, HX OF 07/19/2007   Cough 09/22/2007   DEPRESSION 07/19/2007   DIVERTICULOSIS, COLON 07/19/2007   Dyspnea    exertion   FATIGUE 04/20/2009   GERD (gastroesophageal  reflux disease)    HYPERLIPIDEMIA 07/19/2007   Left sided sciatica 01/22/2011   LOW BACK PAIN 07/19/2007   OSTEOARTHRITIS 07/19/2007   OSTEOPOROSIS 07/19/2007   PERIPHERAL NEUROPATHY 06/08/2007   Ptosis of eyelid, bilateral    RHINITIS 02/27/2009   SINUSITIS- ACUTE-NOS 11/04/2007   SORE THROAT 02/27/2009   Past Surgical History:  Procedure Laterality Date   BREAST BIOPSY     CATARACT EXTRACTION, BILATERAL     COLONOSCOPY     EYE SURGERY     cataract   hammer toe surgury  2003   bilateral   PTOSIS REPAIR Bilateral 10/12/2018   Procedure: Bilateral ptosis eyelid correction external approach suture technique;  Surgeon: Irene Limbo, MD;  Location: [facility];  Service: Plastics;  Laterality: Bilateral;   TONSILLECTOMY      reports that she has quit smoking. She has never used smokeless tobacco. She reports current alcohol use. She reports that she does not use drugs. family history includes Colon cancer in her maternal uncle and mother. No Known Allergies Current Outpatient Medications on File Prior to Visit  Medication Sig Dispense Refill   CALCIUM PO Take 650 mg by mouth.     ketoconazole (NIZORAL) 2 % shampoo Apply 1 application topically once a week.     meloxicam (MOBIC) 15 MG tablet TAKE ONE TABLET BY MOUTH DAILY (Patient taking differently: Take 15 mg by mouth daily.) 90 tablet 3   MINOXIDIL, TOPICAL,  5 % SOLN Apply 1 application topically daily. Hair     pantoprazole (PROTONIX) 40 MG tablet TAKE ONE TABLET BY MOUTH TWICE A DAY 180 tablet 3   potassium gluconate 595 (99 K) MG TABS tablet Take 595 mg by mouth 3 (three) times a week.     Probiotic Product (ALIGN) 4 MG CAPS Take 4 mg by mouth See admin instructions. Every other month     rosuvastatin (CRESTOR) 10 MG tablet Take 1 tablet (10 mg total) by mouth daily. 90 tablet 3   tizanidine (ZANAFLEX) 2 MG capsule TAKE ONE CAPSULE BY MOUTH THREE TIMES A DAY 40 capsule 2   triamcinolone (NASACORT AQ) 55 MCG/ACT AERO  nasal inhaler Place 2 sprays into the nose daily. (Patient taking differently: Place 2 sprays into the nose daily as needed (congestion).) 1 Inhaler 12   Vitamin D3 (VITAMIN D) 25 MCG tablet Take 1,000 Units by mouth daily.     hydrochlorothiazide (MICROZIDE) 12.5 MG capsule Take 1 capsule (12.5 mg total) by mouth daily. 30 capsule 11   No current facility-administered medications on file prior to visit.        ROS:  All others reviewed and negative.  Objective        PE:  BP 118/70 (BP Location: Right Arm, Patient Position: Sitting, Cuff Size: Normal)    Pulse 61    Temp 97.7 F (36.5 C) (Oral)    Ht '5\' 5"'$  (1.651 m)    Wt 155 lb (70.3 kg)    SpO2 99%    BMI 25.79 kg/m                 Constitutional: Pt appears in NAD               HENT: Head: NCAT.                Right Ear: External ear normal.                 Left Ear: External ear normal.                Eyes: . Pupils are equal, round, and reactive to light. Conjunctivae and EOM are normal               Nose: without d/c or deformity               Neck: Neck supple. Gross normal ROM               Cardiovascular: Normal rate and regular rhythm.                 Pulmonary/Chest: Effort normal and breath sounds without rales or wheezing.                Abd:  Soft, NT, ND, + BS, no organomegaly               Neurological: Pt is alert. At baseline orientation, motor grossly intact               Skin: Skin is warm. No rashes, no other new lesions, LE edema - none               Psychiatric: Pt behavior is normal without agitation but 2+ nervous  Micro: none  Cardiac tracings I have personally interpreted today:  none  Pertinent Radiological findings (summarize): none   Lab Results  Component Value Date   WBC 4.2 11/20/2021   HGB 14.3 11/20/2021  HCT 42.5 11/20/2021   PLT 176.0 11/20/2021   GLUCOSE 69 (L) 11/20/2021   CHOL 161 11/20/2021   TRIG 106.0 11/20/2021   HDL 77.30 11/20/2021   LDLDIRECT 134.0 10/20/2013   LDLCALC 62  11/20/2021   ALT 15 11/20/2021   AST 16 11/20/2021   NA 141 11/20/2021   K 3.7 11/20/2021   CL 104 11/20/2021   CREATININE 0.86 11/20/2021   BUN 26 (H) 11/20/2021   CO2 30 11/20/2021   TSH 1.07 11/20/2021   HGBA1C 5.4 11/20/2021   Assessment/Plan:  Robyn Bailey is a 81 y.o. White or Caucasian [1] female with  has a past medical history of ABNORMAL ELECTROCARDIOGRAM (07/19/2007), ALLERGIC RHINITIS (07/19/2007), Anatomical narrow angle glaucoma (04/20/2009), ANXIETY (07/19/2007), BACK PAIN (11/08/2009), Blepharospasm (06/06/2008), COLONIC POLYPS, HX OF (07/19/2007), Cough (09/22/2007), DEPRESSION (07/19/2007), DIVERTICULOSIS, COLON (07/19/2007), Dyspnea, FATIGUE (04/20/2009), GERD (gastroesophageal reflux disease), HYPERLIPIDEMIA (07/19/2007), Left sided sciatica (01/22/2011), LOW BACK PAIN (07/19/2007), OSTEOARTHRITIS (07/19/2007), OSTEOPOROSIS (07/19/2007), PERIPHERAL NEUROPATHY (06/08/2007), Ptosis of eyelid, bilateral, RHINITIS (02/27/2009), SINUSITIS- ACUTE-NOS (11/04/2007), and SORE THROAT (02/27/2009).  Hyperparathyroidism, primary ([place]) Lab pending f/u PTH and calcium levels  Tinnitus of left ear Worsening uncontrolled to both ears now - d/w pt, no specific tx available, delcines ENT referral or MRI  Encounter for well adult exam with abnormal findings Age and sex appropriate education and counseling updated with regular exercise and diet Referrals for preventative services - none needed Immunizations addressed - declines covid booster, tdap Smoking counseling  - none needed Evidence for depression or other mood disorder - chronic anxiety Most recent labs reviewed. I have personally reviewed and have noted: 1) the patient's medical and social history 2) The patient's current medications and supplements 3) The patient's height, weight, and BMI have been recorded in the chart   Vitamin D insufficiency Last vitamin D Lab Results  Component Value Date   VD25OH 57.29 11/20/2021   Stable, cont oral  replacement   HLD (hyperlipidemia) Lab Results  Component Value Date   LDLCALC 62 11/20/2021   Stable, pt to continue current statin crestor 10   Depression Stable, delcines need for change in tx at this time  Hypercalcemia Stable, s/p parathryoidectomy  Followup: Return in about 1 year (around 11/23/2022).  Cathlean Cower, MD 11/24/2021 8:26 PM [facility]

## 2021-11-22 NOTE — Patient Instructions (Addendum)
Please continue all other medications as before, and refills have been done if requested. ? ?Please have the pharmacy call with any other refills you may need. ? ?Please continue your efforts at being more active, low cholesterol diet, and weight control. ? ?You are otherwise up to date with prevention measures today. ? ?Please keep your appointments with your specialists as you may have planned ? ?Please make an Appointment to return for your 1 year visit, or sooner if needed, with Lab testing by Appointment as well, to be done about 3-5 days before at the [facility] (so this is for TWO appointments - please see the scheduling desk as you leave) ? ? ?Due to the ongoing Covid 19 pandemic, our lab now requires an appointment for any labs done at our office.  If you need labs done and do not have an appointment, please call our office ahead of time to schedule before presenting to the lab for your testing. ? ?

## 2021-11-22 NOTE — Assessment & Plan Note (Signed)
Lab pending f/u PTH and calcium levels ?

## 2021-11-22 NOTE — Progress Notes (Signed)
Subjective:   Robyn Bailey is a 81 y.o. female who presents for Medicare Annual (Subsequent) preventive examination.  Review of Systems     Cardiac Risk Factors include: advanced age (>74mn, >>47women);dyslipidemia     Objective:    Today's Vitals   11/22/21 1001  BP: 118/70  Pulse: 61  Temp: 97.7 F (36.5 C)  SpO2: 99%  Weight: 155 lb (70.3 kg)  Height: '5\' 5"'$  (1.651 m)  PainSc: 0-No pain   Body mass index is 25.79 kg/m.  Advanced Directives 11/22/2021 02/02/2021 09/26/2020 11/12/2018 10/02/2018 05/28/2018 11/11/2017  Does Patient Have a Medical Advance Directive? Yes Yes Yes Yes Yes Yes Yes  Type of Advance Directive Living will;Healthcare Power of [place] will Living will [place] will [place] will [place] will  Does patient want to make changes to medical advance directive? No - Patient declined - No - Patient declined - No - Patient declined - -  Copy of [place] Chart? No - copy requested - - No - copy requested - - No - copy requested    Current Medications (verified) Outpatient Encounter Medications as of 11/22/2021  Medication Sig   CALCIUM PO Take 650 mg by mouth.   hydrochlorothiazide (MICROZIDE) 12.5 MG capsule Take 1 capsule (12.5 mg total) by mouth daily.   ketoconazole (NIZORAL) 2 % shampoo Apply 1 application topically once a week.   meloxicam (MOBIC) 15 MG tablet TAKE ONE TABLET BY MOUTH DAILY (Patient taking differently: Take 15 mg by mouth daily.)   MINOXIDIL, TOPICAL, 5 % SOLN Apply 1 application topically daily. Hair   pantoprazole (PROTONIX) 40 MG tablet TAKE ONE TABLET BY MOUTH TWICE A DAY   potassium gluconate 595 (99 K) MG TABS tablet Take 595 mg by mouth 3 (three) times a week.   Probiotic Product (ALIGN) 4 MG CAPS Take 4 mg by mouth See admin instructions. Every other  month   rosuvastatin (CRESTOR) 10 MG tablet Take 1 tablet (10 mg total) by mouth daily.   tizanidine (ZANAFLEX) 2 MG capsule TAKE ONE CAPSULE BY MOUTH THREE TIMES A DAY   triamcinolone (NASACORT AQ) 55 MCG/ACT AERO nasal inhaler Place 2 sprays into the nose daily. (Patient taking differently: Place 2 sprays into the nose daily as needed (congestion).)   Vitamin D3 (VITAMIN D) 25 MCG tablet Take 1,000 Units by mouth daily.   No facility-administered encounter medications on file as of 11/22/2021.    Allergies (verified) Patient has no known allergies.   History: Past Medical History:  Diagnosis Date   ABNORMAL ELECTROCARDIOGRAM 07/19/2007   ALLERGIC RHINITIS 07/19/2007   Anatomical narrow angle glaucoma 04/20/2009   ANXIETY 07/19/2007   BACK PAIN 11/08/2009   Blepharospasm 06/06/2008   COLONIC POLYPS, HX OF 07/19/2007   Cough 09/22/2007   DEPRESSION 07/19/2007   DIVERTICULOSIS, COLON 07/19/2007   Dyspnea    exertion   FATIGUE 04/20/2009   GERD (gastroesophageal reflux disease)    HYPERLIPIDEMIA 07/19/2007   Left sided sciatica 01/22/2011   LOW BACK PAIN 07/19/2007   OSTEOARTHRITIS 07/19/2007   OSTEOPOROSIS 07/19/2007   PERIPHERAL NEUROPATHY 06/08/2007   Ptosis of eyelid, bilateral    RHINITIS 02/27/2009   SINUSITIS- ACUTE-NOS 11/04/2007   SORE THROAT 02/27/2009   Past Surgical History:  Procedure Laterality Date   BREAST BIOPSY     CATARACT EXTRACTION, BILATERAL     COLONOSCOPY  EYE SURGERY     cataract   hammer toe surgury  2003   bilateral   PTOSIS REPAIR Bilateral 10/12/2018   Procedure: Bilateral ptosis eyelid correction external approach suture technique;  Surgeon: Irene Limbo, MD;  Location: [facility];  Service: Plastics;  Laterality: Bilateral;   TONSILLECTOMY     Family History  Problem Relation Age of Onset   Colon cancer Mother    Colon cancer Maternal Uncle    Esophageal cancer Neg Hx    Rectal cancer Neg Hx    Stomach cancer Neg Hx    Social  History   Socioeconomic History   Marital status: Single    Spouse name: Not on file   Number of children: Not on file   Years of education: Not on file   Highest education level: Not on file  Occupational History   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   Occupation: part time research legal  Tobacco Use   Smoking status: Former   Smokeless tobacco: Never  Scientific laboratory technician Use: Never used  Substance and Sexual Activity   Alcohol use: Yes    Alcohol/week: 0.0 standard drinks    Comment: occasional glass of wine; rare now b/c acid reflux   Drug use: No   Sexual activity: Not Currently    Birth control/protection: Post-menopausal  Other Topics Concern   Not on file  Social History Narrative   Not on file   Social Determinants of Health   Financial Resource Strain: Low Risk    Difficulty of Paying Living Expenses: Not hard at all  Food Insecurity: No Food Insecurity   Worried About Charity fundraiser in the Last Year: Never true   [place] in the Last Year: Never true  Transportation Needs: No Transportation Needs   Lack of Transportation (Medical): No   Lack of Transportation (Non-Medical): No  Physical Activity: Inactive   Days of Exercise per Week: 0 days   Minutes of Exercise per Session: 0 min  Stress: No Stress Concern Present   Feeling of Stress : Not at all  Social Connections: Moderately Integrated   Frequency of Communication with Friends and Family: More than three times a week   Frequency of Social Gatherings with Friends and Family: More than three times a week   Attends Religious Services: More than 4 times per year   Active Member of [employer] or Organizations: Yes   Attends Music therapist: More than 4 times per year   Marital Status: Never married    Tobacco Counseling Counseling given: Not Answered   Clinical Intake:  Pre-visit preparation completed: Yes  Pain : No/denies pain Pain Score: 0-No  pain     BMI - recorded: 25.79 Nutritional Status: BMI 25 -29 Overweight Nutritional Risks: None Diabetes: No  How often do you need to have someone help you when you read instructions, pamphlets, or other written materials from your doctor or pharmacy?: 1 - Never What is the last grade level you completed in school?: Master's Degree  Diabetic? no  Interpreter Needed?: No  Information entered by :: Lisette Abu, LPN   Activities of Daily Living In your present state of health, do you have any difficulty performing the following activities: 11/22/2021 02/02/2021  Hearing? N N  Vision? N N  Difficulty concentrating or making decisions? N N  Walking or climbing stairs? N N  Dressing or bathing? N N  Doing errands, shopping?  N N  Preparing Food and eating ? N -  Using the Toilet? N -  In the past six months, have you accidently leaked urine? N -  Do you have problems with loss of bowel control? N -  Managing your Medications? N -  Managing your Finances? N -  Housekeeping or managing your Housekeeping? N -  Some recent data might be hidden    Patient Care Team: Biagio Borg, MD as PCP - General Irene Limbo, MD as Consulting Physician (Plastic Surgery) Katy Apo, MD as Consulting Physician (Ophthalmology) Knox Royalty, RN as Case Manager  Indicate any recent Medical Services you may have received from other than [facility] providers in the past year (date may be approximate).     Assessment:   This is a routine wellness examination for Robyn Bailey.  Hearing/Vision screen Hearing Screening - Comments:: Patient has issues with tinnitus. No hearing aids. Vision Screening - Comments:: Patient wears eyeglasses. Annual eye exam done by: Dr. Katy Apo  Dietary issues and exercise activities discussed: Current Exercise Habits: The patient does not participate in regular exercise at present, Exercise limited by: None identified   Goals Addressed                This Visit's Progress     Client understands the importance of follow-up with providers by attending scheduled visits (pt-stated)        My goal is to get back in the gym this month and try to lose at least 10 pounds.      Depression Screen PHQ 2/9 Scores 11/22/2021 11/22/2021 11/07/2021 06/05/2021 05/25/2021 11/17/2020 09/26/2020  PHQ - 2 Score 0 0 0 0 1 0 0  PHQ- 9 Score - - - - - - -    Fall Risk Fall Risk  11/22/2021 11/22/2021 11/22/2021 11/07/2021 05/25/2021  Falls in the past year? 0 0 '1 1 1  '$ Comment - - - Denies new/ recent falls since last outreach, 9/20- last fall August 2022 (only fall reported "ever") -  Number falls in past yr: 0 0 0 0 0  Injury with Fall? 0 0 0 0 1  Risk for fall due to : No Fall Risks - - History of fall(s) -  Risk for fall due to: Comment - - - - -  Follow up Falls evaluation completed - - Falls prevention discussed -  Comment - - - does not use assistive devices -    FALL RISK PREVENTION PERTAINING TO THE HOME:  Any stairs in or around the home? Yes  If so, are there any without handrails? No  Home free of loose throw rugs in walkways, pet beds, electrical cords, etc? Yes  Adequate lighting in your home to reduce risk of falls? Yes   ASSISTIVE DEVICES UTILIZED TO PREVENT FALLS:  Life alert? No  Use of a cane, walker or w/c? No  Grab bars in the bathroom? Yes  Shower chair or bench in shower? Yes  Elevated toilet seat or a handicapped toilet? Yes   TIMED UP AND GO:  Was the test performed? Yes .  Length of time to ambulate 10 feet: 6 sec.   Gait steady and fast without use of assistive device  Cognitive Function: Normal cognitive status assessed by direct observation by this Nurse Health Advisor. No abnormalities found.   MMSE - Mini Mental State Exam 11/11/2017  Orientation to time 5  Orientation to Place 5  Registration 3  Attention/ Calculation 5  Recall 3  Language- name 2 objects 2  Language- repeat 1  Language- follow 3 step command 3   Language- read & follow direction 1  Write a sentence 1  Copy design 1  Total score 30        Immunizations Immunization History  Administered Date(s) Administered   Fluad Quad(high Dose 65+) 07/22/2019, 06/16/2020, 05/25/2021   PFIZER Comirnaty(Gray Top)Covid-19 Tri-Sucrose Vaccine 11/08/2019, 12/01/2019, 06/23/2020   Pneumococcal Conjugate-13 10/20/2013   Pneumococcal Polysaccharide-23 05/17/2006   Td 04/20/2009   Zoster Recombinat (Shingrix) 10/25/2020, 02/15/2021    TDAP status: Due, Education has been provided regarding the importance of this vaccine. Advised may receive this vaccine at local pharmacy or Health Dept. Aware to provide a copy of the vaccination record if obtained from local pharmacy or Health Dept. Verbalized acceptance and understanding.  Flu Vaccine status: Up to date  Pneumococcal vaccine status: Up to date  Covid-19 vaccine status: Completed vaccines  Qualifies for Shingles Vaccine? Yes   Zostavax completed No   Shingrix Completed?: Yes  Screening Tests Health Maintenance  Topic Date Due   COVID-19 Vaccine (4 - Booster for Pfizer series) 12/08/2021 (Originally 08/18/2020)   TETANUS/TDAP  11/23/2022 (Originally 04/21/2019)   Pneumonia Vaccine 47+ Years old  Completed   INFLUENZA VACCINE  Completed   DEXA SCAN  Completed   Zoster Vaccines- Shingrix  Completed   HPV VACCINES  Aged Out   COLONOSCOPY (Pts 45-66yr Insurance coverage will need to be confirmed)  Discontinued    Health Maintenance  There are no preventive care reminders to display for this patient.  Colorectal cancer screening: No longer required.   Mammogram status: No longer required due to age/patient.  Bone Density status: Completed 11/17/2017. Results reflect: Bone density results: OSTEOPOROSIS. Repeat every 3 years.  Lung Cancer Screening: (Low Dose CT Chest recommended if Age 81-80years, 30 pack-year currently smoking OR have quit w/in 15years.) does not qualify.   Lung  Cancer Screening Referral: no  Additional Screening:  Hepatitis C Screening: does not qualify; Completed no  Vision Screening: Recommended annual ophthalmology exams for early detection of glaucoma and other disorders of the eye. Is the patient up to date with their annual eye exam?  Yes  Who is the provider or what is the name of the office in which the patient attends annual eye exams? GKaty Apo MD. If pt is not established with a provider, would they like to be referred to a provider to establish care? No .   Dental Screening: Recommended annual dental exams for proper oral hygiene  Community Resource Referral / Chronic Care Management: CRR required this visit?  No   CCM required this visit?  No      Plan:     I have personally reviewed and noted the following in the patients chart:   Medical and social history Use of alcohol, tobacco or illicit drugs  Current medications and supplements including opioid prescriptions.  Functional ability and status Nutritional status Physical activity Advanced directives List of other physicians Hospitalizations, surgeries, and ER visits in previous 12 months Vitals Screenings to include cognitive, depression, and falls Referrals and appointments  In addition, I have reviewed and discussed with patient certain preventive protocols, quality metrics, and best practice recommendations. A written personalized care plan for preventive services as well as general preventive health recommendations were provided to patient.     SSheral Flow LPN   32/12/4008  Nurse Notes:  Hearing Screening - Comments:: Patient has issues with tinnitus.  No hearing aids. Vision Screening - Comments:: Patient wears eyeglasses. Annual eye exam done by: Dr. Katy Apo

## 2021-11-23 ENCOUNTER — Telehealth: Payer: Self-pay | Admitting: Internal Medicine

## 2021-11-23 NOTE — Telephone Encounter (Signed)
Pt requesting an order for labs prior to cpe appt on 11-27-2022 ? ?Advised pt of possible out od pocket cost ?

## 2021-11-24 ENCOUNTER — Encounter: Payer: Self-pay | Admitting: Internal Medicine

## 2021-11-24 NOTE — Assessment & Plan Note (Signed)
Age and sex appropriate education and counseling updated with regular exercise and diet ?Referrals for preventative services - none needed ?Immunizations addressed - declines covid booster, tdap ?Smoking counseling  - none needed ?Evidence for depression or other mood disorder - chronic anxiety ?Most recent labs reviewed. ?I have personally reviewed and have noted: ?1) the patient's medical and social history ?2) The patient's current medications and supplements ?3) The patient's height, weight, and BMI have been recorded in the chart ? ?

## 2021-11-24 NOTE — Assessment & Plan Note (Signed)
Lab Results  ?Component Value Date  ? [place] 62 11/20/2021  ? ?Stable, pt to continue current statin crestor 10 ? ?

## 2021-11-24 NOTE — Assessment & Plan Note (Signed)
Last vitamin D Lab Results  Component Value Date   VD25OH 57.29 11/20/2021   Stable, cont oral replacement  

## 2021-11-24 NOTE — Assessment & Plan Note (Signed)
Stable, s/p parathryoidectomy ?

## 2021-11-24 NOTE — Assessment & Plan Note (Signed)
Stable, delcines need for change in tx at this time ?

## 2021-12-31 DIAGNOSIS — Z961 Presence of intraocular lens: Secondary | ICD-10-CM | POA: Diagnosis not present

## 2021-12-31 DIAGNOSIS — H52203 Unspecified astigmatism, bilateral: Secondary | ICD-10-CM | POA: Diagnosis not present

## 2021-12-31 DIAGNOSIS — H524 Presbyopia: Secondary | ICD-10-CM | POA: Diagnosis not present

## 2022-03-20 ENCOUNTER — Ambulatory Visit (INDEPENDENT_AMBULATORY_CARE_PROVIDER_SITE_OTHER): Payer: Medicare HMO | Admitting: *Deleted

## 2022-03-20 DIAGNOSIS — E7800 Pure hypercholesterolemia, unspecified: Secondary | ICD-10-CM

## 2022-03-20 DIAGNOSIS — E559 Vitamin D deficiency, unspecified: Secondary | ICD-10-CM

## 2022-03-20 NOTE — Chronic Care Management (AMB) (Signed)
Chronic Care Management   CCM RN Visit Note  03/20/2022 Name: Robyn Bailey MRN: [id number] DOB: [birth year]  Subjective: Robyn Bailey is a 81 y.o. year old female who is a primary care patient of Biagio Borg, MD. The care management team was consulted for assistance with disease management and care coordination needs.    Engaged with patient by telephone for follow up visit/ CCM RN CM case closure in response to provider referral for case management and/or care coordination services.   Consent to Services:  The patient was given information about Chronic Care Management services, agreed to services, and gave verbal consent prior to initiation of services.  Please see initial visit note for detailed documentation.  Patient agreed to services and verbal consent obtained.   Assessment: Review of patient past medical history, allergies, medications, health status, including review of consultants reports, laboratory and other test data, was performed as part of comprehensive evaluation and provision of chronic care management services.  CCM Care Plan  No Known Allergies  Outpatient Encounter Medications as of 03/20/2022  Medication Sig   CALCIUM PO Take 650 mg by mouth.   hydrochlorothiazide (MICROZIDE) 12.5 MG capsule Take 1 capsule (12.5 mg total) by mouth daily.   ketoconazole (NIZORAL) 2 % shampoo Apply 1 application topically once a week.   meloxicam (MOBIC) 15 MG tablet TAKE ONE TABLET BY MOUTH DAILY (Patient taking differently: Take 15 mg by mouth daily.)   MINOXIDIL, TOPICAL, 5 % SOLN Apply 1 application topically daily. Hair   pantoprazole (PROTONIX) 40 MG tablet TAKE ONE TABLET BY MOUTH TWICE A DAY   potassium gluconate 595 (99 K) MG TABS tablet Take 595 mg by mouth 3 (three) times a week.   Probiotic Product (ALIGN) 4 MG CAPS Take 4 mg by mouth See admin instructions. Every other month   rosuvastatin (CRESTOR) 10 MG tablet Take 1 tablet (10 mg total) by mouth daily.   tizanidine  (ZANAFLEX) 2 MG capsule TAKE ONE CAPSULE BY MOUTH THREE TIMES A DAY   triamcinolone (NASACORT AQ) 55 MCG/ACT AERO nasal inhaler Place 2 sprays into the nose daily. (Patient taking differently: Place 2 sprays into the nose daily as needed (congestion).)   Vitamin D3 (VITAMIN D) 25 MCG tablet Take 1,000 Units by mouth daily.   No facility-administered encounter medications on file as of 03/20/2022.   Patient Active Problem List   Diagnosis Date Noted   Right sided sciatica 09/11/2020   Muscle cramps 08/29/2020   Mild peripheral edema 02/29/2020   Piriformis syndrome of right side 12/13/2019   Leg pain, lateral, right 11/17/2019   Hyperparathyroidism, primary ([place]) 12/21/2018   Hypercalcemia 12/21/2018   Vitamin D insufficiency 12/21/2018   Age-related osteoporosis without current pathological fracture 12/21/2018   Ptosis of eyelid, bilateral 10/12/2018   Ptosis 02/12/2018   Palpitations 11/12/2017   Radiculitis of right cervical region 11/11/2017   Eustachian tube dysfunction 11/08/2016   Tinnitus of left ear 09/15/2015   Insect stings 04/22/2013   Rash and nonspecific skin eruption 04/22/2013   GERD (gastroesophageal reflux disease) 09/28/2012   Left lumbar radiculopathy 02/01/2011   Left sided sciatica 01/22/2011   Encounter for well adult exam with abnormal findings 01/21/2011   Acute angle-closure glaucoma 04/20/2009   FATIGUE 04/20/2009   Blepharospasm 06/06/2008   HLD (hyperlipidemia) 07/19/2007   Anxiety state 07/19/2007   Depression 07/19/2007   Allergic rhinitis 07/19/2007   Diverticulosis of colon 07/19/2007   OSTEOARTHRITIS 07/19/2007   LOW BACK PAIN 07/19/2007  Osteoporosis 07/19/2007   ABNORMAL ELECTROCARDIOGRAM 07/19/2007   MACULAR DEGENERATION, HX OF 07/19/2007   COLONIC POLYPS, HX OF 07/19/2007   PERIPHERAL NEUROPATHY 06/08/2007   Conditions to be addressed/monitored:  HLD and vitamin D deficiency with history of fall  Care Plan : RN Care Manager Plan of  Care  Updates made by Knox Royalty, RN since 03/20/2022 12:00 AM     Problem: Chronic Disease Management Needs Resolved 03/20/2022  Priority: Medium     Long-Range Goal: Ongoing adherence to established plan of care for long term chronic disease management Completed 03/20/2022  Start Date: 05/08/2021  Expected End Date: 05/08/2022  Priority: Medium  Note:   Current Barriers:  Chronic Disease Management support and education needs related to HLD and OA/ Vitamin D deficiency  RNCM Clinical Goal(s):  Patient will demonstrate ongoing health management independence for HLD/ OA/ Vitamin D deficiency  through collaboration with RN Care manager, provider, and care team.   Interventions: 1:1 collaboration with primary care provider regarding development and update of comprehensive plan of care as evidenced by provider attestation and co-signature Inter-disciplinary care team collaboration (see longitudinal plan of care) Evaluation of current treatment plan related to  self management and patient's adherence to plan as established by provider Review of patient status, including review of consultants reports, relevant laboratory and other test results, and medications completed SDOH updated: no new/ unmet concerns identified Depression screening completed: no concerns identified Pain assessment updated: continues to deny pain Falls assessment updated: continues to deny new/ recent falls x 11 months- has had no falls since last reported fall on 05/08/21; not currently using assistive devices;  positive reinforcement provided with encouragement to continue efforts at fall prevention; previously provided education around fall risks/ prevention reinforced Medications discussed: reports continues to independently self-manage and denies current concerns/ issues/ questions around medications; endorses adherence to taking all medications as prescribed Reviewed upcoming scheduled provider appointments: March  2024- PCP; patient confirms is aware of all and has plans to attend as scheduled Reviewed recent PCP office visit in March 2023: she reports "got a great report;" and denies post-office visit questions Discussed plans with patient for ongoing care management follow up- patient denies current care coordination/ care management needs and is agreeable to CCM RN CM case closure today; verbalizes understanding to contact PCP or other care providers for any needs that arise in the future, and confirms he has contact information for all care providers     Health Maintenance (Status: 03/20/22: Goal Met.) Long term goal Discussed current clinical condition with patient- reports doing well, no acute clinical concerns-- states "enjoying my time," and confirms she is staying active at baseline Confirms patient continues following heart healthy, low salt, low cholesterol diet: positive reinforcement provided with encouragement to continue efforts; will provide educational material through [place] around foods high in cholesterol Reminded patient to maintain vaccines as recommended- she reports she stays on top of all of her health care needs     Plan: No further follow up required: patient denies current care coordination/ care management needs and is agreeable to CCM RN CM case closure today; CCM RN CM case closure accordingly     [name], RN, BSN, [place] [phone]: direct office

## 2022-04-10 DIAGNOSIS — L820 Inflamed seborrheic keratosis: Secondary | ICD-10-CM | POA: Diagnosis not present

## 2022-04-15 DIAGNOSIS — E7800 Pure hypercholesterolemia, unspecified: Secondary | ICD-10-CM

## 2022-04-18 DIAGNOSIS — L821 Other seborrheic keratosis: Secondary | ICD-10-CM | POA: Diagnosis not present

## 2022-04-18 DIAGNOSIS — D485 Neoplasm of uncertain behavior of skin: Secondary | ICD-10-CM | POA: Diagnosis not present

## 2022-04-18 DIAGNOSIS — D2261 Melanocytic nevi of right upper limb, including shoulder: Secondary | ICD-10-CM | POA: Diagnosis not present

## 2022-04-18 DIAGNOSIS — L820 Inflamed seborrheic keratosis: Secondary | ICD-10-CM | POA: Diagnosis not present

## 2022-04-18 DIAGNOSIS — D2361 Other benign neoplasm of skin of right upper limb, including shoulder: Secondary | ICD-10-CM | POA: Diagnosis not present

## 2022-05-06 ENCOUNTER — Telehealth: Payer: Self-pay | Admitting: Internal Medicine

## 2022-05-06 NOTE — Telephone Encounter (Signed)
Ok to stop the crestor for 2 wks, but the RESTART it if her joint and muscle pain persist, as this can be from other things besides the crestor  If she does feel better, let us know and we can think about changing her tx

## 2022-05-06 NOTE — Telephone Encounter (Signed)
Please advise and I will update patient.

## 2022-05-06 NOTE — Telephone Encounter (Signed)
Spoke with patient and updated her on the crestor. Patient asks if it was ok to use coq10 while stopping the crestor for 2wks.

## 2022-05-06 NOTE — Telephone Encounter (Signed)
Pt states she has been taking rosuvastatin (CRESTOR) 10 MG tablet since the spring. She said since she's been taking it she has been experiencing joint and muscle pain. Pt would like to know what should she do? Should she decrease her dosage? Should she stop taking rx and try something else?    Please advise.

## 2022-05-06 NOTE — Telephone Encounter (Signed)
Yes, ok to do this

## 2022-05-07 ENCOUNTER — Telehealth: Payer: Medicare HMO

## 2022-05-07 NOTE — Telephone Encounter (Signed)
Patient notified and will give [place] an update in two weeks.

## 2022-05-21 NOTE — Telephone Encounter (Signed)
Pt is calling reporting that she fills better taking the coq10 without Crestor.

## 2022-05-21 NOTE — Telephone Encounter (Signed)
Please ask pt to continue the Coq10 but also restart the crestor only Mon-Wed-Fri as this will likely well for her, thanks

## 2022-05-21 NOTE — Telephone Encounter (Signed)
Patient called back and I read Dr. Judi Cong instructions to her.

## 2022-05-29 ENCOUNTER — Other Ambulatory Visit: Payer: Self-pay | Admitting: Internal Medicine

## 2022-05-31 IMAGING — US US THYROID
1 series · 13 of 25 positions shown · non-contrast
Comparison: None.

CLINICAL DATA: Other.  Primary hyperparathyroidism.

EXAM:
THYROID ULTRASOUND
TECHNIQUE: Ultrasound examination of the thyroid gland and adjacent soft
tissues was performed.

[Series 1: us thyroid · 35 acquisitions, 13 frames shown]
[im 1/35]
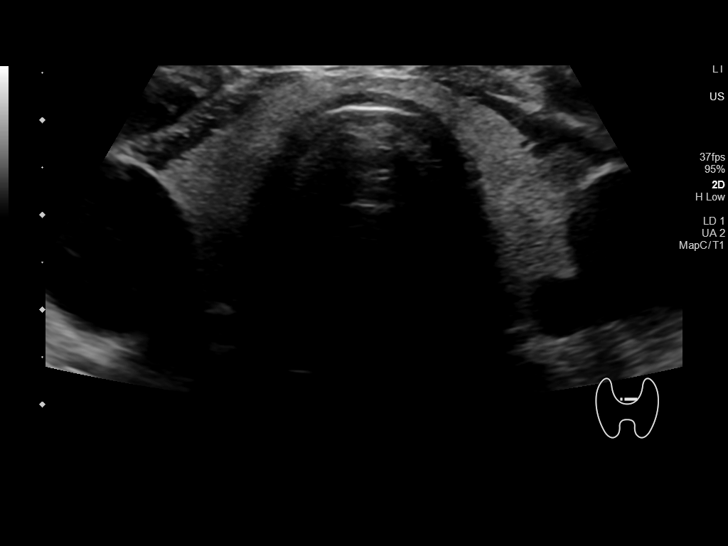
[im 3/35]
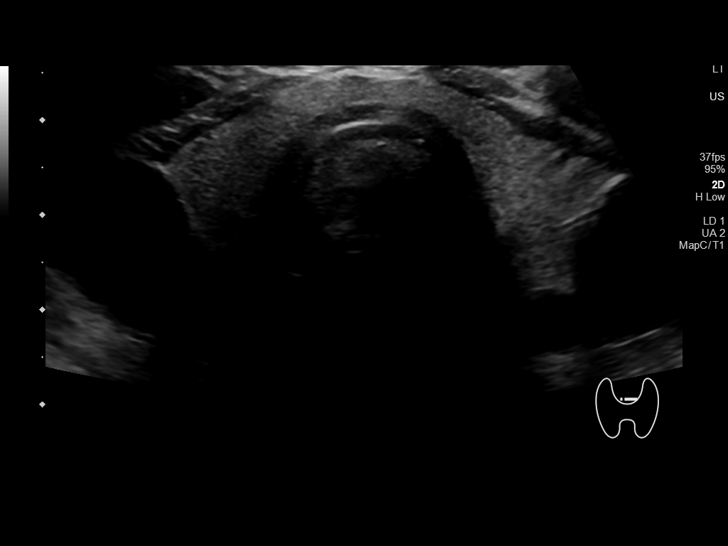
[im 6/35]
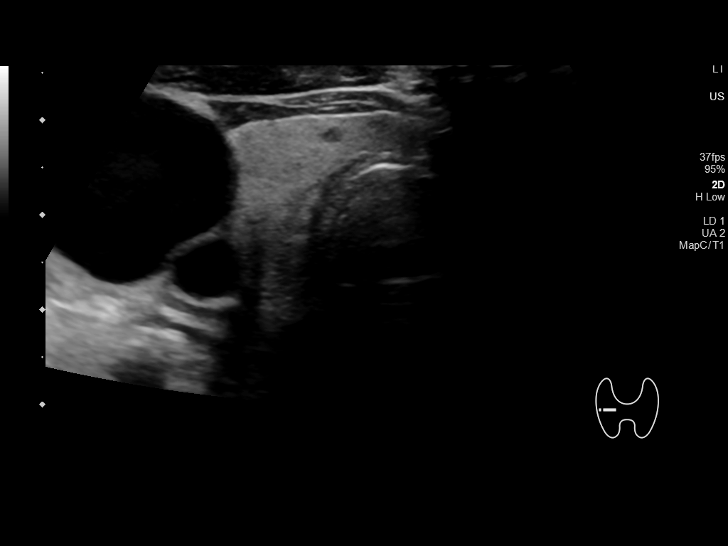
[im 9/35]
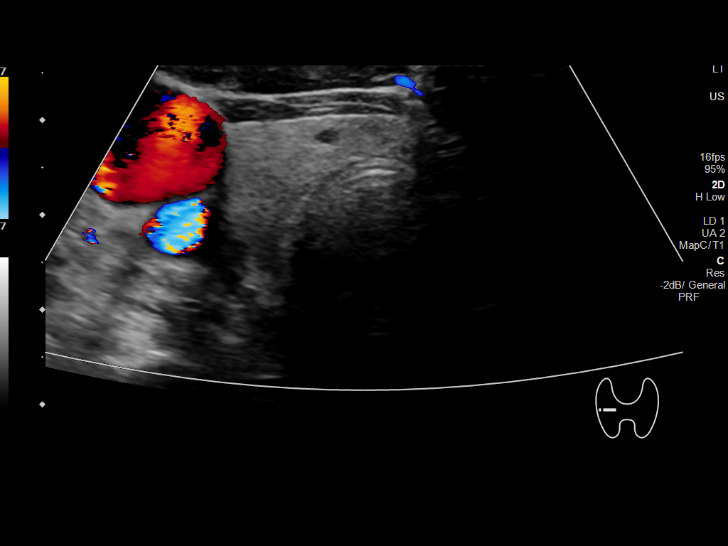
[im 12/35]
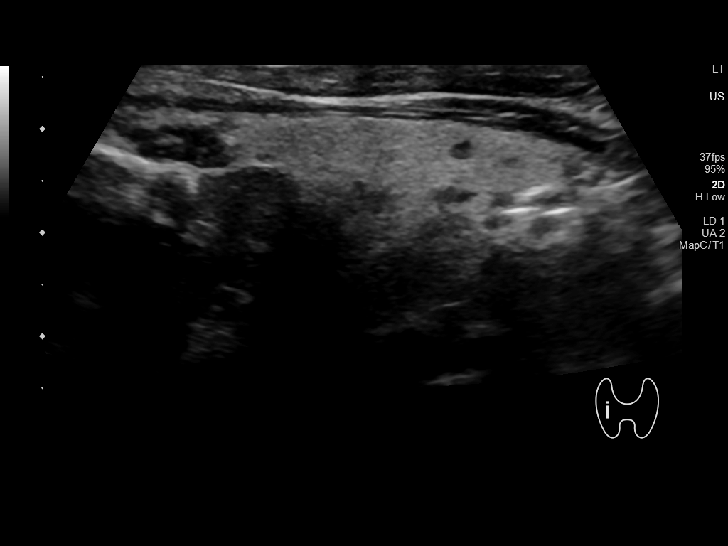
[im 15/35]
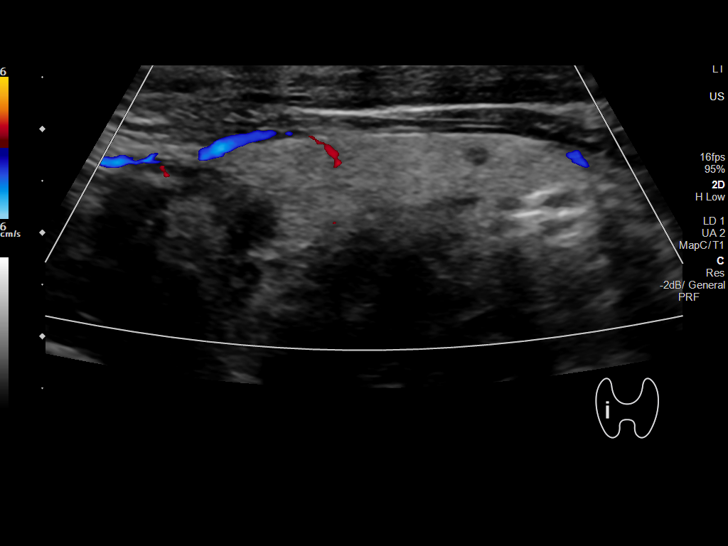
[im 18/35]
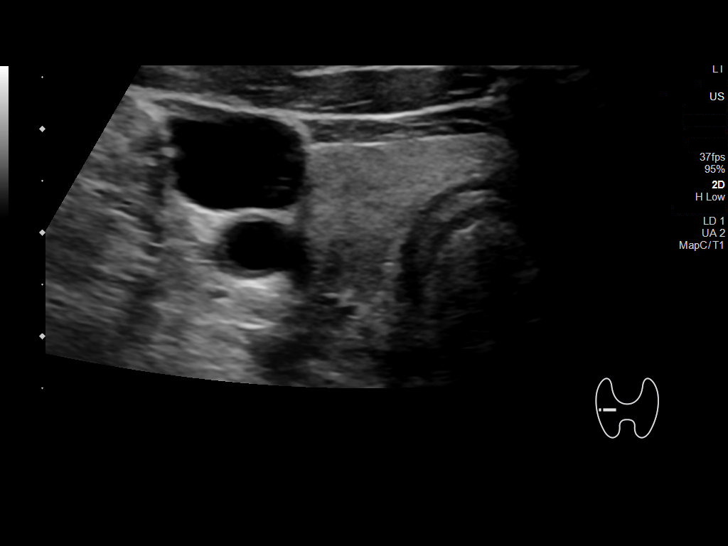
[im 20/35]
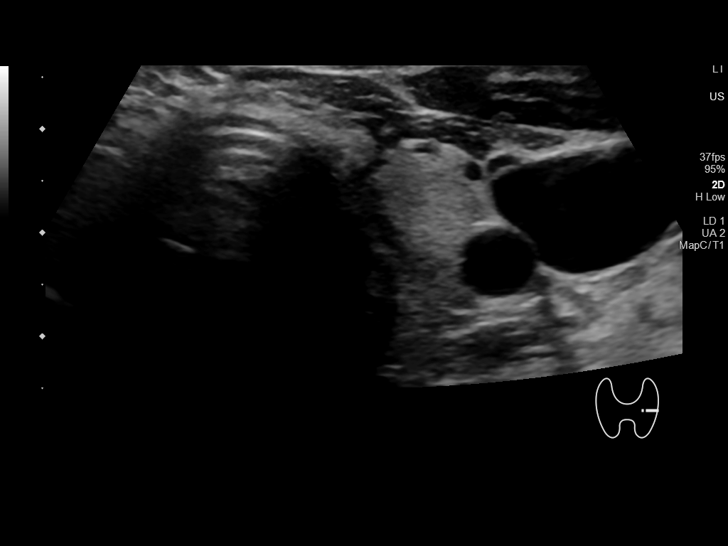
[im 23/35]
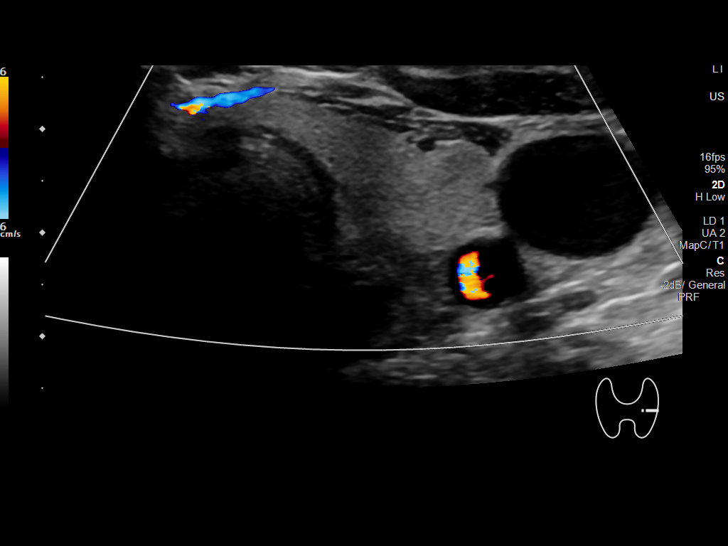
[im 26/35]
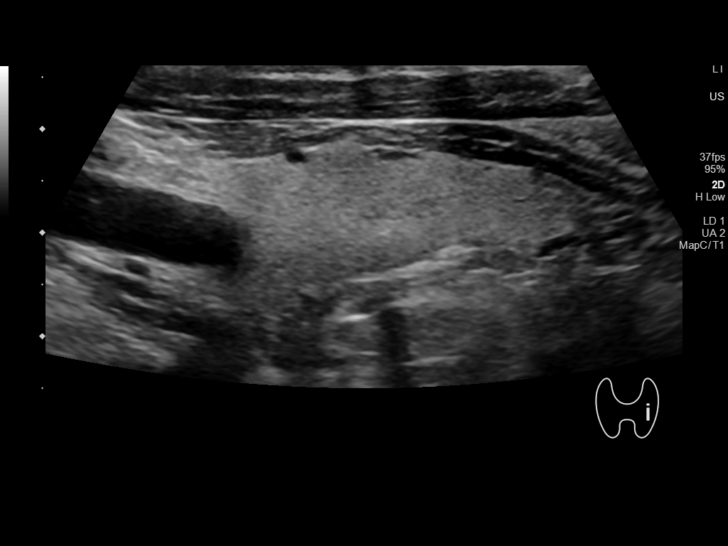
[im 29/35]
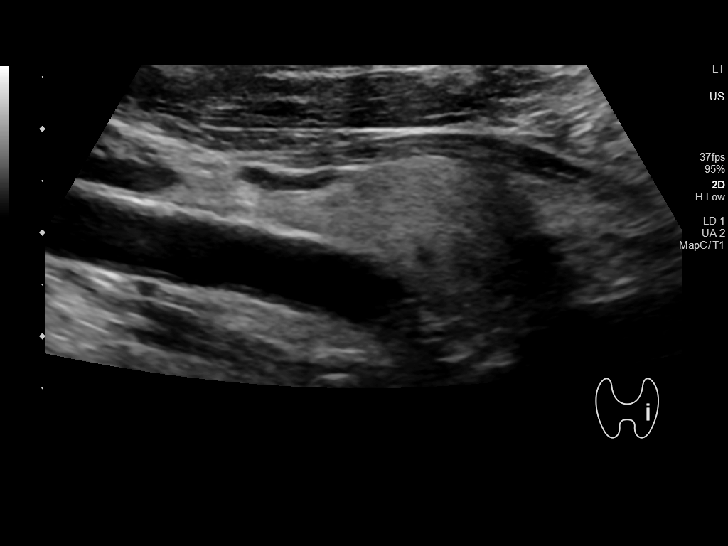
[im 32/35]
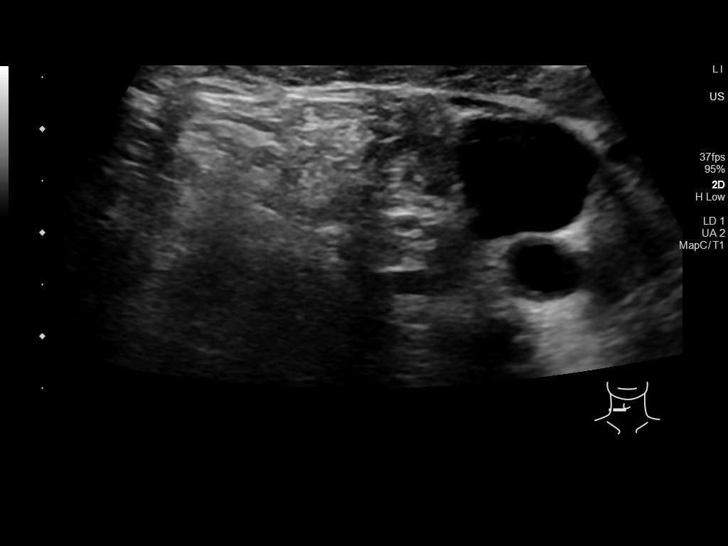
[im 35/35]
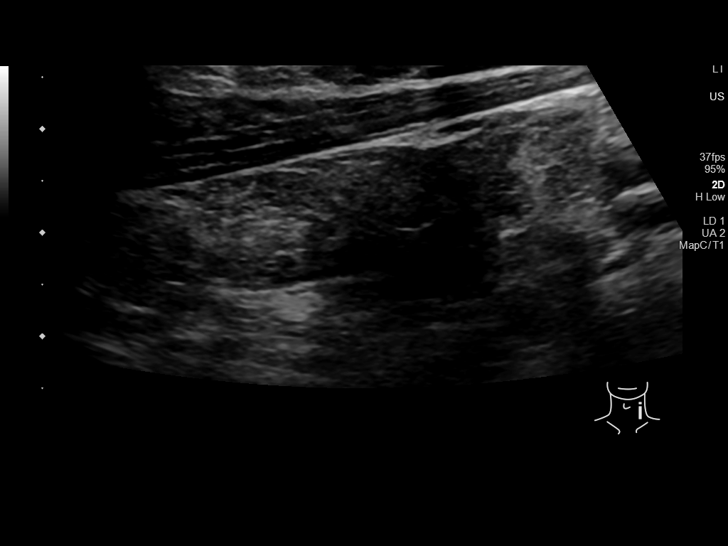

[13 of 25 positions shown; findings below may reference images not displayed]

FINDINGS: Parenchymal Echotexture: Normal

Isthmus: Normal in size measuring 0.3 cm in diameter

Right lobe: Slightly atrophic measuring 3.5 x 1.1 x 1.1 cm

Left lobe: Slightly atrophic measuring 3.7 x 1.2 x 1.1 cm

_________________________________________________________

Estimated total number of nodules >/= 1 cm: 0

Number of spongiform nodules >/=  2 cm not described below (TR1): 0

Number of mixed cystic and solid nodules >/= 1.5 cm not described
below (TR2): 0

_________________________________________________________

There is a punctate (approximately 0.3 cm) hypoechoic nodule within
mid, medial aspect the right lobe of the thyroid which does not meet
criteria to recommend percutaneous sampling or continued dedicated
follow-up.

No definitive extra thyroidal nodules are identified to suggest the
presence of a parathyroid adenoma.
IMPRESSION: 1. Slightly atrophic but otherwise normal appearing thyroid without
worrisome nodule or mass.
2. Solitary punctate (approximately 0.3 cm) right-sided thyroid
nodule does not meet criteria to recommend percutaneous sampling or
continued dedicated follow-up.
3. No definitive extra thyroidal nodules to suggest the presence of
a parathyroid adenoma.

The above is in keeping with the ACR TI-RADS recommendations - [HOSPITAL] 7489;[DATE].

## 2022-06-04 ENCOUNTER — Ambulatory Visit (INDEPENDENT_AMBULATORY_CARE_PROVIDER_SITE_OTHER): Payer: Medicare HMO | Admitting: Internal Medicine

## 2022-06-04 ENCOUNTER — Ambulatory Visit (INDEPENDENT_AMBULATORY_CARE_PROVIDER_SITE_OTHER): Payer: Medicare HMO

## 2022-06-04 VITALS — BP 120/70 | HR 66 | Temp 97.8°F | Ht 65.0 in | Wt 158.0 lb

## 2022-06-04 DIAGNOSIS — E611 Iron deficiency: Secondary | ICD-10-CM | POA: Diagnosis not present

## 2022-06-04 DIAGNOSIS — I351 Nonrheumatic aortic (valve) insufficiency: Secondary | ICD-10-CM

## 2022-06-04 DIAGNOSIS — Z23 Encounter for immunization: Secondary | ICD-10-CM

## 2022-06-04 DIAGNOSIS — R0600 Dyspnea, unspecified: Secondary | ICD-10-CM

## 2022-06-04 DIAGNOSIS — R0789 Other chest pain: Secondary | ICD-10-CM | POA: Diagnosis not present

## 2022-06-04 DIAGNOSIS — E7800 Pure hypercholesterolemia, unspecified: Secondary | ICD-10-CM

## 2022-06-04 DIAGNOSIS — R079 Chest pain, unspecified: Secondary | ICD-10-CM

## 2022-06-04 DIAGNOSIS — R5383 Other fatigue: Secondary | ICD-10-CM | POA: Diagnosis not present

## 2022-06-04 LAB — URINALYSIS, ROUTINE W REFLEX MICROSCOPIC
Bilirubin Urine: NEGATIVE
Ketones, ur: NEGATIVE
Leukocytes,Ua: NEGATIVE
Nitrite: NEGATIVE
Specific Gravity, Urine: <=1.005 — AB (ref 1.000–1.030)
Total Protein, Urine: NEGATIVE
Urine Glucose: NEGATIVE
Urobilinogen, UA: 0.2 (ref 0.0–1.0)
WBC, UA: NONE SEEN (ref 0–?)
pH: 6 (ref 5.0–8.0)

## 2022-06-04 LAB — HEPATIC FUNCTION PANEL
ALT: 13 U/L (ref 0–35)
AST: 20 U/L (ref 0–37)
Albumin: 4.1 g/dL (ref 3.5–5.2)
Alkaline Phosphatase: 55 U/L (ref 39–117)
Bilirubin, Direct: 0.1 mg/dL (ref 0.0–0.3)
Total Bilirubin: 0.5 mg/dL (ref 0.2–1.2)
Total Protein: 7.4 g/dL (ref 6.0–8.3)

## 2022-06-04 LAB — CBC WITH DIFFERENTIAL/PLATELET
Basophils Absolute: 0 10*3/uL (ref 0.0–0.1)
Basophils Relative: 0.6 % (ref 0.0–3.0)
Eosinophils Absolute: 0.2 10*3/uL (ref 0.0–0.7)
Eosinophils Relative: 2.9 % (ref 0.0–5.0)
HCT: 43 % (ref 36.0–46.0)
Hemoglobin: 14.6 g/dL (ref 12.0–15.0)
Lymphocytes Relative: 32.9 % (ref 12.0–46.0)
Lymphs Abs: 1.9 10*3/uL (ref 0.7–4.0)
MCHC: 33.9 g/dL (ref 30.0–36.0)
MCV: 83.3 fl (ref 78.0–100.0)
Monocytes Absolute: 0.6 10*3/uL (ref 0.1–1.0)
Monocytes Relative: 10.5 % (ref 3.0–12.0)
Neutro Abs: 3.1 10*3/uL (ref 1.4–7.7)
Neutrophils Relative %: 53.1 % (ref 43.0–77.0)
Platelets: 189 10*3/uL (ref 150.0–400.0)
RBC: 5.16 Mil/uL — ABNORMAL HIGH (ref 3.87–5.11)
RDW: 13.8 % (ref 11.5–15.5)
WBC: 5.9 10*3/uL (ref 4.0–10.5)

## 2022-06-04 LAB — BASIC METABOLIC PANEL
BUN: 19 mg/dL (ref 6–23)
CO2: 30 mEq/L (ref 19–32)
Calcium: 9.6 mg/dL (ref 8.4–10.5)
Chloride: 103 mEq/L (ref 96–112)
Creatinine, Ser: 0.79 mg/dL (ref 0.40–1.20)
GFR: 70.18 mL/min (ref >60.00)
Glucose, Bld: 81 mg/dL (ref 70–99)
Potassium: 4.1 mEq/L (ref 3.5–5.1)
Sodium: 139 mEq/L (ref 135–145)

## 2022-06-04 LAB — LIPID PANEL
Cholesterol: 175 mg/dL (ref 0–200)
HDL: 68.1 mg/dL (ref >39.00)
LDL Cholesterol: 79 mg/dL (ref 0–99)
NonHDL: 107.12
Total CHOL/HDL Ratio: 3
Triglycerides: 143 mg/dL (ref 0.0–149.0)
VLDL: 28.6 mg/dL (ref 0.0–40.0)

## 2022-06-04 LAB — IBC PANEL
Iron: 81 ug/dL (ref 42–145)
Saturation Ratios: 22.5 % (ref 20.0–50.0)
TIBC: 359.8 ug/dL (ref 250.0–450.0)
Transferrin: 257 mg/dL (ref 212.0–360.0)

## 2022-06-04 LAB — TSH: TSH: 1.16 u[IU]/mL (ref 0.35–5.50)

## 2022-06-04 LAB — SEDIMENTATION RATE: Sed Rate: 22 mm/hr (ref 0–30)

## 2022-06-04 LAB — D-DIMER, QUANTITATIVE: D-Dimer, Quant: 0.26 mcg/mL FEU (ref <0.50)

## 2022-06-04 LAB — CORTISOL: Cortisol, Plasma: 10.1 ug/dL

## 2022-06-04 LAB — FERRITIN: Ferritin: 33.3 ng/mL (ref 10.0–291.0)

## 2022-06-04 LAB — BRAIN NATRIURETIC PEPTIDE: Pro B Natriuretic peptide (BNP): 37 pg/mL (ref 0.0–100.0)

## 2022-06-04 MED ORDER — ALBUTEROL SULFATE HFA 108 (90 BASE) MCG/ACT IN AERS: 2.0000 | INHALATION_SPRAY | Freq: Four times a day (QID) | RESPIRATORY_TRACT | 3 refills | Status: DC | PRN

## 2022-06-04 NOTE — Patient Instructions (Addendum)
Please take all new medication as prescribed - the inhaler as needed  Your EKG was done today  Please continue all other medications as before, and refills have been done if requested.  Please have the pharmacy call with any other refills you may need.  Please continue your efforts at being more active, low cholesterol diet, and weight control.  You are otherwise up to date with prevention measures today.  Please keep your appointments with your specialists as you may have planned  You will be contacted regarding the referral for: stress testing  Please go to the XRAY Department in the first floor for the x-ray testing  Please go to the LAB at the blood drawing area for the tests to be done  You will be contacted by phone if any changes need to be made immediately.  Otherwise, you will receive a letter about your results with an explanation, but please check with MyChart first.  Please remember to sign up for MyChart if you have not done so, as this will be important to you in the future with finding out test results, communicating by private email, and scheduling acute appointments online when needed.  Please make an Appointment to return in December 14 2022, or sooner if needed

## 2022-06-04 NOTE — Assessment & Plan Note (Signed)
Clinically stable, cont to follow

## 2022-06-04 NOTE — Progress Notes (Unsigned)
Patient ID: Robyn Bailey, female   DOB: 06-Sep-1941, 81 y.o.   MRN: [id number]        Chief Complaint: follow up "I'm getting older"       HPI:  Robyn Bailey is a 81 y.o. female here to mention above, lives alone in her own home for now, Pt denies chest pain, wheezing, orthopnea, PND, increased LE swelling, palpitations, dizziness or syncope, though has been more sob and doe, also with anterior chest pressure improved with rest.   Pt denies polydipsia, polyuria, or new focal neuro s/s. But the heat really got to her and became less active and did not go the gym, gained wt, doesn't like this.  Now starting to get back to the gym and walking.  Good med compliance.   Pt denies fever, wt loss, night sweats, loss of appetite, or other constitutional symptoms   OTC nasal spray       Wt Readings from Last 3 Encounters:  06/04/22 158 lb (71.7 kg)  11/22/21 155 lb (70.3 kg)  11/22/21 155 lb (70.3 kg)   BP Readings from Last 3 Encounters:  06/04/22 120/70  11/22/21 118/70  11/22/21 118/70         Past Medical History:  Diagnosis Date   ABNORMAL ELECTROCARDIOGRAM 07/19/2007   ALLERGIC RHINITIS 07/19/2007   Anatomical narrow angle glaucoma 04/20/2009   ANXIETY 07/19/2007   BACK PAIN 11/08/2009   Blepharospasm 06/06/2008   COLONIC POLYPS, HX OF 07/19/2007   Cough 09/22/2007   DEPRESSION 07/19/2007   DIVERTICULOSIS, COLON 07/19/2007   Dyspnea    exertion   FATIGUE 04/20/2009   GERD (gastroesophageal reflux disease)    HYPERLIPIDEMIA 07/19/2007   Left sided sciatica 01/22/2011   LOW BACK PAIN 07/19/2007   OSTEOARTHRITIS 07/19/2007   OSTEOPOROSIS 07/19/2007   PERIPHERAL NEUROPATHY 06/08/2007   Ptosis of eyelid, bilateral    RHINITIS 02/27/2009   SINUSITIS- ACUTE-NOS 11/04/2007   SORE THROAT 02/27/2009   Past Surgical History:  Procedure Laterality Date   BREAST BIOPSY     CATARACT EXTRACTION, BILATERAL     COLONOSCOPY     EYE SURGERY     cataract   hammer toe surgury  2003   bilateral   PTOSIS REPAIR  Bilateral 10/12/2018   Procedure: Bilateral ptosis eyelid correction external approach suture technique;  Surgeon: Irene Limbo, MD;  Location: [facility];  Service: Plastics;  Laterality: Bilateral;   TONSILLECTOMY      reports that she has quit smoking. She has never used smokeless tobacco. She reports current alcohol use. She reports that she does not use drugs. family history includes Colon cancer in her maternal uncle and mother. No Known Allergies Current Outpatient Medications on File Prior to Visit  Medication Sig Dispense Refill   co-enzyme Q-10 30 MG capsule Take 30 mg by mouth 3 (three) times daily.     meloxicam (MOBIC) 15 MG tablet TAKE ONE TABLET BY MOUTH DAILY (Patient taking differently: Take 15 mg by mouth daily.) 90 tablet 3   MINOXIDIL, TOPICAL, 5 % SOLN Apply 1 application topically daily. Hair     pantoprazole (PROTONIX) 40 MG tablet TAKE ONE TABLET BY MOUTH TWICE A DAY 180 tablet 1   rosuvastatin (CRESTOR) 10 MG tablet Take 1 tablet (10 mg total) by mouth daily. 90 tablet 3   triamcinolone (NASACORT AQ) 55 MCG/ACT AERO nasal inhaler Place 2 sprays into the nose daily. (Patient taking differently: Place 2 sprays into the nose daily as needed (congestion).) 1  Inhaler 12   Vitamin D3 (VITAMIN D) 25 MCG tablet Take 1,000 Units by mouth daily.     CALCIUM PO Take 650 mg by mouth.     hydrochlorothiazide (MICROZIDE) 12.5 MG capsule Take 1 capsule (12.5 mg total) by mouth daily. 30 capsule 11   ketoconazole (NIZORAL) 2 % shampoo Apply 1 application topically once a week.     potassium gluconate 595 (99 K) MG TABS tablet Take 595 mg by mouth 3 (three) times a week.     Probiotic Product (ALIGN) 4 MG CAPS Take 4 mg by mouth See admin instructions. Every other month     tizanidine (ZANAFLEX) 2 MG capsule TAKE ONE CAPSULE BY MOUTH THREE TIMES A DAY 40 capsule 2   No current facility-administered medications on file prior to visit.        ROS:  All others  reviewed and negative.  Objective        PE:  BP 120/70 (BP Location: Right Arm, Patient Position: Sitting, Cuff Size: Large)   Pulse 66   Temp 97.8 F (36.6 C) (Oral)   Ht '5\' 5"'$  (1.651 m)   Wt 158 lb (71.7 kg)   SpO2 95%   BMI 26.29 kg/m                 Constitutional: Pt appears in NAD               HENT: Head: NCAT.                Right Ear: External ear normal.                 Left Ear: External ear normal.                Eyes: . Pupils are equal, round, and reactive to light. Conjunctivae and EOM are normal               Nose: without d/c or deformity               Neck: Neck supple. Gross normal ROM               Cardiovascular: Normal rate and regular rhythm.                 Pulmonary/Chest: Effort normal and breath sounds without rales or wheezing.                Abd:  Soft, NT, ND, + BS, no organomegaly               Neurological: Pt is alert. At baseline orientation, motor grossly intact               Skin: Skin is warm. No rashes, no other new lesions, LE edema - ***               Psychiatric: Pt behavior is normal without agitation   Micro: none  Cardiac tracings I have personally interpreted today:  none  Pertinent Radiological findings (summarize): none   Lab Results  Component Value Date   WBC 4.2 11/20/2021   HGB 14.3 11/20/2021   HCT 42.5 11/20/2021   PLT 176.0 11/20/2021   GLUCOSE 69 (L) 11/20/2021   CHOL 161 11/20/2021   TRIG 106.0 11/20/2021   HDL 77.30 11/20/2021   LDLDIRECT 134.0 10/20/2013   LDLCALC 62 11/20/2021   ALT 15 11/20/2021   AST 16 11/20/2021   NA 141 11/20/2021   K 3.7  11/20/2021   CL 104 11/20/2021   CREATININE 0.86 11/20/2021   BUN 26 (H) 11/20/2021   CO2 30 11/20/2021   TSH 1.07 11/20/2021   HGBA1C 5.4 11/20/2021   Assessment/Plan:  Robyn Bailey is a 81 y.o. White or Caucasian [1] female with  has a past medical history of ABNORMAL ELECTROCARDIOGRAM (07/19/2007), ALLERGIC RHINITIS (07/19/2007), Anatomical narrow angle  glaucoma (04/20/2009), ANXIETY (07/19/2007), BACK PAIN (11/08/2009), Blepharospasm (06/06/2008), COLONIC POLYPS, HX OF (07/19/2007), Cough (09/22/2007), DEPRESSION (07/19/2007), DIVERTICULOSIS, COLON (07/19/2007), Dyspnea, FATIGUE (04/20/2009), GERD (gastroesophageal reflux disease), HYPERLIPIDEMIA (07/19/2007), Left sided sciatica (01/22/2011), LOW BACK PAIN (07/19/2007), OSTEOARTHRITIS (07/19/2007), OSTEOPOROSIS (07/19/2007), PERIPHERAL NEUROPATHY (06/08/2007), Ptosis of eyelid, bilateral, RHINITIS (02/27/2009), SINUSITIS- ACUTE-NOS (11/04/2007), and SORE THROAT (02/27/2009).  No problem-specific Assessment & Plan notes found for this encounter.  Followup: No follow-ups on file.  Cathlean Cower, MD 06/04/2022 10:28 AM [facility]

## 2022-06-05 ENCOUNTER — Telehealth (HOSPITAL_COMMUNITY): Payer: Self-pay | Admitting: *Deleted

## 2022-06-05 ENCOUNTER — Encounter: Payer: Self-pay | Admitting: Internal Medicine

## 2022-06-05 NOTE — Assessment & Plan Note (Signed)
Atypical, ecg reviewed, etiology unclear, for cardiac stress test

## 2022-06-05 NOTE — Telephone Encounter (Signed)
Close encounter 

## 2022-06-05 NOTE — Assessment & Plan Note (Signed)
Etiology unclear, for albuterol hfa prn, for cxr, and labs as ordered including cbc

## 2022-06-05 NOTE — Assessment & Plan Note (Addendum)
Pt non toxic, o/w asymptomatic, for labs as ordered includin bmp, cortisol, esr 

## 2022-06-05 NOTE — Assessment & Plan Note (Signed)
Lab Results  Component Value Date   LDLCALC 79 06/04/2022   Stable, pt to continue current statin crestor 10 mg qd

## 2022-06-06 ENCOUNTER — Other Ambulatory Visit: Payer: Self-pay | Admitting: Internal Medicine

## 2022-06-06 MED ORDER — AZITHROMYCIN 250 MG PO TABS: ORAL_TABLET | ORAL | 1 refills | Status: AC

## 2022-06-07 ENCOUNTER — Ambulatory Visit (HOSPITAL_COMMUNITY)
Admission: RE | Admit: 2022-06-07 | Discharge: 2022-06-07 | Disposition: A | Discharge status: Home / Self Care | Payer: Medicare HMO | Source: Ambulatory Visit | Attending: Cardiology | Admitting: Cardiology

## 2022-06-07 DIAGNOSIS — R079 Chest pain, unspecified: Secondary | ICD-10-CM | POA: Diagnosis not present

## 2022-06-07 LAB — MYOCARDIAL PERFUSION IMAGING
Angina Index: 0
Duke Treadmill Score: 5
Estimated workload: 7
Exercise duration (min): 5 min
Exercise duration (sec): 1 s
LV dias vol: 75 mL (ref 46–106)
LV sys vol: 26 mL
MPHR: 139 {beats}/min
Nuc Stress EF: 65 %
Peak HR: 139 {beats}/min
Percent HR: 100 %
Rest HR: 71 {beats}/min
Rest Nuclear Isotope Dose: 10.6 mCi
SDS: 1
SRS: 1
SSS: 2
ST Depression (mm): 0 mm
Stress Nuclear Isotope Dose: 31.1 mCi
TID: 1.06

## 2022-06-07 MED ORDER — TECHNETIUM TC 99M TETROFOSMIN IV KIT
10.6000 | PACK | Freq: Once | INTRAVENOUS | Status: AC | PRN
  Administered 2022-06-07: 10.6 via INTRAVENOUS

## 2022-06-07 MED ORDER — TECHNETIUM TC 99M TETROFOSMIN IV KIT
31.1000 | PACK | Freq: Once | INTRAVENOUS | Status: AC | PRN
  Administered 2022-06-07: 31.1 via INTRAVENOUS

## 2022-07-24 ENCOUNTER — Other Ambulatory Visit: Payer: Self-pay | Admitting: Internal Medicine

## 2022-07-24 NOTE — Telephone Encounter (Signed)
Please refill as per office routine med refill policy (all routine meds to be refilled for 3 mo or monthly (per pt preference) up to one year from last visit, then month to month grace period for 3 mo, then further med refills will have to be denied) ? ?

## 2022-08-06 ENCOUNTER — Ambulatory Visit (INDEPENDENT_AMBULATORY_CARE_PROVIDER_SITE_OTHER): Payer: Medicare HMO

## 2022-08-06 ENCOUNTER — Encounter: Payer: Self-pay | Admitting: Internal Medicine

## 2022-08-06 ENCOUNTER — Ambulatory Visit (INDEPENDENT_AMBULATORY_CARE_PROVIDER_SITE_OTHER): Payer: Medicare HMO | Admitting: Internal Medicine

## 2022-08-06 VITALS — BP 120/66 | HR 70 | Temp 97.8°F | Ht 65.0 in | Wt 157.0 lb

## 2022-08-06 DIAGNOSIS — M25562 Pain in left knee: Secondary | ICD-10-CM

## 2022-08-06 DIAGNOSIS — S8002XA Contusion of left knee, initial encounter: Secondary | ICD-10-CM | POA: Diagnosis not present

## 2022-08-06 DIAGNOSIS — E559 Vitamin D deficiency, unspecified: Secondary | ICD-10-CM | POA: Diagnosis not present

## 2022-08-06 DIAGNOSIS — M79662 Pain in left lower leg: Secondary | ICD-10-CM

## 2022-08-06 DIAGNOSIS — M7989 Other specified soft tissue disorders: Secondary | ICD-10-CM

## 2022-08-06 NOTE — Patient Instructions (Addendum)
Please continue all other medications as before, including the mobic and tylenol  Please have the pharmacy call with any other refills you may need.  Please keep your appointments with your specialists as you may have planned  You will be contacted regarding the referral for: leg vein ultrasound  Please go to the XRAY Department in the first floor for the x-ray testing  You will be contacted by phone if any changes need to be made immediately.  Otherwise, you will receive a letter about your results with an explanation, but please check with MyChart first.  Please remember to sign up for MyChart if you have not done so, as this will be important to you in the future with finding out test results, communicating by private email, and scheduling acute appointments online when needed.

## 2022-08-06 NOTE — Assessment & Plan Note (Signed)
Can't r/o patellar fx - for xray today

## 2022-08-06 NOTE — Assessment & Plan Note (Signed)
Last vitamin D Lab Results  Component Value Date   VD25OH 57.29 11/20/2021   Stable, cont oral replacement

## 2022-08-06 NOTE — Assessment & Plan Note (Signed)
Also large swelling and pain, can't r/o dvt - for stat venous doppler

## 2022-08-06 NOTE — Progress Notes (Signed)
Patient ID: Robyn Bailey, female   DOB: December 10, 1940, 81 y.o.   MRN: [id number]        Chief Complaint: follow up left lower leg and knee pain and swelling       HPI:  Robyn Bailey is a 81 y.o. female here after fall over the rail halfway up the stairs from the basement juggling soup cans.  Now, pt s/p fall sun nov 19 to the left knee with large contusion medially with swelling, also whole left leg swelling and pain as well.  Mobic and tylenol ok for pain but has to have support and cane to walk.  No other falls.  Pt denies chest pain, increased sob or doe, wheezing, orthopnea, PND, increased LE swelling, palpitations, dizziness or syncope.   Pt denies polydipsia, polyuria, or new focal neuro s/s.     Wt Readings from Last 3 Encounters:  08/06/22 157 lb (71.2 kg)  06/07/22 158 lb (71.7 kg)  06/04/22 158 lb (71.7 kg)   BP Readings from Last 3 Encounters:  08/06/22 120/66  06/04/22 120/70  11/22/21 118/70         Past Medical History:  Diagnosis Date   ABNORMAL ELECTROCARDIOGRAM 07/19/2007   ALLERGIC RHINITIS 07/19/2007   Anatomical narrow angle glaucoma 04/20/2009   ANXIETY 07/19/2007   BACK PAIN 11/08/2009   Blepharospasm 06/06/2008   COLONIC POLYPS, HX OF 07/19/2007   Cough 09/22/2007   DEPRESSION 07/19/2007   DIVERTICULOSIS, COLON 07/19/2007   Dyspnea    exertion   FATIGUE 04/20/2009   GERD (gastroesophageal reflux disease)    HYPERLIPIDEMIA 07/19/2007   Left sided sciatica 01/22/2011   LOW BACK PAIN 07/19/2007   OSTEOARTHRITIS 07/19/2007   OSTEOPOROSIS 07/19/2007   PERIPHERAL NEUROPATHY 06/08/2007   Ptosis of eyelid, bilateral    RHINITIS 02/27/2009   SINUSITIS- ACUTE-NOS 11/04/2007   SORE THROAT 02/27/2009   Past Surgical History:  Procedure Laterality Date   BREAST BIOPSY     CATARACT EXTRACTION, BILATERAL     COLONOSCOPY     EYE SURGERY     cataract   hammer toe surgury  2003   bilateral   PTOSIS REPAIR Bilateral 10/12/2018   Procedure: Bilateral ptosis eyelid correction external  approach suture technique;  Surgeon: Irene Limbo, MD;  Location: [facility];  Service: Plastics;  Laterality: Bilateral;   TONSILLECTOMY      reports that she has quit smoking. She has never used smokeless tobacco. She reports current alcohol use. She reports that she does not use drugs. family history includes Colon cancer in her maternal uncle and mother. No Known Allergies Current Outpatient Medications on File Prior to Visit  Medication Sig Dispense Refill   albuterol (VENTOLIN HFA) 108 (90 Base) MCG/ACT inhaler Inhale 2 puffs into the lungs every 6 (six) hours as needed for wheezing or shortness of breath. 8 g 3   cetirizine (ZYRTEC) 10 MG tablet Take by mouth.     co-enzyme Q-10 30 MG capsule Take 30 mg by mouth 3 (three) times daily.     meloxicam (MOBIC) 15 MG tablet TAKE ONE TABLET BY MOUTH DAILY (Patient taking differently: Take 15 mg by mouth daily.) 90 tablet 3   MINOXIDIL, TOPICAL, 5 % SOLN Apply 1 application topically daily. Hair     pantoprazole (PROTONIX) 40 MG tablet TAKE ONE TABLET BY MOUTH TWICE A DAY 180 tablet 1   potassium gluconate 595 (99 K) MG TABS tablet Take 595 mg by mouth 3 (three) times a week.  Probiotic Product (ALIGN) 4 MG CAPS Take 4 mg by mouth See admin instructions. Every other month     rosuvastatin (CRESTOR) 10 MG tablet TAKE ONE TABLET BY MOUTH DAILY 90 tablet 3   triamcinolone (NASACORT AQ) 55 MCG/ACT AERO nasal inhaler Place 2 sprays into the nose daily. (Patient taking differently: Place 2 sprays into the nose daily as needed (congestion).) 1 Inhaler 12   Urea 20 20 % LOTN 1 application as needed Externally Once a day     Vitamin D3 (VITAMIN D) 25 MCG tablet Take 1,000 Units by mouth daily.     Cholecalciferol (VITAMIN D) 10 MCG/ML LIQD Vitamin D     No current facility-administered medications on file prior to visit.        ROS:  All others reviewed and negative.  Objective        PE:  BP 120/66 (BP Location: Right Arm,  Patient Position: Sitting, Cuff Size: Large)   Pulse 70   Temp 97.8 F (36.6 C) (Oral)   Ht '5\' 5"'$  (1.651 m)   Wt 157 lb (71.2 kg)   SpO2 96%   BMI 26.13 kg/m                 Constitutional: Pt appears in NAD               HENT: Head: NCAT.                Right Ear: External ear normal.                 Left Ear: External ear normal.                Eyes: . Pupils are equal, round, and reactive to light. Conjunctivae and EOM are normal               Nose: without d/c or deformity               Neck: Neck supple. Gross normal ROM               Cardiovascular: Normal rate and regular rhythm.                 Pulmonary/Chest: Effort normal and breath sounds without rales or wheezing.                Abd:  Soft, NT, ND, + BS, no organomegaly               Neurological: Pt is alert. At baseline orientation, motor grossly intact               Skin: Skin is warm. No rashes, no other new lesions, LE edema - 2+ diffuse left lower leg tender sweling worse at the calf                Left knee with large medial contusion just distal to the medial joint line which is not tender but has large swelling.  No swelling to left knee lateral aspect to suggest knee arthritis.               Psychiatric: Pt behavior is normal without agitation   Micro: none  Cardiac tracings I have personally interpreted today:  none  Pertinent Radiological findings (summarize): none   Lab Results  Component Value Date   WBC 5.9 06/04/2022   HGB 14.6 06/04/2022   HCT 43.0 06/04/2022   PLT 189.0 06/04/2022   GLUCOSE 81 06/04/2022  CHOL 175 06/04/2022   TRIG 143.0 06/04/2022   HDL 68.10 06/04/2022   LDLDIRECT 134.0 10/20/2013   LDLCALC 79 06/04/2022   ALT 13 06/04/2022   AST 20 06/04/2022   NA 139 06/04/2022   K 4.1 06/04/2022   CL 103 06/04/2022   CREATININE 0.79 06/04/2022   BUN 19 06/04/2022   CO2 30 06/04/2022   TSH 1.16 06/04/2022   HGBA1C 5.4 11/20/2021   Assessment/Plan:  Robyn Bailey is a 81 y.o.  White or Caucasian [1] female with  has a past medical history of ABNORMAL ELECTROCARDIOGRAM (07/19/2007), ALLERGIC RHINITIS (07/19/2007), Anatomical narrow angle glaucoma (04/20/2009), ANXIETY (07/19/2007), BACK PAIN (11/08/2009), Blepharospasm (06/06/2008), COLONIC POLYPS, HX OF (07/19/2007), Cough (09/22/2007), DEPRESSION (07/19/2007), DIVERTICULOSIS, COLON (07/19/2007), Dyspnea, FATIGUE (04/20/2009), GERD (gastroesophageal reflux disease), HYPERLIPIDEMIA (07/19/2007), Left sided sciatica (01/22/2011), LOW BACK PAIN (07/19/2007), OSTEOARTHRITIS (07/19/2007), OSTEOPOROSIS (07/19/2007), PERIPHERAL NEUROPATHY (06/08/2007), Ptosis of eyelid, bilateral, RHINITIS (02/27/2009), SINUSITIS- ACUTE-NOS (11/04/2007), and SORE THROAT (02/27/2009).  Left knee pain Can't r/o patellar fx - for xray today  Pain and swelling of left lower leg Also large swelling and pain, can't r/o dvt - for stat venous doppler  Vitamin D insufficiency Last vitamin D Lab Results  Component Value Date   VD25OH 57.29 11/20/2021   Stable, cont oral replacement  Followup: Return if symptoms worsen or fail to improve.  Cathlean Cower, MD 08/06/2022 2:54 PM [facility]

## 2022-08-07 ENCOUNTER — Telehealth: Payer: Self-pay | Admitting: Internal Medicine

## 2022-08-07 NOTE — Telephone Encounter (Signed)
Patient called for RESULTS of knee xray yesterday.  Call pt [phone]

## 2022-08-09 ENCOUNTER — Ambulatory Visit (HOSPITAL_COMMUNITY)
Admission: RE | Admit: 2022-08-09 | Discharge: 2022-08-09 | Disposition: A | Discharge status: Home / Self Care | Payer: Medicare HMO | Source: Ambulatory Visit | Attending: Internal Medicine | Admitting: Internal Medicine

## 2022-08-09 DIAGNOSIS — M79662 Pain in left lower leg: Secondary | ICD-10-CM | POA: Insufficient documentation

## 2022-08-09 DIAGNOSIS — M7989 Other specified soft tissue disorders: Secondary | ICD-10-CM | POA: Insufficient documentation

## 2022-08-09 NOTE — Progress Notes (Signed)
Left lower extremity venous duplex has been completed. Preliminary results can be found in CV Proc through chart review.  Results were faxed to Dr. Gwynn Burly office.  08/09/22 10:03 AM Robyn Bailey [facility]

## 2022-08-12 NOTE — Telephone Encounter (Signed)
Spoke with patient regarding results from xray,patient states that she is still in pain but will give [place] an update in a week or two.

## 2022-08-20 ENCOUNTER — Encounter: Payer: Self-pay | Admitting: Emergency Medicine

## 2022-08-20 ENCOUNTER — Ambulatory Visit (INDEPENDENT_AMBULATORY_CARE_PROVIDER_SITE_OTHER): Payer: Medicare HMO | Admitting: Emergency Medicine

## 2022-08-20 VITALS — BP 128/74 | HR 76 | Temp 98.0°F | Ht 65.0 in | Wt 155.2 lb

## 2022-08-20 DIAGNOSIS — S8002XD Contusion of left knee, subsequent encounter: Secondary | ICD-10-CM

## 2022-08-20 DIAGNOSIS — M25562 Pain in left knee: Secondary | ICD-10-CM | POA: Diagnosis not present

## 2022-08-20 DIAGNOSIS — S8002XA Contusion of left knee, initial encounter: Secondary | ICD-10-CM | POA: Insufficient documentation

## 2022-08-20 MED ORDER — TRAMADOL HCL 50 MG PO TABS: 50.0000 mg | ORAL_TABLET | Freq: Three times a day (TID) | ORAL | 0 refills | Status: AC | PRN

## 2022-08-20 NOTE — Progress Notes (Signed)
Robyn Bailey 81 y.o.   Chief Complaint  Patient presents with   Acute Visit    Pain in leg,2 weeks ago patient fell, X ray  and U/S was ordered     HISTORY OF PRESENT ILLNESS: This is a 81 y.o. female complaining of pain to left knee since fall 2 weeks ago Had x-ray done and normal leg ultrasound.  No definite fracture. Taking meloxicam and Tylenol but still having persistent pain.  HPI   Prior to Admission medications   Medication Sig Start Date End Date Taking? Authorizing Provider  albuterol (VENTOLIN HFA) 108 (90 Base) MCG/ACT inhaler Inhale 2 puffs into the lungs every 6 (six) hours as needed for wheezing or shortness of breath. 06/04/22  Yes Biagio Borg, MD  cetirizine (ZYRTEC) 10 MG tablet Take by mouth. 09/15/15  Yes [provider]  Cholecalciferol (VITAMIN D) 10 MCG/ML LIQD Vitamin D   Yes [provider]  co-enzyme Q-10 30 MG capsule Take 30 mg by mouth 3 (three) times daily.   Yes [provider]  meloxicam (MOBIC) 15 MG tablet TAKE ONE TABLET BY MOUTH DAILY Patient taking differently: Take 15 mg by mouth daily. 01/25/21  Yes Biagio Borg, MD  MINOXIDIL, TOPICAL, 5 % SOLN Apply 1 application topically daily. Hair   Yes [provider]  pantoprazole (PROTONIX) 40 MG tablet TAKE ONE TABLET BY MOUTH TWICE A DAY 05/29/22  Yes Biagio Borg, MD  potassium gluconate 595 (99 K) MG TABS tablet Take 595 mg by mouth 3 (three) times a week.   Yes [provider]  Probiotic Product (ALIGN) 4 MG CAPS Take 4 mg by mouth See admin instructions. Every other month   Yes [provider]  rosuvastatin (CRESTOR) 10 MG tablet TAKE ONE TABLET BY MOUTH DAILY 07/24/22  Yes Biagio Borg, MD  triamcinolone (NASACORT AQ) 55 MCG/ACT AERO nasal inhaler Place 2 sprays into the nose daily. Patient taking differently: Place 2 sprays into the nose daily as needed (congestion). 11/08/16  Yes Biagio Borg, MD  Urea 20 20 % LOTN 1 application as needed  Externally Once a day 02/03/19  Yes [provider]  Vitamin D3 (VITAMIN D) 25 MCG tablet Take 1,000 Units by mouth daily.   Yes [provider]    No Known Allergies  Patient Active Problem List   Diagnosis Date Noted   Left knee pain 08/06/2022   Pain and swelling of left lower leg 08/06/2022   Dyspnea 06/04/2022   Mild AI (aortic insufficiency) 06/04/2022   Chest pressure 06/04/2022   Hammertoes of both feet 09/14/2021   Right sided sciatica 09/11/2020   Muscle cramps 08/29/2020   Mild peripheral edema 02/29/2020   Piriformis syndrome of right side 12/13/2019   Leg pain, lateral, right 11/17/2019   Hyperparathyroidism, primary ([place]) 12/21/2018   Hypercalcemia 12/21/2018   Vitamin D insufficiency 12/21/2018   Age-related osteoporosis without current pathological fracture 12/21/2018   Ptosis of eyelid, bilateral 10/12/2018   Ptosis 02/12/2018   Palpitations 11/12/2017   Radiculitis of right cervical region 11/11/2017   Eustachian tube dysfunction 11/08/2016   Tinnitus of left ear 09/15/2015   Insect stings 04/22/2013   Rash and nonspecific skin eruption 04/22/2013   GERD (gastroesophageal reflux disease) 09/28/2012   Left lumbar radiculopathy 02/01/2011   Left sided sciatica 01/22/2011   Encounter for well adult exam with abnormal findings 01/21/2011   Fatigue 04/20/2009   Blepharospasm 06/06/2008   HLD (hyperlipidemia) 07/19/2007  Allergic rhinitis 07/19/2007   OSTEOARTHRITIS 07/19/2007   LOW BACK PAIN 07/19/2007   Osteoporosis 07/19/2007   ABNORMAL ELECTROCARDIOGRAM 07/19/2007   PERIPHERAL NEUROPATHY 06/08/2007    Past Medical History:  Diagnosis Date   ABNORMAL ELECTROCARDIOGRAM 07/19/2007   ALLERGIC RHINITIS 07/19/2007   Anatomical narrow angle glaucoma 04/20/2009   ANXIETY 07/19/2007   BACK PAIN 11/08/2009   Blepharospasm 06/06/2008   COLONIC POLYPS, HX OF 07/19/2007   Cough 09/22/2007   DEPRESSION 07/19/2007   DIVERTICULOSIS, COLON 07/19/2007    Dyspnea    exertion   FATIGUE 04/20/2009   GERD (gastroesophageal reflux disease)    HYPERLIPIDEMIA 07/19/2007   Left sided sciatica 01/22/2011   LOW BACK PAIN 07/19/2007   OSTEOARTHRITIS 07/19/2007   OSTEOPOROSIS 07/19/2007   PERIPHERAL NEUROPATHY 06/08/2007   Ptosis of eyelid, bilateral    RHINITIS 02/27/2009   SINUSITIS- ACUTE-NOS 11/04/2007   SORE THROAT 02/27/2009    Past Surgical History:  Procedure Laterality Date   BREAST BIOPSY     CATARACT EXTRACTION, BILATERAL     COLONOSCOPY     EYE SURGERY     cataract   hammer toe surgury  2003   bilateral   PTOSIS REPAIR Bilateral 10/12/2018   Procedure: Bilateral ptosis eyelid correction external approach suture technique;  Surgeon: Irene Limbo, MD;  Location: [facility];  Service: Plastics;  Laterality: Bilateral;   TONSILLECTOMY      Social History   Socioeconomic History   Marital status: Single    Spouse name: Not on file   Number of children: Not on file   Years of education: Not on file   Highest education level: Not on file  Occupational History   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   Occupation: part time research legal  Tobacco Use   Smoking status: Former   Smokeless tobacco: Never  Scientific laboratory technician Use: Never used  Substance and Sexual Activity   Alcohol use: Yes    Alcohol/week: 0.0 standard drinks of alcohol    Comment: occasional glass of wine; rare now b/c acid reflux   Drug use: No   Sexual activity: Not Currently    Birth control/protection: Post-menopausal  Other Topics Concern   Not on file  Social History Narrative   Not on file   Social Determinants of Health   Financial Resource Strain: Low Risk  (11/22/2021)   Overall Financial Resource Strain (CARDIA)    Difficulty of Paying Living Expenses: Not hard at all  Food Insecurity: No Food Insecurity (03/20/2022)   Hunger Vital Sign    Worried About Running Out of Food in the Last Year: Never true     Ran Out of Food in the Last Year: Never true  Transportation Needs: No Transportation Needs (03/20/2022)   PRAPARE - Hydrologist (Medical): No    Lack of Transportation (Non-Medical): No  Physical Activity: Inactive (11/22/2021)   Exercise Vital Sign    Days of Exercise per Week: 0 days    Minutes of Exercise per Session: 0 min  Stress: No Stress Concern Present (11/22/2021)   [place]    Feeling of Stress : Not at all  Social Connections: Moderately Integrated (11/22/2021)   Social Connection and Isolation Panel [NHANES]    Frequency of Communication with Friends and Family: More than three times a week    Frequency of Social Gatherings with Friends and Family: More than  three times a week    Attends Religious Services: More than 4 times per year    Active Member of Clubs or Organizations: Yes    Attends Archivist Meetings: More than 4 times per year    Marital Status: Never married  Intimate Partner Violence: Not At Risk (11/22/2021)   Humiliation, Afraid, Rape, and Kick questionnaire    Fear of Current or Ex-Partner: No    Emotionally Abused: No    Physically Abused: No    Sexually Abused: No    Family History  Problem Relation Age of Onset   Colon cancer Mother    Colon cancer Maternal Uncle    Esophageal cancer Neg Hx    Rectal cancer Neg Hx    Stomach cancer Neg Hx      Review of Systems  Constitutional: Negative.  Negative for chills and fever.  HENT: Negative.  Negative for congestion and sore throat.   Respiratory: Negative.  Negative for cough and shortness of breath.   Cardiovascular: Negative.  Negative for chest pain and palpitations.  Gastrointestinal:  Negative for abdominal pain, diarrhea, nausea and vomiting.  Genitourinary: Negative.  Negative for dysuria.  Musculoskeletal:  Positive for joint pain (Left knee pain).  Skin: Negative.  Negative for rash.   Neurological: Negative.  Negative for dizziness and headaches.  All other systems reviewed and are negative.  Today's Vitals   08/20/22 1447  BP: 128/74  Pulse: 76  Temp: 98 F (36.7 C)  TempSrc: Oral  SpO2: 96%  Weight: 155 lb 4 oz (70.4 kg)  Height: '5\' 5"'$  (1.651 m)   Body mass index is 25.83 kg/m.   Physical Exam Vitals reviewed.  Constitutional:      Appearance: Normal appearance.  HENT:     Head: Normocephalic.  Eyes:     Extraocular Movements: Extraocular movements intact.     Pupils: Pupils are equal, round, and reactive to light.  Cardiovascular:     Rate and Rhythm: Normal rate.  Pulmonary:     Effort: Pulmonary effort is normal.  Musculoskeletal:     Comments: Left knee: No erythema or ecchymosis.  Full range of motion.  No swelling or significant tenderness.  Stable in flexion and extension.  Skin:    General: Skin is warm and dry.  Neurological:     General: No focal deficit present.     Mental Status: She is alert and oriented to person, place, and time.  Psychiatric:        Mood and Affect: Mood normal.        Behavior: Behavior normal.    VAS [place] LOWER EXTREMITY VENOUS (DVT)  Result Date: 08/10/2022  Lower Venous DVT Study Patient Name:  CANDELA KRUL  Date of Exam:   08/09/2022 Medical Rec #: [id number]       Accession #:    [id number] Date of Birth: July 16, 1941        Patient Gender: F Patient Age:   11 years Exam Location:  [facility] Procedure:      VAS [place] LOWER EXTREMITY VENOUS (DVT) Referring Phys: Cathlean Cower --------------------------------------------------------------------------------  Indications: Swelling, and Pain.  Risk Factors: None identified. Comparison Study: No prior studies. Performing Technologist: Oliver Hum RVT  Examination Guidelines: A complete evaluation includes B-mode imaging, spectral Doppler, color Doppler, and power Doppler as needed of all accessible portions of each vessel. Bilateral testing is considered an  integral part of a complete examination. Limited examinations for reoccurring indications may be performed  as noted. The reflux portion of the exam is performed with the patient in reverse Trendelenburg.  +-----+---------------+---------+-----------+----------+--------------+ RIGHTCompressibilityPhasicitySpontaneityPropertiesThrombus Aging +-----+---------------+---------+-----------+----------+--------------+ CFV  Full           Yes      Yes                                 +-----+---------------+---------+-----------+----------+--------------+   +---------+---------------+---------+-----------+----------+--------------+ LEFT     CompressibilityPhasicitySpontaneityPropertiesThrombus Aging +---------+---------------+---------+-----------+----------+--------------+ CFV      Full           Yes      Yes                                 +---------+---------------+---------+-----------+----------+--------------+ SFJ      Full                                                        +---------+---------------+---------+-----------+----------+--------------+ FV Prox  Full                                                        +---------+---------------+---------+-----------+----------+--------------+ FV Mid   Full                                                        +---------+---------------+---------+-----------+----------+--------------+ FV DistalFull                                                        +---------+---------------+---------+-----------+----------+--------------+ PFV      Full                                                        +---------+---------------+---------+-----------+----------+--------------+ POP      Full           Yes      Yes                                 +---------+---------------+---------+-----------+----------+--------------+ PTV      Full                                                         +---------+---------------+---------+-----------+----------+--------------+ PERO     Full                                                        +---------+---------------+---------+-----------+----------+--------------+  Summary: RIGHT: - No evidence of common femoral vein obstruction.  LEFT: - There is no evidence of deep vein thrombosis in the lower extremity.  - No cystic structure found in the popliteal fossa.  *See table(s) above for measurements and observations. Electronically signed by Harold Barban MD on 08/10/2022 at 12:10:41 AM.    Final    DG Knee Complete 4 Views Left  Result Date: 08/08/2022 CLINICAL DATA:  Fall 2 days prior. Medial contusion and swelling. Pain. EXAM: LEFT KNEE - COMPLETE 4+ VIEW COMPARISON:  None Available. FINDINGS: There is diffuse decreased bone mineralization. Minimal peripheral lateral compartment degenerative osteophytes. No joint effusion. Mild concavity of the lateral tibial plateau, age indeterminate. On oblique view there may be 1-2 mm cortical step-off of the lateral tibial plateau. Otherwise, no definitive fracture line is seen. Mild patellofemoral joint space narrowing. No dislocation. IMPRESSION: Mild concavity of the lateral tibial plateau, age indeterminate. This may represent chronic/remote posttraumatic versus degenerative change, however it is difficult to exclude an acute fracture. No joint effusion. Electronically Signed   By: Yvonne Kendall M.D.   On: 08/08/2022 16:46     ASSESSMENT & PLAN: A total of 32 minutes was spent with the patient and counseling/coordination of care regarding.  For this visit, review of most recent office visit notes, review of most recent imaging reports, diagnosis of knee contusion and need for orthopedic evaluation given persistent pain, pain management, prognosis, documentation, and need for follow-up.  Problem List Items Addressed This Visit       Other   Left knee pain - Primary    Persistent symptoms since  injury. Questionable findings on x-ray. Recommend orthopedic evaluation. Referral placed today. Pain management discussed. Continue meloxicam daily.  Tylenol for mild to moderate pain. Tramadol for moderate to severe pain.      Relevant Medications   traMADol (ULTRAM) 50 MG tablet   Other Relevant Orders   Ambulatory referral to Orthopedic Surgery   Contusion of left knee    Questionable x-ray findings. Recommend orthopedic evaluation. Pain management discussed.      Relevant Orders   Ambulatory referral to Orthopedic Surgery   Patient Instructions  Acute Knee Pain, Adult Many things can cause knee pain. Sometimes, knee pain is sudden (acute) and may be caused by damage, swelling, or irritation of the muscles and tissues that support your knee. The pain often goes away on its own with time and rest. If the pain does not go away, tests may be done to find out what is causing the pain. Follow these instructions at home: If you have a knee sleeve or brace:  Wear the knee sleeve or brace as told by your doctor. Take it off only as told by your doctor. Loosen it if your toes: Tingle. Become numb. Turn cold and blue. Keep it clean. If the knee sleeve or brace is not waterproof: Do not let it get wet. Cover it with a watertight covering when you take a bath or shower. Activity Rest your knee. Do not do things that cause pain or make pain worse. Avoid activities where both feet leave the ground at the same time (high-impact activities). Examples are running, jumping rope, and doing jumping jacks. Work with a physical therapist to make a safe exercise program, as told by your doctor. Managing pain, stiffness, and swelling  If told, put ice on the knee. To do this: If you have a removable knee sleeve or brace, take it off as told by your  doctor. Put ice in a plastic bag. Place a towel between your skin and the bag. Leave the ice on for 20 minutes, 2-3 times a day. Take off the  ice if your skin turns bright red. This is very important. If you cannot feel pain, heat, or cold, you have a greater risk of damage to the area. If told, use an elastic bandage to put pressure (compression) on your injured knee. Raise your knee above the level of your heart while you are sitting or lying down. Sleep with a pillow under your knee. General instructions Take over-the-counter and prescription medicines only as told by your doctor. Do not smoke or use any products that contain nicotine or tobacco. If you need help quitting, ask your doctor. If you are overweight, work with your doctor and a food expert (dietitian) to set goals to lose weight. Being overweight can make your knee hurt more. Watch for any changes in your symptoms. Keep all follow-up visits. Contact a doctor if: The knee pain does not stop. The knee pain changes or gets worse. You have a fever along with knee pain. Your knee is red or feels warm when you touch it. Your knee gives out or locks up. Get help right away if: Your knee swells, and the swelling gets worse. You cannot move your knee. You have very bad knee pain that does not get better with pain medicine. Summary Many things can cause knee pain. The pain often goes away on its own with time and rest. Your doctor may do tests to find out the cause of the pain. Watch for any changes in your symptoms. Relieve your pain with rest, medicines, light activity, and use of ice. Get help right away if you cannot move your knee or your knee pain is very bad. This information is not intended to replace advice given to you by your health care provider. Make sure you discuss any questions you have with your health care provider. Document Revised: 02/16/2020 Document Reviewed: 02/16/2020 Elsevier Patient Education  [place], MD [facility]

## 2022-08-20 NOTE — Patient Instructions (Signed)
Acute Knee Pain, Adult Many things can cause knee pain. Sometimes, knee pain is sudden (acute) and may be caused by damage, swelling, or irritation of the muscles and tissues that support your knee. The pain often goes away on its own with time and rest. If the pain does not go away, tests may be done to find out what is causing the pain. Follow these instructions at home: If you have a knee sleeve or brace:  Wear the knee sleeve or brace as told by your doctor. Take it off only as told by your doctor. Loosen it if your toes: Tingle. Become numb. Turn cold and blue. Keep it clean. If the knee sleeve or brace is not waterproof: Do not let it get wet. Cover it with a watertight covering when you take a bath or shower. Activity Rest your knee. Do not do things that cause pain or make pain worse. Avoid activities where both feet leave the ground at the same time (high-impact activities). Examples are running, jumping rope, and doing jumping jacks. Work with a physical therapist to make a safe exercise program, as told by your doctor. Managing pain, stiffness, and swelling  If told, put ice on the knee. To do this: If you have a removable knee sleeve or brace, take it off as told by your doctor. Put ice in a plastic bag. Place a towel between your skin and the bag. Leave the ice on for 20 minutes, 2-3 times a day. Take off the ice if your skin turns bright red. This is very important. If you cannot feel pain, heat, or cold, you have a greater risk of damage to the area. If told, use an elastic bandage to put pressure (compression) on your injured knee. Raise your knee above the level of your heart while you are sitting or lying down. Sleep with a pillow under your knee. General instructions Take over-the-counter and prescription medicines only as told by your doctor. Do not smoke or use any products that contain nicotine or tobacco. If you need help quitting, ask your doctor. If you are  overweight, work with your doctor and a food expert (dietitian) to set goals to lose weight. Being overweight can make your knee hurt more. Watch for any changes in your symptoms. Keep all follow-up visits. Contact a doctor if: The knee pain does not stop. The knee pain changes or gets worse. You have a fever along with knee pain. Your knee is red or feels warm when you touch it. Your knee gives out or locks up. Get help right away if: Your knee swells, and the swelling gets worse. You cannot move your knee. You have very bad knee pain that does not get better with pain medicine. Summary Many things can cause knee pain. The pain often goes away on its own with time and rest. Your doctor may do tests to find out the cause of the pain. Watch for any changes in your symptoms. Relieve your pain with rest, medicines, light activity, and use of ice. Get help right away if you cannot move your knee or your knee pain is very bad. This information is not intended to replace advice given to you by your health care provider. Make sure you discuss any questions you have with your health care provider. Document Revised: 02/16/2020 Document Reviewed: 02/16/2020 Elsevier Patient Education  2023 [employer]vier Inc.  

## 2022-08-20 NOTE — Assessment & Plan Note (Signed)
Persistent symptoms since injury. Questionable findings on x-ray. Recommend orthopedic evaluation. Referral placed today. Pain management discussed. Continue meloxicam daily.  Tylenol for mild to moderate pain. Tramadol for moderate to severe pain.

## 2022-08-20 NOTE — Assessment & Plan Note (Signed)
Questionable x-ray findings. Recommend orthopedic evaluation. Pain management discussed.

## 2022-08-28 ENCOUNTER — Ambulatory Visit: Payer: Medicare HMO | Admitting: Orthopaedic Surgery

## 2022-09-25 ENCOUNTER — Ambulatory Visit (INDEPENDENT_AMBULATORY_CARE_PROVIDER_SITE_OTHER): Payer: Medicare HMO | Admitting: Orthopaedic Surgery

## 2022-09-25 ENCOUNTER — Encounter: Payer: Self-pay | Admitting: Orthopaedic Surgery

## 2022-09-25 ENCOUNTER — Ambulatory Visit (INDEPENDENT_AMBULATORY_CARE_PROVIDER_SITE_OTHER): Payer: Medicare HMO

## 2022-09-25 VITALS — BP 139/85 | HR 80 | Ht 65.0 in | Wt 155.0 lb

## 2022-09-25 DIAGNOSIS — M25562 Pain in left knee: Secondary | ICD-10-CM | POA: Diagnosis not present

## 2022-09-25 DIAGNOSIS — S82143A Displaced bicondylar fracture of unspecified tibia, initial encounter for closed fracture: Secondary | ICD-10-CM

## 2022-09-25 NOTE — Progress Notes (Signed)
Office Visit Note   Patient: Robyn Bailey           Date of Birth: 1941/05/20           MRN: [id number] Visit Date: 09/25/2022              Requested by: Horald Pollen, [place],  [place] [address] PCP: Biagio Borg, MD   Assessment & Plan: Visit Diagnoses:  1. Acute pain of left knee   2. Tibial plateau fracture, unspecified laterality, closed, initial encounter     Plan: return after MRI.  MRI needs to be urgent not stat.  Patient has a walker at home it was her mother she will use this walker and touchdown weight-bear apply no more than 50% weight-bear on her left lower extremity pending MRI review.  We discussed with her catching her knee she may have meniscal tear.  Follow-up after MRI scan.  Follow-Up Instructions: No follow-ups on file.   Orders:  Orders Placed This Encounter  Procedures   XR KNEE 3 VIEW LEFT   No orders of the defined types were placed in this encounter.     Procedures: No procedures performed   Clinical Data: No additional findings.   Subjective: Chief Complaint  Patient presents with   Left Knee - Pain    Fall 11/192023    HPI 82 year old female fell on the 4 step carrying some cans up from the basement on 07/2018/23.  She had some x-rays ordered by Dr. Jenny Reichmann on 08/08/2022 available for review on PACS and new images were obtained today.  This shows some depression in the lateral tibial plateau centrally.  Fracture today appears to be subacute history of bone density test in the past with some osteopenia.  Review of Systems no history of additional falls patient is amatory without any walking aids.   Objective: Vital Signs: BP 139/85   Pulse 80   Ht '5\' 5"'$  (1.651 m)   Wt 155 lb (70.3 kg)   BMI 25.79 kg/m   Physical Exam Constitutional:      Appearance: She is well-developed.  HENT:     Head: Normocephalic.     Right Ear: External ear normal.     Left Ear: External ear normal. There is no impacted  cerumen.  Eyes:     Pupils: Pupils are equal, round, and reactive to light.  Neck:     Thyroid: No thyromegaly.     Trachea: No tracheal deviation.  Cardiovascular:     Rate and Rhythm: Normal rate.  Pulmonary:     Effort: Pulmonary effort is normal.  Abdominal:     Palpations: Abdomen is soft.  Musculoskeletal:     Cervical back: No rigidity.  Skin:    General: Skin is warm and dry.  Neurological:     Mental Status: She is alert and oriented to person, place, and time.  Psychiatric:        Behavior: Behavior normal.     Ortho Exam patient has mild knee swelling some tenderness lateral tibial plateau region no varus or valgus.  Full extension of her knee.  Specialty Comments:  No specialty comments available.  Imaging: XR KNEE 3 VIEW LEFT  Result Date: 09/25/2022 Standing AP both knees lateral left knee obtained and reviewed.  This shows lateral tibial plateau fracture with some central depression several millimeters.  Comparison 08/08/2022 radiographs are unchanged.  Small cortical step-off noted in the metaphyseal region laterally. Impression: Subacute central  depression lateral tibial plateau fracture.    PMFS History: Patient Active Problem List   Diagnosis Date Noted   Tibial plateau fracture, unspecified laterality, closed, initial encounter 09/25/2022   Contusion of left knee 08/20/2022   Left knee pain 08/06/2022   Pain and swelling of left lower leg 08/06/2022   Dyspnea 06/04/2022   Mild AI (aortic insufficiency) 06/04/2022   Chest pressure 06/04/2022   Hammertoes of both feet 09/14/2021   Right sided sciatica 09/11/2020   Muscle cramps 08/29/2020   Mild peripheral edema 02/29/2020   Piriformis syndrome of right side 12/13/2019   Leg pain, lateral, right 11/17/2019   Hyperparathyroidism, primary ([place]) 12/21/2018   Hypercalcemia 12/21/2018   Vitamin D insufficiency 12/21/2018   Age-related osteoporosis without current pathological fracture 12/21/2018    Ptosis of eyelid, bilateral 10/12/2018   Ptosis 02/12/2018   Palpitations 11/12/2017   Radiculitis of right cervical region 11/11/2017   Eustachian tube dysfunction 11/08/2016   Tinnitus of left ear 09/15/2015   Insect stings 04/22/2013   Rash and nonspecific skin eruption 04/22/2013   GERD (gastroesophageal reflux disease) 09/28/2012   Left lumbar radiculopathy 02/01/2011   Left sided sciatica 01/22/2011   Encounter for well adult exam with abnormal findings 01/21/2011   Fatigue 04/20/2009   Blepharospasm 06/06/2008   HLD (hyperlipidemia) 07/19/2007   Allergic rhinitis 07/19/2007   OSTEOARTHRITIS 07/19/2007   LOW BACK PAIN 07/19/2007   Osteoporosis 07/19/2007   ABNORMAL ELECTROCARDIOGRAM 07/19/2007   PERIPHERAL NEUROPATHY 06/08/2007   Past Medical History:  Diagnosis Date   ABNORMAL ELECTROCARDIOGRAM 07/19/2007   ALLERGIC RHINITIS 07/19/2007   Anatomical narrow angle glaucoma 04/20/2009   ANXIETY 07/19/2007   BACK PAIN 11/08/2009   Blepharospasm 06/06/2008   COLONIC POLYPS, HX OF 07/19/2007   Cough 09/22/2007   DEPRESSION 07/19/2007   DIVERTICULOSIS, COLON 07/19/2007   Dyspnea    exertion   FATIGUE 04/20/2009   GERD (gastroesophageal reflux disease)    HYPERLIPIDEMIA 07/19/2007   Left sided sciatica 01/22/2011   LOW BACK PAIN 07/19/2007   OSTEOARTHRITIS 07/19/2007   OSTEOPOROSIS 07/19/2007   PERIPHERAL NEUROPATHY 06/08/2007   Ptosis of eyelid, bilateral    RHINITIS 02/27/2009   SINUSITIS- ACUTE-NOS 11/04/2007   SORE THROAT 02/27/2009    Family History  Problem Relation Age of Onset   Colon cancer Mother    Colon cancer Maternal Uncle    Esophageal cancer Neg Hx    Rectal cancer Neg Hx    Stomach cancer Neg Hx     Past Surgical History:  Procedure Laterality Date   BREAST BIOPSY     CATARACT EXTRACTION, BILATERAL     COLONOSCOPY     EYE SURGERY     cataract   hammer toe surgury  2003   bilateral   PTOSIS REPAIR Bilateral 10/12/2018   Procedure: Bilateral ptosis eyelid  correction external approach suture technique;  Surgeon: Irene Limbo, MD;  Location: [facility];  Service: Plastics;  Laterality: Bilateral;   TONSILLECTOMY     Social History   Occupational History   Occupation: retired Engineer, 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

## 2022-09-26 ENCOUNTER — Telehealth: Payer: Self-pay | Admitting: Orthopaedic Surgery

## 2022-09-26 NOTE — Telephone Encounter (Signed)
Patient called asked if she can get her MRI scheduled? Patient said this is suppose to be a stat MRI.  Patient was in the office yesterday. Patient said she was suppose to be going to [facility]. MRI on her left knee.  The number to contact patient is [phone]

## 2022-09-30 ENCOUNTER — Ambulatory Visit (HOSPITAL_COMMUNITY)
Admission: RE | Admit: 2022-09-30 | Discharge: 2022-09-30 | Disposition: A | Discharge status: Home / Self Care | Payer: Medicare HMO | Source: Ambulatory Visit | Attending: Orthopaedic Surgery | Admitting: Orthopaedic Surgery

## 2022-09-30 DIAGNOSIS — M25562 Pain in left knee: Secondary | ICD-10-CM | POA: Diagnosis not present

## 2022-09-30 DIAGNOSIS — S83282A Other tear of lateral meniscus, current injury, left knee, initial encounter: Secondary | ICD-10-CM | POA: Diagnosis not present

## 2022-09-30 DIAGNOSIS — S82143A Displaced bicondylar fracture of unspecified tibia, initial encounter for closed fracture: Secondary | ICD-10-CM | POA: Diagnosis not present

## 2022-09-30 DIAGNOSIS — S82142A Displaced bicondylar fracture of left tibia, initial encounter for closed fracture: Secondary | ICD-10-CM | POA: Diagnosis not present

## 2022-10-02 ENCOUNTER — Telehealth: Payer: Self-pay | Admitting: Internal Medicine

## 2022-10-02 NOTE — Telephone Encounter (Signed)
My chart message is send to pt

## 2022-10-02 NOTE — Telephone Encounter (Signed)
Patient had an MRI of her knee - she would like Dr. Jenny Reichmann to look at it and tell her his impression.  Patient states that she is still in pain and wants to know if this is something she will just have to learn to live with or if he has any suggestions.  Patients number:  [phone]

## 2022-10-02 NOTE — Telephone Encounter (Signed)
Waiting on PA from her insurance to approve before can schedule

## 2022-10-04 ENCOUNTER — Telehealth: Payer: Self-pay | Admitting: Internal Medicine

## 2022-10-04 MED ORDER — ROSUVASTATIN CALCIUM 10 MG PO TABS: ORAL_TABLET | ORAL | 0 refills | Status: DC

## 2022-10-04 NOTE — Telephone Encounter (Signed)
Rx sent to [facility].Marland KitchenJohny Chess

## 2022-10-04 NOTE — Telephone Encounter (Signed)
[name] from [name] called stating that an updated prescription for the patients medication rosuvastatin (CRESTOR) 10 MG tablet needs to be sent to her pharmacy on file  [name] [name], [name]  . She states that it needs to state that she only needs to take it 3 times a week since the patient stated that was how she was instructed to take it by Dr. Jenny Reichmann. Best callback number for [name] is [phone].

## 2022-10-07 NOTE — Telephone Encounter (Signed)
Pt is checking the status of her MRI results, she spoke to Dr. Lorin Mercy office, they informed her that she wouldn't need surgery but gave no other care recommendations.   Pt has seen the [employer] from [facility] but would really like to hear from Dr. Jenny Reichmann once he reviews the imaging. Please call: [phone]

## 2022-10-08 NOTE — Telephone Encounter (Signed)
Please see MRI results and make a note if possible per patient request

## 2022-10-08 NOTE — Telephone Encounter (Signed)
The knee MRI is c/w small fracture and associated swellling.  Please continue to follow ortho recommendations, and consider f/u there if has persistent pain or swelling

## 2022-10-08 NOTE — Telephone Encounter (Signed)
Response given to patient via mychart

## 2022-10-15 ENCOUNTER — Ambulatory Visit: Payer: Medicare HMO | Admitting: Orthopaedic Surgery

## 2022-11-07 ENCOUNTER — Other Ambulatory Visit: Payer: Self-pay | Admitting: Internal Medicine

## 2022-11-13 ENCOUNTER — Telehealth: Payer: Self-pay

## 2022-11-13 ENCOUNTER — Telehealth: Payer: Self-pay | Admitting: Internal Medicine

## 2022-11-13 NOTE — Telephone Encounter (Signed)
Called patient to schedule Medicare Annual Wellness Visit (AWV). Left message for patient to call back and schedule Medicare Annual Wellness Visit (AWV).  Last date of AWV: 11/23/21  Please schedule an appointment at any time with [facility] or [facility] 2.   Norton Blizzard, [place] ([facility])  [facility] [phone]

## 2022-11-13 NOTE — Telephone Encounter (Signed)
Informed patient that labs have been ordered and she can have them drawn at her convenience

## 2022-11-13 NOTE — Telephone Encounter (Signed)
Patient would like labs done prior to appointment on 11/27/22 so they can discuss at the appointment. She would like a call when they are ordered. Best callback number is [phone]

## 2022-11-20 ENCOUNTER — Other Ambulatory Visit (INDEPENDENT_AMBULATORY_CARE_PROVIDER_SITE_OTHER): Payer: Medicare HMO

## 2022-11-20 ENCOUNTER — Telehealth: Payer: Self-pay

## 2022-11-20 ENCOUNTER — Telehealth: Payer: Self-pay | Admitting: Internal Medicine

## 2022-11-20 DIAGNOSIS — E7800 Pure hypercholesterolemia, unspecified: Secondary | ICD-10-CM | POA: Diagnosis not present

## 2022-11-20 DIAGNOSIS — E210 Primary hyperparathyroidism: Secondary | ICD-10-CM

## 2022-11-20 DIAGNOSIS — T148XXA Other injury of unspecified body region, initial encounter: Secondary | ICD-10-CM

## 2022-11-20 DIAGNOSIS — E2839 Other primary ovarian failure: Secondary | ICD-10-CM

## 2022-11-20 DIAGNOSIS — R739 Hyperglycemia, unspecified: Secondary | ICD-10-CM | POA: Diagnosis not present

## 2022-11-20 LAB — HEMOGLOBIN A1C: Hgb A1c MFr Bld: 5.3 % (ref 4.6–6.5)

## 2022-11-20 LAB — MICROALBUMIN / CREATININE URINE RATIO
Creatinine,U: 21 mg/dL
Microalb Creat Ratio: 3.3 mg/g (ref 0.0–30.0)
Microalb, Ur: <0.7 mg/dL (ref 0.0–1.9)

## 2022-11-20 LAB — CBC WITH DIFFERENTIAL/PLATELET
Basophils Absolute: 0 10*3/uL (ref 0.0–0.1)
Basophils Relative: 0.4 % (ref 0.0–3.0)
Eosinophils Absolute: 0.2 10*3/uL (ref 0.0–0.7)
Eosinophils Relative: 3.3 % (ref 0.0–5.0)
HCT: 42.3 % (ref 36.0–46.0)
Hemoglobin: 14.3 g/dL (ref 12.0–15.0)
Lymphocytes Relative: 34.3 % (ref 12.0–46.0)
Lymphs Abs: 1.7 10*3/uL (ref 0.7–4.0)
MCHC: 33.9 g/dL (ref 30.0–36.0)
MCV: 83.6 fl (ref 78.0–100.0)
Monocytes Absolute: 0.5 10*3/uL (ref 0.1–1.0)
Monocytes Relative: 11 % (ref 3.0–12.0)
Neutro Abs: 2.5 10*3/uL (ref 1.4–7.7)
Neutrophils Relative %: 51 % (ref 43.0–77.0)
Platelets: 207 10*3/uL (ref 150.0–400.0)
RBC: 5.06 Mil/uL (ref 3.87–5.11)
RDW: 13.8 % (ref 11.5–15.5)
WBC: 4.9 10*3/uL (ref 4.0–10.5)

## 2022-11-20 LAB — URINALYSIS, ROUTINE W REFLEX MICROSCOPIC
Bilirubin Urine: NEGATIVE
Ketones, ur: NEGATIVE
Nitrite: NEGATIVE
Specific Gravity, Urine: <=1.005 — AB (ref 1.000–1.030)
Total Protein, Urine: NEGATIVE
Urine Glucose: NEGATIVE
Urobilinogen, UA: 0.2 (ref 0.0–1.0)
pH: 6 (ref 5.0–8.0)

## 2022-11-20 LAB — BASIC METABOLIC PANEL
BUN: 25 mg/dL — ABNORMAL HIGH (ref 6–23)
CO2: 28 mEq/L (ref 19–32)
Calcium: 9.3 mg/dL (ref 8.4–10.5)
Chloride: 104 mEq/L (ref 96–112)
Creatinine, Ser: 0.73 mg/dL (ref 0.40–1.20)
GFR: 76.91 mL/min (ref >60.00)
Glucose, Bld: 79 mg/dL (ref 70–99)
Potassium: 3.6 mEq/L (ref 3.5–5.1)
Sodium: 140 mEq/L (ref 135–145)

## 2022-11-20 LAB — HEPATIC FUNCTION PANEL
ALT: 18 U/L (ref 0–35)
AST: 20 U/L (ref 0–37)
Albumin: 3.8 g/dL (ref 3.5–5.2)
Alkaline Phosphatase: 58 U/L (ref 39–117)
Bilirubin, Direct: 0.1 mg/dL (ref 0.0–0.3)
Total Bilirubin: 0.5 mg/dL (ref 0.2–1.2)
Total Protein: 6.9 g/dL (ref 6.0–8.3)

## 2022-11-20 LAB — TSH: TSH: 1.53 u[IU]/mL (ref 0.35–5.50)

## 2022-11-20 LAB — LIPID PANEL
Cholesterol: 179 mg/dL (ref 0–200)
HDL: 75.3 mg/dL (ref >39.00)
LDL Cholesterol: 85 mg/dL (ref 0–99)
NonHDL: 104.06
Total CHOL/HDL Ratio: 2
Triglycerides: 97 mg/dL (ref 0.0–149.0)
VLDL: 19.4 mg/dL (ref 0.0–40.0)

## 2022-11-20 NOTE — Telephone Encounter (Signed)
CD is ready for pickup!

## 2022-11-20 NOTE — Telephone Encounter (Signed)
Ok to let pt know - I ordered the Bone Density testing  Please help her with appt to be made at [facility]

## 2022-11-20 NOTE — Telephone Encounter (Signed)
Robyn Bailey from [facility] called requesting for a bone density test to be ordered for pt. Pt has an tibia fracture.  Best call back # [phone]

## 2022-11-20 NOTE — Telephone Encounter (Signed)
Patient would like a copy of her left knee X-rays.  Stated that she will pick them up in the morning.  Thank you.

## 2022-11-21 LAB — PTH, INTACT AND CALCIUM
Calcium: 9.2 mg/dL (ref 8.6–10.4)
PTH: 27 pg/mL (ref 16–77)

## 2022-11-22 DIAGNOSIS — M25562 Pain in left knee: Secondary | ICD-10-CM | POA: Diagnosis not present

## 2022-11-27 ENCOUNTER — Ambulatory Visit (INDEPENDENT_AMBULATORY_CARE_PROVIDER_SITE_OTHER): Payer: Medicare HMO | Admitting: Internal Medicine

## 2022-11-27 VITALS — BP 136/82 | HR 70 | Temp 97.6°F | Ht 65.0 in | Wt 157.2 lb

## 2022-11-27 DIAGNOSIS — M810 Age-related osteoporosis without current pathological fracture: Secondary | ICD-10-CM | POA: Diagnosis not present

## 2022-11-27 DIAGNOSIS — E559 Vitamin D deficiency, unspecified: Secondary | ICD-10-CM | POA: Diagnosis not present

## 2022-11-27 DIAGNOSIS — G8929 Other chronic pain: Secondary | ICD-10-CM | POA: Diagnosis not present

## 2022-11-27 DIAGNOSIS — E7800 Pure hypercholesterolemia, unspecified: Secondary | ICD-10-CM

## 2022-11-27 DIAGNOSIS — M25562 Pain in left knee: Secondary | ICD-10-CM

## 2022-11-27 DIAGNOSIS — Z0001 Encounter for general adult medical examination with abnormal findings: Secondary | ICD-10-CM

## 2022-11-27 MED ORDER — MELOXICAM 15 MG PO TABS: 15.0000 mg | ORAL_TABLET | Freq: Every day | ORAL | 1 refills | Status: AC | PRN

## 2022-11-27 NOTE — Progress Notes (Signed)
Patient ID: Robyn Bailey, female   DOB: Oct 07, 1940, 82 y.o.   MRN: [id number]         Chief Complaint:: wellness exam and Annual Exam (Wants to talk about meloxicam for left knee pain )  , osteoporosis, hld, low vit d       HPI:  Robyn Bailey is a 82 y.o. female here for wellness exam; declines tdap, covid booster o/w up to date                        Also s/p recent left leg fracture, MRI confirmed fracture per dr Lorin Mercy, then later saw Dr Rhona Raider with cortisone to left knee that really helped.  Pt denies chest pain, increased sob or doe, wheezing, orthopnea, PND, increased LE swelling, palpitations, dizziness or syncope.   Pt denies polydipsia, polyuria, or new focal neuro s/s.    Pt denies fever, wt loss, night sweats, loss of appetite, or other constitutional symptoms  Also has left > right knee pain and intermittent swelling for several wks, without giveaways or falling   Wt Readings from Last 3 Encounters:  11/27/22 157 lb 4 oz (71.3 kg)  09/25/22 155 lb (70.3 kg)  08/20/22 155 lb 4 oz (70.4 kg)   BP Readings from Last 3 Encounters:  11/27/22 136/82  09/25/22 139/85  08/20/22 128/74   Immunization History  Administered Date(s) Administered   Fluad Quad(high Dose 65+) 07/22/2019, 06/16/2020, 05/25/2021, 06/04/2022   PFIZER Comirnaty(Gray Top)Covid-19 Tri-Sucrose Vaccine 11/08/2019, 12/01/2019, 06/23/2020   Pneumococcal Conjugate-13 10/20/2013   Pneumococcal Polysaccharide-23 05/17/2006   Td 04/20/2009   Zoster Recombinat (Shingrix) 10/25/2020, 02/15/2021   Health Maintenance Due  Topic Date Due   DTaP/Tdap/Td (2 - Tdap) 04/21/2019   COVID-19 Vaccine (4 - 2023-24 season) 05/17/2022   Medicare Annual Wellness (AWV)  11/23/2022      Past Medical History:  Diagnosis Date   ABNORMAL ELECTROCARDIOGRAM 07/19/2007   ALLERGIC RHINITIS 07/19/2007   Anatomical narrow angle glaucoma 04/20/2009   ANXIETY 07/19/2007   BACK PAIN 11/08/2009   Blepharospasm 06/06/2008   COLONIC POLYPS, HX  OF 07/19/2007   Cough 09/22/2007   DEPRESSION 07/19/2007   DIVERTICULOSIS, COLON 07/19/2007   Dyspnea    exertion   FATIGUE 04/20/2009   GERD (gastroesophageal reflux disease)    HYPERLIPIDEMIA 07/19/2007   Left sided sciatica 01/22/2011   LOW BACK PAIN 07/19/2007   OSTEOARTHRITIS 07/19/2007   OSTEOPOROSIS 07/19/2007   PERIPHERAL NEUROPATHY 06/08/2007   Ptosis of eyelid, bilateral    RHINITIS 02/27/2009   SINUSITIS- ACUTE-NOS 11/04/2007   SORE THROAT 02/27/2009   Past Surgical History:  Procedure Laterality Date   BREAST BIOPSY     CATARACT EXTRACTION, BILATERAL     COLONOSCOPY     EYE SURGERY     cataract   hammer toe surgury  2003   bilateral   PTOSIS REPAIR Bilateral 10/12/2018   Procedure: Bilateral ptosis eyelid correction external approach suture technique;  Surgeon: Irene Limbo, MD;  Location: [facility];  Service: Plastics;  Laterality: Bilateral;   TONSILLECTOMY      reports that she has quit smoking. She has never used smokeless tobacco. She reports current alcohol use. She reports that she does not use drugs. family history includes Colon cancer in her maternal uncle and mother. No Known Allergies Current Outpatient Medications on File Prior to Visit  Medication Sig Dispense Refill   albuterol (VENTOLIN HFA) 108 (90 Base) MCG/ACT inhaler Inhale 2 puffs into  the lungs every 6 (six) hours as needed for wheezing or shortness of breath. 8 g 3   cetirizine (ZYRTEC) 10 MG tablet Take by mouth.     Cholecalciferol (VITAMIN D) 10 MCG/ML LIQD Vitamin D     co-enzyme Q-10 30 MG capsule Take 30 mg by mouth 3 (three) times daily.     MINOXIDIL, TOPICAL, 5 % SOLN Apply 1 application topically daily. Hair     pantoprazole (PROTONIX) 40 MG tablet TAKE ONE TABLET BY MOUTH TWICE A DAY 180 tablet 1   potassium gluconate 595 (99 K) MG TABS tablet Take 595 mg by mouth 3 (three) times a week.     Probiotic Product (ALIGN) 4 MG CAPS Take 4 mg by mouth See admin instructions. Every  other month     rosuvastatin (CRESTOR) 10 MG tablet Take 1 tablet by mouth three times a week 12 tablet 0   triamcinolone (NASACORT AQ) 55 MCG/ACT AERO nasal inhaler Place 2 sprays into the nose daily. (Patient taking differently: Place 2 sprays into the nose daily as needed (congestion).) 1 Inhaler 12   Urea 20 20 % LOTN 1 application as needed Externally Once a day     Vitamin D3 (VITAMIN D) 25 MCG tablet Take 1,000 Units by mouth daily.     No current facility-administered medications on file prior to visit.        ROS:  All others reviewed and negative.  Objective        PE:  BP 136/82   Pulse 70   Temp 97.6 F (36.4 C) (Oral)   Ht 5\' 5"  (1.651 m)   Wt 157 lb 4 oz (71.3 kg)   SpO2 98%   BMI 26.17 kg/m                 Constitutional: Pt appears in NAD               HENT: Head: NCAT.                Right Ear: External ear normal.                 Left Ear: External ear normal.                Eyes: . Pupils are equal, round, and reactive to light. Conjunctivae and EOM are normal               Nose: without d/c or deformity               Neck: Neck supple. Gross normal ROM               Cardiovascular: Normal rate and regular rhythm.                 Pulmonary/Chest: Effort normal and breath sounds without rales or wheezing.                Abd:  Soft, NT, ND, + BS, no organomegaly               Neurological: Pt is alert. At baseline orientation, motor grossly intact               Skin: Skin is warm. No rashes, no other new lesions, LE edema - none               Psychiatric: Pt behavior is normal without agitation   Micro: none  Cardiac tracings I have personally interpreted today:  none  Pertinent Radiological findings (summarize): none   Lab Results  Component Value Date   WBC 4.9 11/20/2022   HGB 14.3 11/20/2022   HCT 42.3 11/20/2022   PLT 207.0 11/20/2022   GLUCOSE 79 11/20/2022   CHOL 179 11/20/2022   TRIG 97.0 11/20/2022   HDL 75.30 11/20/2022   LDLDIRECT 134.0  10/20/2013   LDLCALC 85 11/20/2022   ALT 18 11/20/2022   AST 20 11/20/2022   NA 140 11/20/2022   K 3.6 11/20/2022   CL 104 11/20/2022   CREATININE 0.73 11/20/2022   BUN 25 (H) 11/20/2022   CO2 28 11/20/2022   TSH 1.53 11/20/2022   HGBA1C 5.3 11/20/2022   MICROALBUR <0.7 11/20/2022   Assessment/Plan:  Robyn Bailey is a 82 y.o. White or Caucasian [1] female with  has a past medical history of ABNORMAL ELECTROCARDIOGRAM (07/19/2007), ALLERGIC RHINITIS (07/19/2007), Anatomical narrow angle glaucoma (04/20/2009), ANXIETY (07/19/2007), BACK PAIN (11/08/2009), Blepharospasm (06/06/2008), COLONIC POLYPS, HX OF (07/19/2007), Cough (09/22/2007), DEPRESSION (07/19/2007), DIVERTICULOSIS, COLON (07/19/2007), Dyspnea, FATIGUE (04/20/2009), GERD (gastroesophageal reflux disease), HYPERLIPIDEMIA (07/19/2007), Left sided sciatica (01/22/2011), LOW BACK PAIN (07/19/2007), OSTEOARTHRITIS (07/19/2007), OSTEOPOROSIS (07/19/2007), PERIPHERAL NEUROPATHY (06/08/2007), Ptosis of eyelid, bilateral, RHINITIS (02/27/2009), SINUSITIS- ACUTE-NOS (11/04/2007), and SORE THROAT (02/27/2009).  Age-related osteoporosis without current pathological fracture With recent fx, for f/u DXA scan  Encounter for well adult exam with abnormal findings Age and sex appropriate education and counseling updated with regular exercise and diet Referrals for preventative services - for DXA Immunizations addressed - declines tdap, covid booster Smoking counseling  - none needed Evidence for depression or other mood disorder - none significant Most recent labs reviewed. I have personally reviewed and have noted: 1) the patient's medical and social history 2) The patient's current medications and supplements 3) The patient's height, weight, and BMI have been recorded in the chart   HLD (hyperlipidemia) Lab Results  Component Value Date   LDLCALC 85 11/20/2022   Stable, pt to continue current statin crestor 10 mg qd   Left knee pain Worsening pain  left > right, for mobic 15 mg prn,  to f/u any worsening symptoms or concerns  Vitamin D insufficiency Last vitamin D Lab Results  Component Value Date   VD25OH 57.29 11/20/2021   Stable, cont oral replacement  Followup: Return in about 6 months (around 05/30/2023).  Cathlean Cower, MD 11/30/2022 4:52 AM [place]

## 2022-11-27 NOTE — Patient Instructions (Addendum)
Please consider having the Tdap tetanus shot at the pharmacy  Continue the plan for the Bone Density for tomorrow.  Please continue all other medications as before, and refills have been done if requested - the mobic  Please have the pharmacy call with any other refills you may need.  Please continue your efforts at being more active, low cholesterol diet, and weight control.  You are otherwise up to date with prevention measures today.  Please keep your appointments with your specialists as you may have planned  Your recent blood work was completely normal, including the sugar and cholesterol  Please make an Appointment to return in 6 months, or sooner if needed

## 2022-11-28 ENCOUNTER — Ambulatory Visit: Payer: Medicare HMO | Admitting: Internal Medicine

## 2022-11-28 ENCOUNTER — Ambulatory Visit (INDEPENDENT_AMBULATORY_CARE_PROVIDER_SITE_OTHER)
Admission: RE | Admit: 2022-11-28 | Discharge: 2022-11-28 | Disposition: A | Discharge status: Home / Self Care | Payer: Medicare HMO | Source: Ambulatory Visit | Attending: Internal Medicine | Admitting: Internal Medicine

## 2022-11-28 DIAGNOSIS — E2839 Other primary ovarian failure: Secondary | ICD-10-CM | POA: Diagnosis not present

## 2022-11-28 DIAGNOSIS — T148XXA Other injury of unspecified body region, initial encounter: Secondary | ICD-10-CM

## 2022-11-30 ENCOUNTER — Encounter: Payer: Self-pay | Admitting: Internal Medicine

## 2022-11-30 NOTE — Assessment & Plan Note (Signed)
Worsening pain left > right, for mobic 15 mg prn,  to f/u any worsening symptoms or concerns

## 2022-11-30 NOTE — Assessment & Plan Note (Signed)
Age and sex appropriate education and counseling updated with regular exercise and diet Referrals for preventative services - for DXA Immunizations addressed - declines tdap, covid booster Smoking counseling  - none needed Evidence for depression or other mood disorder - none significant Most recent labs reviewed. I have personally reviewed and have noted: 1) the patient's medical and social history 2) The patient's current medications and supplements 3) The patient's height, weight, and BMI have been recorded in the chart

## 2022-11-30 NOTE — Assessment & Plan Note (Signed)
Lab Results  Component Value Date   LDLCALC 85 11/20/2022   Stable, pt to continue current statin crestor 10 mg qd

## 2022-11-30 NOTE — Assessment & Plan Note (Signed)
With recent fx, for f/u DXA scan

## 2022-11-30 NOTE — Assessment & Plan Note (Signed)
Last vitamin D ?Lab Results  ?Component Value Date  ? VD25OH 57.29 11/20/2021  ? ?Stable, cont oral replacement ? ?

## 2022-12-02 ENCOUNTER — Telehealth: Payer: Self-pay

## 2022-12-02 NOTE — Telephone Encounter (Signed)
Due to recent DEXA results, PCP would like to start Prolia injections. Please initiate authorization process for Prolia and send results to "[facility] Nurse pool" Thanks!

## 2022-12-09 NOTE — Telephone Encounter (Signed)
Forwarding to Rx prior auth team 

## 2022-12-11 NOTE — Telephone Encounter (Signed)
Prolia VOB initiated via [email or web address] 

## 2022-12-16 DIAGNOSIS — M25562 Pain in left knee: Secondary | ICD-10-CM | POA: Diagnosis not present

## 2022-12-17 DIAGNOSIS — M6281 Muscle weakness (generalized): Secondary | ICD-10-CM | POA: Diagnosis not present

## 2022-12-17 DIAGNOSIS — M25662 Stiffness of left knee, not elsewhere classified: Secondary | ICD-10-CM | POA: Diagnosis not present

## 2022-12-17 DIAGNOSIS — M25462 Effusion, left knee: Secondary | ICD-10-CM | POA: Diagnosis not present

## 2022-12-20 DIAGNOSIS — M6281 Muscle weakness (generalized): Secondary | ICD-10-CM | POA: Diagnosis not present

## 2022-12-20 DIAGNOSIS — M25662 Stiffness of left knee, not elsewhere classified: Secondary | ICD-10-CM | POA: Diagnosis not present

## 2022-12-20 DIAGNOSIS — M25462 Effusion, left knee: Secondary | ICD-10-CM | POA: Diagnosis not present

## 2022-12-20 NOTE — Telephone Encounter (Signed)
PA submitted via [employer]  Authorization Number : [id number]

## 2022-12-24 ENCOUNTER — Telehealth: Payer: Self-pay | Admitting: Internal Medicine

## 2022-12-24 DIAGNOSIS — M25662 Stiffness of left knee, not elsewhere classified: Secondary | ICD-10-CM | POA: Diagnosis not present

## 2022-12-24 DIAGNOSIS — M6281 Muscle weakness (generalized): Secondary | ICD-10-CM | POA: Diagnosis not present

## 2022-12-24 DIAGNOSIS — M25462 Effusion, left knee: Secondary | ICD-10-CM | POA: Diagnosis not present

## 2022-12-24 NOTE — Telephone Encounter (Signed)
Patient Advocate Encounter  Prior Authorization for Jake Seats has been approved.    PA# [id number]  Effective dates: 12/20/22 through 12/20/23

## 2022-12-24 NOTE — Telephone Encounter (Signed)
Called pt gave her MD response. Pt states she is not sure if MD want her to get injection. Would like to make an appt first w/ MD. Made appt 12/30/22.Marland KitchenRaechel Chute  Patient Advocate Encounter  Prior Authorization for Jake Seats has been approved.    PA# [id number]  Effective dates: 12/20/22 through 12/20/23

## 2022-12-24 NOTE — Telephone Encounter (Signed)
Patient states that her insurance has approved her for Prolia - patient wants someone to call and tell her the next steps as far as getting the injection.  Patient's number:  [phone]

## 2022-12-24 NOTE — Telephone Encounter (Signed)
Ok to let pt know this - I am not sure of the next steps

## 2022-12-26 DIAGNOSIS — M25662 Stiffness of left knee, not elsewhere classified: Secondary | ICD-10-CM | POA: Diagnosis not present

## 2022-12-26 DIAGNOSIS — M25462 Effusion, left knee: Secondary | ICD-10-CM | POA: Diagnosis not present

## 2022-12-26 DIAGNOSIS — M6281 Muscle weakness (generalized): Secondary | ICD-10-CM | POA: Diagnosis not present

## 2022-12-30 ENCOUNTER — Ambulatory Visit (INDEPENDENT_AMBULATORY_CARE_PROVIDER_SITE_OTHER): Payer: Medicare HMO | Admitting: Internal Medicine

## 2022-12-30 VITALS — BP 130/78 | HR 64 | Temp 98.0°F | Ht 65.0 in | Wt 157.0 lb

## 2022-12-30 DIAGNOSIS — M810 Age-related osteoporosis without current pathological fracture: Secondary | ICD-10-CM | POA: Diagnosis not present

## 2022-12-30 DIAGNOSIS — E210 Primary hyperparathyroidism: Secondary | ICD-10-CM

## 2022-12-30 DIAGNOSIS — M25462 Effusion, left knee: Secondary | ICD-10-CM | POA: Diagnosis not present

## 2022-12-30 DIAGNOSIS — E7800 Pure hypercholesterolemia, unspecified: Secondary | ICD-10-CM | POA: Diagnosis not present

## 2022-12-30 DIAGNOSIS — M6281 Muscle weakness (generalized): Secondary | ICD-10-CM | POA: Diagnosis not present

## 2022-12-30 DIAGNOSIS — E559 Vitamin D deficiency, unspecified: Secondary | ICD-10-CM

## 2022-12-30 DIAGNOSIS — M25662 Stiffness of left knee, not elsewhere classified: Secondary | ICD-10-CM | POA: Diagnosis not present

## 2022-12-30 NOTE — Progress Notes (Unsigned)
Patient ID: Robyn Bailey, female   DOB: 11-12-1940, 82 y.o.   MRN: [id number]        Chief Complaint: follow up osteoporosis, hyperparathyroidism, hld, low vit d       HPI:  Robyn Bailey is a 82 y.o. female here after recent recommendation to start prolia but she has wavered after reading about potential side effect  Has $350 decuctible but possilby wiling to start.  Does have hx of hyperparathyroidism s/p surgury with improved PTH and calcium, also recent DXA with lowest t score -2.5 left femoral neck, so she is not sure she wants to start as things seem borderline and PTH level now improved. Also fall with leg fracture occurred from one time off balance fall at 4 ft off stairs she feels she can further avoid.    Pt denies chest pain, increased sob or doe, wheezing, orthopnea, PND, increased LE swelling, palpitations, dizziness or syncope.   Pt denies polydipsia, polyuria, or new focal neuro s/s.  Now starting PT at [facility] per Dr Jerl Santos. Wt Readings from Last 3 Encounters:  12/30/22 157 lb (71.2 kg)  11/27/22 157 lb 4 oz (71.3 kg)  09/25/22 155 lb (70.3 kg)   BP Readings from Last 3 Encounters:  12/30/22 130/78  11/27/22 136/82  09/25/22 139/85         Past Medical History:  Diagnosis Date   ABNORMAL ELECTROCARDIOGRAM 07/19/2007   ALLERGIC RHINITIS 07/19/2007   Anatomical narrow angle glaucoma 04/20/2009   ANXIETY 07/19/2007   BACK PAIN 11/08/2009   Blepharospasm 06/06/2008   COLONIC POLYPS, HX OF 07/19/2007   Cough 09/22/2007   DEPRESSION 07/19/2007   DIVERTICULOSIS, COLON 07/19/2007   Dyspnea    exertion   FATIGUE 04/20/2009   GERD (gastroesophageal reflux disease)    HYPERLIPIDEMIA 07/19/2007   Left sided sciatica 01/22/2011   LOW BACK PAIN 07/19/2007   OSTEOARTHRITIS 07/19/2007   OSTEOPOROSIS 07/19/2007   PERIPHERAL NEUROPATHY 06/08/2007   Ptosis of eyelid, bilateral    RHINITIS 02/27/2009   SINUSITIS- ACUTE-NOS 11/04/2007   SORE THROAT 02/27/2009   Past Surgical History:   Procedure Laterality Date   BREAST BIOPSY     CATARACT EXTRACTION, BILATERAL     COLONOSCOPY     EYE SURGERY     cataract   hammer toe surgury  2003   bilateral   PTOSIS REPAIR Bilateral 10/12/2018   Procedure: Bilateral ptosis eyelid correction external approach suture technique;  Surgeon: Glenna Fellows, MD;  Location: [facility];  Service: Plastics;  Laterality: Bilateral;   TONSILLECTOMY      reports that she has quit smoking. She has never used smokeless tobacco. She reports current alcohol use. She reports that she does not use drugs. family history includes Colon cancer in her maternal uncle and mother. No Known Allergies Current Outpatient Medications on File Prior to Visit  Medication Sig Dispense Refill   albuterol (VENTOLIN HFA) 108 (90 Base) MCG/ACT inhaler Inhale 2 puffs into the lungs every 6 (six) hours as needed for wheezing or shortness of breath. 8 g 3   cetirizine (ZYRTEC) 10 MG tablet Take by mouth.     Cholecalciferol (VITAMIN D) 10 MCG/ML LIQD Vitamin D     co-enzyme Q-10 30 MG capsule Take 30 mg by mouth 3 (three) times daily.     meloxicam (MOBIC) 15 MG tablet Take 1 tablet (15 mg total) by mouth daily as needed for pain. 90 tablet 1   MINOXIDIL, TOPICAL, 5 % SOLN Apply  1 application topically daily. Hair     pantoprazole (PROTONIX) 40 MG tablet TAKE ONE TABLET BY MOUTH TWICE A DAY 180 tablet 1   potassium gluconate 595 (99 K) MG TABS tablet Take 595 mg by mouth 3 (three) times a week.     Probiotic Product (ALIGN) 4 MG CAPS Take 4 mg by mouth See admin instructions. Every other month     rosuvastatin (CRESTOR) 10 MG tablet Take 1 tablet by mouth three times a week 12 tablet 0   triamcinolone (NASACORT AQ) 55 MCG/ACT AERO nasal inhaler Place 2 sprays into the nose daily. (Patient taking differently: Place 2 sprays into the nose daily as needed (congestion).) 1 Inhaler 12   Urea 20 20 % LOTN 1 application as needed Externally Once a day      Vitamin D3 (VITAMIN D) 25 MCG tablet Take 1,000 Units by mouth daily.     No current facility-administered medications on file prior to visit.        ROS:  All others reviewed and negative.  Objective        PE:  BP 130/78   Pulse 64   Temp 98 F (36.7 C) (Oral)   Ht 5\' 5"  (1.651 m)   Wt 157 lb (71.2 kg)   SpO2 98%   BMI 26.13 kg/m                 Constitutional: Pt appears in NAD               HENT: Head: NCAT.                Right Ear: External ear normal.                 Left Ear: External ear normal.                Eyes: . Pupils are equal, round, and reactive to light. Conjunctivae and EOM are normal               Nose: without d/c or deformity               Neck: Neck supple. Gross normal ROM               Cardiovascular: Normal rate and regular rhythm.                 Pulmonary/Chest: Effort normal and breath sounds without rales or wheezing.                Abd:  Soft, NT, ND, + BS, no organomegaly               Neurological: Pt is alert. At baseline orientation, motor grossly intact               Skin: Skin is warm. No rashes, no other new lesions, LE edema - none               Psychiatric: Pt behavior is normal without agitation   Micro: none  Cardiac tracings I have personally interpreted today:  none  Pertinent Radiological findings (summarize): none   Lab Results  Component Value Date   WBC 4.9 11/20/2022   HGB 14.3 11/20/2022   HCT 42.3 11/20/2022   PLT 207.0 11/20/2022   GLUCOSE 79 11/20/2022   CHOL 179 11/20/2022   TRIG 97.0 11/20/2022   HDL 75.30 11/20/2022   LDLDIRECT 134.0 10/20/2013   LDLCALC 85 11/20/2022   ALT  18 11/20/2022   AST 20 11/20/2022   NA 140 11/20/2022   K 3.6 11/20/2022   CL 104 11/20/2022   CREATININE 0.73 11/20/2022   BUN 25 (H) 11/20/2022   CO2 28 11/20/2022   TSH 1.53 11/20/2022   HGBA1C 5.3 11/20/2022   MICROALBUR <0.7 11/20/2022   Assessment/Plan:  Robyn Bailey is a 82 y.o. White or Caucasian [1] female with  has  a past medical history of ABNORMAL ELECTROCARDIOGRAM (07/19/2007), ALLERGIC RHINITIS (07/19/2007), Anatomical narrow angle glaucoma (04/20/2009), ANXIETY (07/19/2007), BACK PAIN (11/08/2009), Blepharospasm (06/06/2008), COLONIC POLYPS, HX OF (07/19/2007), Cough (09/22/2007), DEPRESSION (07/19/2007), DIVERTICULOSIS, COLON (07/19/2007), Dyspnea, FATIGUE (04/20/2009), GERD (gastroesophageal reflux disease), HYPERLIPIDEMIA (07/19/2007), Left sided sciatica (01/22/2011), LOW BACK PAIN (07/19/2007), OSTEOARTHRITIS (07/19/2007), OSTEOPOROSIS (07/19/2007), PERIPHERAL NEUROPATHY (06/08/2007), Ptosis of eyelid, bilateral, RHINITIS (02/27/2009), SINUSITIS- ACUTE-NOS (11/04/2007), and SORE THROAT (02/27/2009).  Age-related osteoporosis without current pathological fracture Mild uncontrolled, after discussions she elects to hold on prolia for now, continue calium and vit D supplement nand f/u DXA at one year  Hyperparathyroidism, primary [facility]) Lab Results  Component Value Date   PTH 27 11/20/2022   CALCIUM 9.3 11/20/2022   CALCIUM 9.2 11/20/2022   Improved, cont to follow  HLD (hyperlipidemia) Lab Results  Component Value Date   LDLCALC 85 11/20/2022   Stable, pt to continue current statin crestor 10 mg qd   Vitamin D insufficiency Last vitamin D Lab Results  Component Value Date   VD25OH 57.29 11/20/2021   Stable, cont oral replacement  Followup: Return if symptoms worsen or fail to improve.  Oliver Barre, MD 12/31/2022 8:31 PM [facility]

## 2022-12-30 NOTE — Patient Instructions (Signed)
Ok to hold on the Prolia for now  We will plan to repeat the Bone Density testing in 1 year  Please continue all other medications as before, and refills have been done if requested.  Please have the pharmacy call with any other refills you may need.  Please keep your appointments with your specialists as you may have planned

## 2022-12-31 ENCOUNTER — Encounter: Payer: Self-pay | Admitting: Internal Medicine

## 2022-12-31 NOTE — Assessment & Plan Note (Signed)
Lab Results  Component Value Date   LDLCALC 85 11/20/2022   Stable, pt to continue current statin crestor 10 mg qd  

## 2022-12-31 NOTE — Assessment & Plan Note (Signed)
Mild uncontrolled, after discussions she elects to hold on prolia for now, continue calium and vit D supplement nand f/u DXA at one year

## 2022-12-31 NOTE — Assessment & Plan Note (Signed)
Lab Results  Component Value Date   PTH 27 11/20/2022   CALCIUM 9.3 11/20/2022   CALCIUM 9.2 11/20/2022   Improved, cont to follow

## 2022-12-31 NOTE — Assessment & Plan Note (Signed)
Last vitamin D ?Lab Results  ?Component Value Date  ? VD25OH 57.29 11/20/2021  ? ?Stable, cont oral replacement ? ?

## 2023-01-02 DIAGNOSIS — M25462 Effusion, left knee: Secondary | ICD-10-CM | POA: Diagnosis not present

## 2023-01-02 DIAGNOSIS — M25662 Stiffness of left knee, not elsewhere classified: Secondary | ICD-10-CM | POA: Diagnosis not present

## 2023-01-02 DIAGNOSIS — M6281 Muscle weakness (generalized): Secondary | ICD-10-CM | POA: Diagnosis not present

## 2023-01-07 DIAGNOSIS — M25462 Effusion, left knee: Secondary | ICD-10-CM | POA: Diagnosis not present

## 2023-01-07 DIAGNOSIS — M6281 Muscle weakness (generalized): Secondary | ICD-10-CM | POA: Diagnosis not present

## 2023-01-07 DIAGNOSIS — M25662 Stiffness of left knee, not elsewhere classified: Secondary | ICD-10-CM | POA: Diagnosis not present

## 2023-01-09 DIAGNOSIS — M6281 Muscle weakness (generalized): Secondary | ICD-10-CM | POA: Diagnosis not present

## 2023-01-09 DIAGNOSIS — M25662 Stiffness of left knee, not elsewhere classified: Secondary | ICD-10-CM | POA: Diagnosis not present

## 2023-01-09 DIAGNOSIS — M25462 Effusion, left knee: Secondary | ICD-10-CM | POA: Diagnosis not present

## 2023-01-13 DIAGNOSIS — Z961 Presence of intraocular lens: Secondary | ICD-10-CM | POA: Diagnosis not present

## 2023-01-13 DIAGNOSIS — H52203 Unspecified astigmatism, bilateral: Secondary | ICD-10-CM | POA: Diagnosis not present

## 2023-01-13 DIAGNOSIS — H524 Presbyopia: Secondary | ICD-10-CM | POA: Diagnosis not present

## 2023-01-14 DIAGNOSIS — M25462 Effusion, left knee: Secondary | ICD-10-CM | POA: Diagnosis not present

## 2023-01-14 DIAGNOSIS — M6281 Muscle weakness (generalized): Secondary | ICD-10-CM | POA: Diagnosis not present

## 2023-01-14 DIAGNOSIS — M25662 Stiffness of left knee, not elsewhere classified: Secondary | ICD-10-CM | POA: Diagnosis not present

## 2023-01-15 DIAGNOSIS — M25562 Pain in left knee: Secondary | ICD-10-CM | POA: Diagnosis not present

## 2023-01-16 DIAGNOSIS — M25662 Stiffness of left knee, not elsewhere classified: Secondary | ICD-10-CM | POA: Diagnosis not present

## 2023-01-16 DIAGNOSIS — M6281 Muscle weakness (generalized): Secondary | ICD-10-CM | POA: Diagnosis not present

## 2023-01-16 DIAGNOSIS — M25462 Effusion, left knee: Secondary | ICD-10-CM | POA: Diagnosis not present

## 2023-01-24 DIAGNOSIS — M25662 Stiffness of left knee, not elsewhere classified: Secondary | ICD-10-CM | POA: Diagnosis not present

## 2023-01-24 DIAGNOSIS — M6281 Muscle weakness (generalized): Secondary | ICD-10-CM | POA: Diagnosis not present

## 2023-01-24 DIAGNOSIS — M25462 Effusion, left knee: Secondary | ICD-10-CM | POA: Diagnosis not present

## 2023-01-29 DIAGNOSIS — M25662 Stiffness of left knee, not elsewhere classified: Secondary | ICD-10-CM | POA: Diagnosis not present

## 2023-01-29 DIAGNOSIS — M6281 Muscle weakness (generalized): Secondary | ICD-10-CM | POA: Diagnosis not present

## 2023-01-29 DIAGNOSIS — M25462 Effusion, left knee: Secondary | ICD-10-CM | POA: Diagnosis not present

## 2023-02-05 DIAGNOSIS — M6281 Muscle weakness (generalized): Secondary | ICD-10-CM | POA: Diagnosis not present

## 2023-02-05 DIAGNOSIS — M25462 Effusion, left knee: Secondary | ICD-10-CM | POA: Diagnosis not present

## 2023-02-05 DIAGNOSIS — M25662 Stiffness of left knee, not elsewhere classified: Secondary | ICD-10-CM | POA: Diagnosis not present

## 2023-02-12 DIAGNOSIS — M6281 Muscle weakness (generalized): Secondary | ICD-10-CM | POA: Diagnosis not present

## 2023-02-12 DIAGNOSIS — M25662 Stiffness of left knee, not elsewhere classified: Secondary | ICD-10-CM | POA: Diagnosis not present

## 2023-02-12 DIAGNOSIS — M25462 Effusion, left knee: Secondary | ICD-10-CM | POA: Diagnosis not present

## 2023-02-20 ENCOUNTER — Other Ambulatory Visit: Payer: Self-pay | Admitting: Internal Medicine

## 2023-02-20 ENCOUNTER — Other Ambulatory Visit: Payer: Self-pay

## 2023-02-25 DIAGNOSIS — I129 Hypertensive chronic kidney disease with stage 1 through stage 4 chronic kidney disease, or unspecified chronic kidney disease: Secondary | ICD-10-CM | POA: Diagnosis not present

## 2023-02-25 DIAGNOSIS — E890 Postprocedural hypothyroidism: Secondary | ICD-10-CM | POA: Diagnosis not present

## 2023-02-25 DIAGNOSIS — K219 Gastro-esophageal reflux disease without esophagitis: Secondary | ICD-10-CM | POA: Diagnosis not present

## 2023-02-25 DIAGNOSIS — R32 Unspecified urinary incontinence: Secondary | ICD-10-CM | POA: Diagnosis not present

## 2023-02-25 DIAGNOSIS — M1990 Unspecified osteoarthritis, unspecified site: Secondary | ICD-10-CM | POA: Diagnosis not present

## 2023-02-25 DIAGNOSIS — Z791 Long term (current) use of non-steroidal anti-inflammatories (NSAID): Secondary | ICD-10-CM | POA: Diagnosis not present

## 2023-02-25 DIAGNOSIS — Z961 Presence of intraocular lens: Secondary | ICD-10-CM | POA: Diagnosis not present

## 2023-02-25 DIAGNOSIS — F324 Major depressive disorder, single episode, in partial remission: Secondary | ICD-10-CM | POA: Diagnosis not present

## 2023-02-25 DIAGNOSIS — E785 Hyperlipidemia, unspecified: Secondary | ICD-10-CM | POA: Diagnosis not present

## 2023-02-25 DIAGNOSIS — M810 Age-related osteoporosis without current pathological fracture: Secondary | ICD-10-CM | POA: Diagnosis not present

## 2023-02-25 DIAGNOSIS — N189 Chronic kidney disease, unspecified: Secondary | ICD-10-CM | POA: Diagnosis not present

## 2023-02-25 DIAGNOSIS — G629 Polyneuropathy, unspecified: Secondary | ICD-10-CM | POA: Diagnosis not present

## 2023-03-25 ENCOUNTER — Ambulatory Visit (INDEPENDENT_AMBULATORY_CARE_PROVIDER_SITE_OTHER): Payer: Medicare HMO | Admitting: Emergency Medicine

## 2023-03-25 ENCOUNTER — Encounter: Payer: Self-pay | Admitting: Emergency Medicine

## 2023-03-25 VITALS — BP 118/78 | HR 70 | Temp 98.1°F | Ht 65.0 in | Wt 155.5 lb

## 2023-03-25 DIAGNOSIS — F418 Other specified anxiety disorders: Secondary | ICD-10-CM | POA: Diagnosis not present

## 2023-03-25 DIAGNOSIS — K5904 Chronic idiopathic constipation: Secondary | ICD-10-CM | POA: Insufficient documentation

## 2023-03-25 MED ORDER — TRULANCE 3 MG PO TABS: 3.0000 mg | ORAL_TABLET | Freq: Every day | ORAL | 1 refills | Status: DC

## 2023-03-25 NOTE — Patient Instructions (Signed)

## 2023-03-25 NOTE — Assessment & Plan Note (Signed)
Unresponsive to over-the-counter constipation remedies Clinically stable.  Benign physical exam.  No red flag signs or symptoms.  Able to eat and drink.  No nausea or vomiting. Tried MiraLAX, fiber supplements, Colace with no relief History of diverticulosis.  Do not recommend enemas. Advised to stay well-hydrated and continue fiber supplementation At this point recommend to use Trulance 3 mg daily for 2 weeks

## 2023-03-25 NOTE — Progress Notes (Signed)
Robyn Bailey 82 y.o.   Chief Complaint  Patient presents with   Constipation    Patient states she has been constipated for about month,, patient states the bowel movements are really hard, She states she has taken miralax, colace,     HISTORY OF PRESENT ILLNESS: Acute problem visit today.  Patient of Dr. Oliver Barre. This is a 82 y.o. female complaining of constipation for over 1 month unresponsive to over-the-counter medications. Also complaining of situational anxiety triggered by close friend diagnosed with terminal illness.  Constipation Pertinent negatives include no abdominal pain, fever, nausea or vomiting.     Prior to Admission medications   Medication Sig Start Date End Date Taking? Authorizing Provider  albuterol (VENTOLIN HFA) 108 (90 Base) MCG/ACT inhaler Inhale 2 puffs into the lungs every 6 (six) hours as needed for wheezing or shortness of breath. 06/04/22   Corwin Levins, MD  cetirizine (ZYRTEC) 10 MG tablet Take by mouth. 09/15/15   [provider]  Cholecalciferol (VITAMIN D) 10 MCG/ML LIQD Vitamin D    [provider]  co-enzyme Q-10 30 MG capsule Take 30 mg by mouth 3 (three) times daily.    [provider]  meloxicam (MOBIC) 15 MG tablet Take 1 tablet (15 mg total) by mouth daily as needed for pain. 11/27/22   Corwin Levins, MD  MINOXIDIL, TOPICAL, 5 % SOLN Apply 1 application topically daily. Hair    [provider]  pantoprazole (PROTONIX) 40 MG tablet TAKE 1 TABLET BY MOUTH TWICE A DAY 02/20/23   Corwin Levins, MD  potassium gluconate 595 (99 K) MG TABS tablet Take 595 mg by mouth 3 (three) times a week.    [provider]  Probiotic Product (ALIGN) 4 MG CAPS Take 4 mg by mouth See admin instructions. Every other month    [provider]  rosuvastatin (CRESTOR) 10 MG tablet Take 1 tablet by mouth three times a week 10/04/22   Corwin Levins, MD  triamcinolone (NASACORT AQ) 55 MCG/ACT AERO nasal inhaler Place 2  sprays into the nose daily. Patient taking differently: Place 2 sprays into the nose daily as needed (congestion). 11/08/16   Corwin Levins, MD  Urea 20 20 % LOTN 1 application as needed Externally Once a day 02/03/19   [provider]  Vitamin D3 (VITAMIN D) 25 MCG tablet Take 1,000 Units by mouth daily.    [provider]    No Known Allergies  Patient Active Problem List   Diagnosis Date Noted   Tibial plateau fracture, unspecified laterality, closed, initial encounter 09/25/2022   Contusion of left knee 08/20/2022   Left knee pain 08/06/2022   Pain and swelling of left lower leg 08/06/2022   Dyspnea 06/04/2022   Mild AI (aortic insufficiency) 06/04/2022   Chest pressure 06/04/2022   Hammertoes of both feet 09/14/2021   Right sided sciatica 09/11/2020   Muscle cramps 08/29/2020   Mild peripheral edema 02/29/2020   Piriformis syndrome of right side 12/13/2019   Leg pain, lateral, right 11/17/2019   Hyperparathyroidism, primary (HCC) 12/21/2018   Hypercalcemia 12/21/2018   Vitamin D insufficiency 12/21/2018   Age-related osteoporosis without current pathological fracture 12/21/2018   Ptosis of eyelid, bilateral 10/12/2018   Ptosis 02/12/2018   Palpitations 11/12/2017   Radiculitis of right cervical region 11/11/2017   Eustachian tube dysfunction 11/08/2016   Tinnitus of left ear 09/15/2015   Insect stings 04/22/2013   Rash and nonspecific skin eruption 04/22/2013  GERD (gastroesophageal reflux disease) 09/28/2012   Left lumbar radiculopathy 02/01/2011   Left sided sciatica 01/22/2011   Encounter for well adult exam with abnormal findings 01/21/2011   Fatigue 04/20/2009   Acute angle-closure glaucoma 04/20/2009   Blepharospasm 06/06/2008   HLD (hyperlipidemia) 07/19/2007   Allergic rhinitis 07/19/2007   OSTEOARTHRITIS 07/19/2007   LOW BACK PAIN 07/19/2007   ABNORMAL ELECTROCARDIOGRAM 07/19/2007   Anxiety state 07/19/2007   Depression 07/19/2007    Diverticulosis of colon 07/19/2007   PERIPHERAL NEUROPATHY 06/08/2007    Past Medical History:  Diagnosis Date   ABNORMAL ELECTROCARDIOGRAM 07/19/2007   ALLERGIC RHINITIS 07/19/2007   Anatomical narrow angle glaucoma 04/20/2009   ANXIETY 07/19/2007   BACK PAIN 11/08/2009   Blepharospasm 06/06/2008   COLONIC POLYPS, HX OF 07/19/2007   Cough 09/22/2007   DEPRESSION 07/19/2007   DIVERTICULOSIS, COLON 07/19/2007   Dyspnea    exertion   FATIGUE 04/20/2009   GERD (gastroesophageal reflux disease)    HYPERLIPIDEMIA 07/19/2007   Left sided sciatica 01/22/2011   LOW BACK PAIN 07/19/2007   OSTEOARTHRITIS 07/19/2007   OSTEOPOROSIS 07/19/2007   PERIPHERAL NEUROPATHY 06/08/2007   Ptosis of eyelid, bilateral    RHINITIS 02/27/2009   SINUSITIS- ACUTE-NOS 11/04/2007   SORE THROAT 02/27/2009    Past Surgical History:  Procedure Laterality Date   BREAST BIOPSY     CATARACT EXTRACTION, BILATERAL     COLONOSCOPY     EYE SURGERY     cataract   hammer toe surgury  2003   bilateral   PTOSIS REPAIR Bilateral 10/12/2018   Procedure: Bilateral ptosis eyelid correction external approach suture technique;  Surgeon: Glenna Fellows, MD;  Location: [facility];  Service: Plastics;  Laterality: Bilateral;   TONSILLECTOMY      Social History   Socioeconomic History   Marital status: Single    Spouse name: Not on file   Number of children: Not on file   Years of education: Not on file   Highest education level: Not on file  Occupational History   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, nutritional   Occupation: part time research legal  Tobacco Use   Smoking status: Former   Smokeless tobacco: Never  Building services engineer Use: Never used  Substance and Sexual Activity   Alcohol use: Yes    Alcohol/week: 0.0 standard drinks of alcohol    Comment: occasional glass of wine; rare now b/c acid reflux   Drug use: No   Sexual activity: Not Currently    Birth control/protection:  Post-menopausal  Other Topics Concern   Not on file  Social History Narrative   Not on file   Social Determinants of Health   Financial Resource Strain: Low Risk  (11/22/2021)   Overall Financial Resource Strain (CARDIA)    Difficulty of Paying Living Expenses: Not hard at all  Food Insecurity: No Food Insecurity (03/20/2022)   Hunger Vital Sign    Worried About Running Out of Food in the Last Year: Never true    Ran Out of Food in the Last Year: Never true  Transportation Needs: No Transportation Needs (03/20/2022)   PRAPARE - Administrator, Civil Service (Medical): No    Lack of Transportation (Non-Medical): No  Physical Activity: Inactive (11/22/2021)   Exercise Vital Sign    Days of Exercise per Week: 0 days    Minutes of Exercise per Session: 0 min  Stress: No Stress Concern Present (11/22/2021)   [employer]  of Occupational Health - Occupational Stress Questionnaire    Feeling of Stress : Not at all  Social Connections: Moderately Integrated (11/22/2021)   Social Connection and Isolation Panel [NHANES]    Frequency of Communication with Friends and Family: More than three times a week    Frequency of Social Gatherings with Friends and Family: More than three times a week    Attends Religious Services: More than 4 times per year    Active Member of [employer] or Organizations: Yes    Attends Engineer, structural: More than 4 times per year    Marital Status: Never married  Intimate Partner Violence: Not At Risk (11/22/2021)   Humiliation, Afraid, Rape, and Kick questionnaire    Fear of Current or Ex-Partner: No    Emotionally Abused: No    Physically Abused: No    Sexually Abused: No    Family History  Problem Relation Age of Onset   Colon cancer Mother    Colon cancer Maternal Uncle    Esophageal cancer Neg Hx    Rectal cancer Neg Hx    Stomach cancer Neg Hx      Review of Systems  Constitutional: Negative.  Negative for chills and fever.  HENT:  Negative.  Negative for congestion and sore throat.   Respiratory: Negative.  Negative for cough and shortness of breath.   Cardiovascular: Negative.  Negative for chest pain and palpitations.  Gastrointestinal:  Positive for constipation. Negative for abdominal pain, nausea and vomiting.  Genitourinary: Negative.   Neurological: Negative.  Negative for dizziness and headaches.  All other systems reviewed and are negative.   Today's Vitals   03/25/23 1338  BP: 118/78  Pulse: 70  Temp: 98.1 F (36.7 C)  TempSrc: Oral  SpO2: 98%  Weight: 155 lb 8 oz (70.5 kg)  Height: 5\' 5"  (1.651 m)   Body mass index is 25.88 kg/m.   Physical Exam Vitals reviewed.  Constitutional:      Appearance: Normal appearance.  HENT:     Head: Normocephalic.  Eyes:     Extraocular Movements: Extraocular movements intact.  Cardiovascular:     Rate and Rhythm: Normal rate.  Pulmonary:     Effort: Pulmonary effort is normal.  Abdominal:     Palpations: Abdomen is soft.     Tenderness: There is no abdominal tenderness.  Skin:    General: Skin is warm and dry.     Capillary Refill: Capillary refill takes less than 2 seconds.  Neurological:     Mental Status: She is alert and oriented to person, place, and time.  Psychiatric:        Mood and Affect: Mood normal.        Behavior: Behavior normal.      ASSESSMENT & PLAN: A total of 42 minutes was spent with the patient and counseling/coordination of care regarding preparing for this visit, review of most recent office visit notes, review of multiple chronic medical conditions under management, review of all medications, diagnosis and treatment of chronic constipation, education on nutrition, stress management, prognosis, documentation, and need for follow-up.  Problem List Items Addressed This Visit       Digestive   Chronic idiopathic constipation - Primary    Unresponsive to over-the-counter constipation remedies Clinically stable.  Benign  physical exam.  No red flag signs or symptoms.  Able to eat and drink.  No nausea or vomiting. Tried MiraLAX, fiber supplements, Colace with no relief History of diverticulosis.  Do  not recommend enemas. Advised to stay well-hydrated and continue fiber supplementation At this point recommend to use Trulance 3 mg daily for 2 weeks        Other   Situational anxiety    Active and affecting quality of life.  Triggered by sick close friend diagnosed with terminal illness. Stress management discussed.      Patient Instructions  Constipation, Adult Constipation is when a person has trouble pooping (having a bowel movement). When you have this condition, you may poop fewer than 3 times a week. Your poop (stool) may also be dry, hard, or bigger than normal. Follow these instructions at home: Eating and drinking  Eat foods that have a lot of fiber, such as: Fresh fruits and vegetables. Whole grains. Beans. Eat less of foods that are low in fiber and high in fat and sugar, such as: [place] fries. Hamburgers. Cookies. Candy. Soda. Drink enough fluid to keep your pee (urine) pale yellow. General instructions Exercise regularly or as told by your doctor. Try to do 150 minutes of exercise each week. Go to the restroom when you feel like you need to poop. Do not hold it in. Take over-the-counter and prescription medicines only as told by your doctor. These include any fiber supplements. When you poop: Do deep breathing while relaxing your lower belly (abdomen). Relax your pelvic floor. The pelvic floor is a group of muscles that support the rectum, bladder, and intestines (as well as the uterus in women). Watch your condition for any changes. Tell your doctor if you notice any. Keep all follow-up visits as told by your doctor. This is important. Contact a doctor if: You have pain that gets worse. You have a fever. You have not pooped for 4 days. You vomit. You are not hungry. You lose  weight. You are bleeding from the opening of the butt (anus). You have thin, pencil-like poop. Get help right away if: You have a fever, and your symptoms suddenly get worse. You leak poop or have blood in your poop. Your belly feels hard or bigger than normal (bloated). You have very bad belly pain. You feel dizzy or you faint. Summary Constipation is when a person poops fewer than 3 times a week, has trouble pooping, or has poop that is dry, hard, or bigger than normal. Eat foods that have a lot of fiber. Drink enough fluid to keep your pee (urine) pale yellow. Take over-the-counter and prescription medicines only as told by your doctor. These include any fiber supplements. This information is not intended to replace advice given to you by your health care provider. Make sure you discuss any questions you have with your health care provider. Document Revised: 07/17/2022 Document Reviewed: 07/17/2022 Elsevier Patient Education  2024 Elsevier Inc.    Edwina Barth, MD [facility]

## 2023-03-25 NOTE — Assessment & Plan Note (Signed)
Active and affecting quality of life.  Triggered by sick close friend diagnosed with terminal illness. Stress management discussed.

## 2023-03-26 ENCOUNTER — Telehealth: Payer: Self-pay | Admitting: Internal Medicine

## 2023-03-26 NOTE — Telephone Encounter (Signed)
Patient called and said she was prescribed Plecanatide (TRULANCE) 3 MG TABS by Dr. Alvy Bimler. She said her insurance doesn't cover it and it's too expensive. She would like to know if there is anything else he would recommend. Patient would like a call back at [phone].

## 2023-03-27 ENCOUNTER — Other Ambulatory Visit: Payer: Self-pay | Admitting: Emergency Medicine

## 2023-03-27 MED ORDER — LINACLOTIDE 145 MCG PO CAPS: 145.0000 ug | ORAL_CAPSULE | Freq: Every day | ORAL | 1 refills | Status: DC

## 2023-03-27 NOTE — Telephone Encounter (Signed)
Called pt inform MD request. We had sone Linzess samples that she is going to pick-up tomorrow..;l,mb

## 2023-03-27 NOTE — Telephone Encounter (Signed)
She can come to the office and pick up samples of either Trulance or Linzess 145 mg if we have any. New prescription for Linzess sent to her pharmacy but I think it will be the same issue.  Thanks.

## 2023-04-04 ENCOUNTER — Other Ambulatory Visit (HOSPITAL_COMMUNITY): Payer: Self-pay

## 2023-04-10 ENCOUNTER — Ambulatory Visit (INDEPENDENT_AMBULATORY_CARE_PROVIDER_SITE_OTHER): Payer: Medicare HMO

## 2023-04-10 VITALS — BP 120/82 | Ht 65.5 in | Wt 156.8 lb

## 2023-04-10 DIAGNOSIS — Z0000 Encounter for general adult medical examination without abnormal findings: Secondary | ICD-10-CM

## 2023-04-10 NOTE — Progress Notes (Signed)
Subjective:   Robyn Bailey is a 82 y.o. female who presents for Medicare Annual (Subsequent) preventive examination.  Visit Complete: In person   Review of Systems    Cardiac Risk Factors include: advanced age (>67men, >44 women);dyslipidemia;Other (see comment), Risk factor comments: Osteoporosis, Mild Al     Objective:    Today's Vitals   04/10/23 0900  BP: 120/82  Weight: 156 lb 12.8 oz (71.1 kg)  Height: 5' 5.5" (1.664 m)   Body mass index is 25.7 kg/m.     04/10/2023    9:18 AM 11/22/2021   10:26 AM 02/02/2021   10:38 AM 09/26/2020    1:47 PM 11/12/2018   10:31 AM 10/02/2018   12:23 PM 05/28/2018   11:41 AM  Advanced Directives  Does Patient Have a Medical Advance Directive? Yes Yes Yes Yes Yes Yes Yes  Type of Estate agent of [place];Living will Living will;Healthcare Power of [employer] Power of [place];Living will Living will Healthcare Power of [place];Living will Healthcare Power of [place];Living will Healthcare Power of [employer];Living will  Does patient want to make changes to medical advance directive?  No - Patient declined  No - Patient declined  No - Patient declined   Copy of Healthcare Power of Attorney in Chart? No - copy requested No - copy requested   No - copy requested      Current Medications (verified) Outpatient Encounter Medications as of 04/10/2023  Medication Sig   albuterol (VENTOLIN HFA) 108 (90 Base) MCG/ACT inhaler Inhale 2 puffs into the lungs every 6 (six) hours as needed for wheezing or shortness of breath.   cetirizine (ZYRTEC) 10 MG tablet Take by mouth.   Cholecalciferol (VITAMIN D) 10 MCG/ML LIQD Vitamin D   co-enzyme Q-10 30 MG capsule Take 30 mg by mouth 3 (three) times daily.   linaclotide (LINZESS) 145 MCG CAPS capsule Take 1 capsule (145 mcg total) by mouth daily before breakfast.   meloxicam (MOBIC) 15 MG tablet Take 1 tablet (15 mg total) by mouth daily as needed for pain.   MINOXIDIL,  TOPICAL, 5 % SOLN Apply 1 application topically daily. Hair   pantoprazole (PROTONIX) 40 MG tablet TAKE 1 TABLET BY MOUTH TWICE A DAY   potassium gluconate 595 (99 K) MG TABS tablet Take 595 mg by mouth 3 (three) times a week.   Probiotic Product (ALIGN) 4 MG CAPS Take 4 mg by mouth See admin instructions. Every other month   rosuvastatin (CRESTOR) 10 MG tablet Take 1 tablet by mouth three times a week   triamcinolone (NASACORT AQ) 55 MCG/ACT AERO nasal inhaler Place 2 sprays into the nose daily. (Patient taking differently: Place 2 sprays into the nose daily as needed (congestion).)   Urea 20 20 % LOTN 1 application as needed Externally Once a day   Vitamin D3 (VITAMIN D) 25 MCG tablet Take 1,000 Units by mouth daily.   No facility-administered encounter medications on file as of 04/10/2023.    Allergies (verified) Patient has no known allergies.   History: Past Medical History:  Diagnosis Date   ABNORMAL ELECTROCARDIOGRAM 07/19/2007   ALLERGIC RHINITIS 07/19/2007   Anatomical narrow angle glaucoma 04/20/2009   ANXIETY 07/19/2007   BACK PAIN 11/08/2009   Blepharospasm 06/06/2008   COLONIC POLYPS, HX OF 07/19/2007   Cough 09/22/2007   DEPRESSION 07/19/2007   DIVERTICULOSIS, COLON 07/19/2007   Dyspnea    exertion   FATIGUE 04/20/2009   GERD (gastroesophageal reflux disease)    HYPERLIPIDEMIA 07/19/2007  Left sided sciatica 01/22/2011   LOW BACK PAIN 07/19/2007   OSTEOARTHRITIS 07/19/2007   OSTEOPOROSIS 07/19/2007   PERIPHERAL NEUROPATHY 06/08/2007   Ptosis of eyelid, bilateral    RHINITIS 02/27/2009   SINUSITIS- ACUTE-NOS 11/04/2007   SORE THROAT 02/27/2009   Past Surgical History:  Procedure Laterality Date   BREAST BIOPSY     CATARACT EXTRACTION, BILATERAL     COLONOSCOPY     EYE SURGERY     cataract   hammer toe surgury  2003   bilateral   PTOSIS REPAIR Bilateral 10/12/2018   Procedure: Bilateral ptosis eyelid correction external approach suture technique;  Surgeon: Glenna Fellows,  MD;  Location: [facility];  Service: Plastics;  Laterality: Bilateral;   TONSILLECTOMY     Family History  Problem Relation Age of Onset   Colon cancer Mother    Colon cancer Maternal Uncle    Esophageal cancer Neg Hx    Rectal cancer Neg Hx    Stomach cancer Neg Hx    Social History   Socioeconomic History   Marital status: Single    Spouse name: Not on file   Number of children: Not on file   Years of education: Not on file   Highest education level: Not on file  Occupational History   Occupation: retired  Tobacco Use   Smoking status: Former   Smokeless tobacco: Never  Advertising account planner   Vaping status: Never Used  Substance and Sexual Activity   Alcohol use: Not Currently    Comment: occasional glass of wine; rare now b/c acid reflux   Drug use: No   Sexual activity: Not Currently    Birth control/protection: Post-menopausal  Other Topics Concern   Not on file  Social History Narrative   Not on file   Social Determinants of Health   Financial Resource Strain: Low Risk  (04/10/2023)   Overall Financial Resource Strain (CARDIA)    Difficulty of Paying Living Expenses: Not hard at all  Food Insecurity: No Food Insecurity (04/10/2023)   Hunger Vital Sign    Worried About Running Out of Food in the Last Year: Never true    Ran Out of Food in the Last Year: Never true  Transportation Needs: No Transportation Needs (04/10/2023)   PRAPARE - Administrator, Civil Service (Medical): No    Lack of Transportation (Non-Medical): No  Physical Activity: Insufficiently Active (04/10/2023)   Exercise Vital Sign    Days of Exercise per Week: 5 days    Minutes of Exercise per Session: 20 min  Stress: No Stress Concern Present (04/10/2023)   [employer] of Occupational Health - Occupational Stress Questionnaire    Feeling of Stress : Not at all  Social Connections: Socially Isolated (04/10/2023)   Social Connection and Isolation Panel [NHANES]     Frequency of Communication with Friends and Family: More than three times a week    Frequency of Social Gatherings with Friends and Family: Once a week    Attends Religious Services: Never    Database administrator or Organizations: No    Attends Engineer, structural: Never    Marital Status: Never married    Tobacco Counseling Counseling given: Not Answered   Clinical Intake:  Pre-visit preparation completed: Yes        BMI - recorded: 25.7 Nutritional Status: BMI 25 -29 Overweight Nutritional Risks: Nausea/ vomitting/ diarrhea (had diarrhea just a couple of days ago) Diabetes: No  How often do  you need to have someone help you when you read instructions, pamphlets, or other written materials from your doctor or pharmacy?: 1 - Never     Information entered by ::  , RMA   Activities of Daily Living    04/10/2023    8:59 AM  In your present state of health, do you have any difficulty performing the following activities:  Hearing? 0  Vision? 0  Difficulty concentrating or making decisions? 0  Walking or climbing stairs? 1  Comment Going down stairs  Dressing or bathing? 0  Doing errands, shopping? 0  Preparing Food and eating ? N  Using the Toilet? N  In the past six months, have you accidently leaked urine? Y  Comment when she coughs  Do you have problems with loss of bowel control? N  Managing your Medications? N  Managing your Finances? N    Patient Care Team: Corwin Levins, MD as PCP - Sonia Side, MD as Consulting Physician (Plastic Surgery) Antony Contras, MD as Consulting Physician (Ophthalmology)  Indicate any recent Medical Services you may have received from other than [facility] providers in the past year (date may be approximate).     Assessment:   This is a routine wellness examination for Madolyn.  Hearing/Vision screen Hearing Screening - Comments:: Denies hearing difficulties   Vision Screening - Comments::  Wears eyeglasses  Dietary issues and exercise activities discussed:     Goals Addressed               This Visit's Progress     Client understands the importance of follow-up with providers by attending scheduled visits (pt-stated)   On track     My goal is to get back in the gym this month and try to lose at least 10 pounds.      Depression Screen    04/10/2023    9:25 AM 03/25/2023    1:40 PM 11/27/2022    8:26 AM 08/20/2022    2:52 PM 08/20/2022    2:47 PM 08/06/2022    2:24 PM 06/04/2022   10:10 AM  PHQ 2/9 Scores  PHQ - 2 Score 1 0 0 0 0 0   PHQ- 9 Score 1     0   Exception Documentation       Patient refusal    Fall Risk    04/10/2023    9:18 AM 03/25/2023    1:40 PM 11/27/2022    9:06 AM 11/27/2022    8:25 AM 08/20/2022    2:51 PM  Fall Risk   Falls in the past year? 1 0 1 1 1   Number falls in past yr: 0 0 0 0 0  Injury with Fall? 1 0 1 1 1   Risk for fall due to : No Fall Risks History of fall(s)  History of fall(s) Impaired balance/gait  Follow up  Falls evaluation completed  Falls evaluation completed Falls evaluation completed    MEDICARE RISK AT HOME:  Medicare Risk at Home - 04/10/23 0919     Any stairs in or around the home? Yes    If so, are there any without handrails? Yes    Home free of loose throw rugs in walkways, pet beds, electrical cords, etc? Yes    Adequate lighting in your home to reduce risk of falls? Yes    Life alert? No    Use of a cane, walker or w/c? No    Grab bars in the bathroom? Yes  Shower chair or bench in shower? Yes    Elevated toilet seat or a handicapped toilet? No             TIMED UP AND GO:  Was the test performed?  Yes  Length of time to ambulate 10 feet: 15 sec Gait slow and steady without use of assistive device    Cognitive Function:    11/11/2017    3:08 PM  MMSE - Mini Mental State Exam  Orientation to time 5  Orientation to Place 5  Registration 3  Attention/ Calculation 5  Recall 3  Language-  name 2 objects 2  Language- repeat 1  Language- follow 3 step command 3  Language- read & follow direction 1  Write a sentence 1  Copy design 1  Total score 30        04/10/2023    9:21 AM  6CIT Screen  What Year? 0 points  What month? 0 points  What time? 0 points  Count back from 20 0 points  Months in reverse 0 points  Repeat phrase 0 points  Total Score 0 points    Immunizations Immunization History  Administered Date(s) Administered   Fluad Quad(high Dose 65+) 07/22/2019, 06/16/2020, 05/25/2021, 06/04/2022   PFIZER Comirnaty(Gray Top)Covid-19 Tri-Sucrose Vaccine 11/08/2019, 12/01/2019, 06/23/2020   Pneumococcal Conjugate-13 10/20/2013   Pneumococcal Polysaccharide-23 05/17/2006   Td 04/20/2009   Zoster Recombinant(Shingrix) 10/25/2020, 02/15/2021    TDAP status: Due, Education has been provided regarding the importance of this vaccine. Advised may receive this vaccine at local pharmacy or Health Dept. Aware to provide a copy of the vaccination record if obtained from local pharmacy or Health Dept. Verbalized acceptance and understanding.  Flu Vaccine status: Up to date  Pneumococcal vaccine status: Up to date  Covid-19 vaccine status: Completed vaccines  Qualifies for Shingles Vaccine? Yes   Zostavax completed No   Shingrix Completed?: Yes  Screening Tests Health Maintenance  Topic Date Due   DTaP/Tdap/Td (2 - Tdap) 04/21/2019   COVID-19 Vaccine (4 - 2023-24 season) 05/17/2022   INFLUENZA VACCINE  04/17/2023   Medicare Annual Wellness (AWV)  04/09/2024   Pneumonia Vaccine 39+ Years old  Completed   DEXA SCAN  Completed   Zoster Vaccines- Shingrix  Completed   HPV VACCINES  Aged Out   Colonoscopy  Discontinued   Hepatitis C Screening  Discontinued    Health Maintenance  Health Maintenance Due  Topic Date Due   DTaP/Tdap/Td (2 - Tdap) 04/21/2019   COVID-19 Vaccine (4 - 2023-24 season) 05/17/2022    Colorectal cancer screening: No longer required.    Mammogram status: No longer required due to aged out.  Bone Density status: Completed 11/28/2022. Results reflect: Bone density results: OSTEOPOROSIS. Repeat every 1 years.  Lung Cancer Screening: (Low Dose CT Chest recommended if Age 19-80 years, 20 pack-year currently smoking OR have quit w/in 15years.) does not qualify.   Lung Cancer Screening Referral: N/A  Additional Screening:  Hepatitis C Screening: does not qualify;  Vision Screening: Recommended annual ophthalmology exams for early detection of glaucoma and other disorders of the eye. Is the patient up to date with their annual eye exam?  Yes  Who is the provider or what is the name of the office in which the patient attends annual eye exams? [facility] If pt is not established with a provider, would they like to be referred to a provider to establish care? No .   Dental Screening: Recommended annual dental exams for proper  oral hygiene   Community Resource Referral / Chronic Care Management: CRR required this visit?  No   CCM required this visit?  No     Plan:     I have personally reviewed and noted the following in the patient's chart:   Medical and social history Use of alcohol, tobacco or illicit drugs  Current medications and supplements including opioid prescriptions. Patient is not currently taking opioid prescriptions. Functional ability and status Nutritional status Physical activity Advanced directives List of other physicians Hospitalizations, surgeries, and ER visits in previous 12 months Vitals Screenings to include cognitive, depression, and falls Referrals and appointments  In addition, I have reviewed and discussed with patient certain preventive protocols, quality metrics, and best practice recommendations. A written personalized care plan for preventive services as well as general preventive health recommendations were provided to patient.      L , CMA   04/10/2023   After Visit  Summary: (MyChart) Due to this being a telephonic visit, the after visit summary with patients personalized plan was offered to patient via MyChart   Nurse Notes: Patient is due for a tetanus vaccine.  She has an appointment soon with Dr. Jonny Ruiz and she will discuss during her visit.  I also informed patient that she can get it at her pharmacy as well.

## 2023-04-10 NOTE — Patient Instructions (Signed)
Ms. Docter , Thank you for taking time to come for your Medicare Wellness Visit. I appreciate your ongoing commitment to your health goals. Please review the following plan we discussed and let me know if I can assist you in the future.   Referrals/Orders/Follow-Ups/Clinician Recommendations: Due for a tetanus vaccine.  This is a list of the screening recommended for you and due dates:  Health Maintenance  Topic Date Due   DTaP/Tdap/Td vaccine (2 - Tdap) 04/21/2019   COVID-19 Vaccine (4 - 2023-24 season) 05/17/2022   Flu Shot  04/17/2023   Medicare Annual Wellness Visit  04/09/2024   Pneumonia Vaccine  Completed   DEXA scan (bone density measurement)  Completed   Zoster (Shingles) Vaccine  Completed   HPV Vaccine  Aged Out   Colon Cancer Screening  Discontinued   Hepatitis C Screening  Discontinued    Advanced directives: (Copy Requested) Please bring a copy of your health care power of attorney and living will to the office to be added to your chart at your convenience.  Next Medicare Annual Wellness Visit scheduled for next year: Yes  Preventive Care 57 Years and Older, Female Preventive care refers to lifestyle choices and visits with your health care provider that can promote health and wellness. What does preventive care include? A yearly physical exam. This is also called an annual well check. Dental exams once or twice a year. Routine eye exams. Ask your health care provider how often you should have your eyes checked. Personal lifestyle choices, including: Daily care of your teeth and gums. Regular physical activity. Eating a healthy diet. Avoiding tobacco and drug use. Limiting alcohol use. Practicing safe sex. Taking low-dose aspirin every day. Taking vitamin and mineral supplements as recommended by your health care provider. What happens during an annual well check? The services and screenings done by your health care provider during your annual well check will  depend on your age, overall health, lifestyle risk factors, and family history of disease. Counseling  Your health care provider may ask you questions about your: Alcohol use. Tobacco use. Drug use. Emotional well-being. Home and relationship well-being. Sexual activity. Eating habits. History of falls. Memory and ability to understand (cognition). Work and work Astronomer. Reproductive health. Screening  You may have the following tests or measurements: Height, weight, and BMI. Blood pressure. Lipid and cholesterol levels. These may be checked every 5 years, or more frequently if you are over 38 years old. Skin check. Lung cancer screening. You may have this screening every year starting at age 76 if you have a 30-pack-year history of smoking and currently smoke or have quit within the past 15 years. Fecal occult blood test (FOBT) of the stool. You may have this test every year starting at age 70. Flexible sigmoidoscopy or colonoscopy. You may have a sigmoidoscopy every 5 years or a colonoscopy every 10 years starting at age 9. Hepatitis C blood test. Hepatitis B blood test. Sexually transmitted disease (STD) testing. Diabetes screening. This is done by checking your blood sugar (glucose) after you have not eaten for a while (fasting). You may have this done every 1-3 years. Bone density scan. This is done to screen for osteoporosis. You may have this done starting at age 75. Mammogram. This may be done every 1-2 years. Talk to your health care provider about how often you should have regular mammograms. Talk with your health care provider about your test results, treatment options, and if necessary, the need for more tests.  Vaccines  Your health care provider may recommend certain vaccines, such as: Influenza vaccine. This is recommended every year. Tetanus, diphtheria, and acellular pertussis (Tdap, Td) vaccine. You may need a Td booster every 10 years. Zoster vaccine. You may  need this after age 27. Pneumococcal 13-valent conjugate (PCV13) vaccine. One dose is recommended after age 38. Pneumococcal polysaccharide (PPSV23) vaccine. One dose is recommended after age 68. Talk to your health care provider about which screenings and vaccines you need and how often you need them. This information is not intended to replace advice given to you by your health care provider. Make sure you discuss any questions you have with your health care provider. Document Released: 09/29/2015 Document Revised: 05/22/2016 Document Reviewed: 07/04/2015 Elsevier Interactive Patient Education  2017 [employer].  Fall Prevention in the Home Falls can cause injuries. They can happen to people of all ages. There are many things you can do to make your home safe and to help prevent falls. What can I do on the outside of my home? Regularly fix the edges of walkways and driveways and fix any cracks. Remove anything that might make you trip as you walk through a door, such as a raised step or threshold. Trim any bushes or trees on the path to your home. Use bright outdoor lighting. Clear any walking paths of anything that might make someone trip, such as rocks or tools. Regularly check to see if handrails are loose or broken. Make sure that both sides of any steps have handrails. Any raised decks and porches should have guardrails on the edges. Have any leaves, snow, or ice cleared regularly. Use sand or salt on walking paths during winter. Clean up any spills in your garage right away. This includes oil or grease spills. What can I do in the bathroom? Use night lights. Install grab bars by the toilet and in the tub and shower. Do not use towel bars as grab bars. Use non-skid mats or decals in the tub or shower. If you need to sit down in the shower, use a plastic, non-slip stool. Keep the floor dry. Clean up any water that spills on the floor as soon as it happens. Remove soap buildup in  the tub or shower regularly. Attach bath mats securely with double-sided non-slip rug tape. Do not have throw rugs and other things on the floor that can make you trip. What can I do in the bedroom? Use night lights. Make sure that you have a light by your bed that is easy to reach. Do not use any sheets or blankets that are too big for your bed. They should not hang down onto the floor. Have a firm chair that has side arms. You can use this for support while you get dressed. Do not have throw rugs and other things on the floor that can make you trip. What can I do in the kitchen? Clean up any spills right away. Avoid walking on wet floors. Keep items that you use a lot in easy-to-reach places. If you need to reach something above you, use a strong step stool that has a grab bar. Keep electrical cords out of the way. Do not use floor polish or wax that makes floors slippery. If you must use wax, use non-skid floor wax. Do not have throw rugs and other things on the floor that can make you trip. What can I do with my stairs? Do not leave any items on the stairs. Make sure that  there are handrails on both sides of the stairs and use them. Fix handrails that are broken or loose. Make sure that handrails are as long as the stairways. Check any carpeting to make sure that it is firmly attached to the stairs. Fix any carpet that is loose or worn. Avoid having throw rugs at the top or bottom of the stairs. If you do have throw rugs, attach them to the floor with carpet tape. Make sure that you have a light switch at the top of the stairs and the bottom of the stairs. If you do not have them, ask someone to add them for you. What else can I do to help prevent falls? Wear shoes that: Do not have high heels. Have rubber bottoms. Are comfortable and fit you well. Are closed at the toe. Do not wear sandals. If you use a stepladder: Make sure that it is fully opened. Do not climb a closed  stepladder. Make sure that both sides of the stepladder are locked into place. Ask someone to hold it for you, if possible. Clearly mark and make sure that you can see: Any grab bars or handrails. First and last steps. Where the edge of each step is. Use tools that help you move around (mobility aids) if they are needed. These include: Canes. Walkers. Scooters. Crutches. Turn on the lights when you go into a dark area. Replace any light bulbs as soon as they burn out. Set up your furniture so you have a clear path. Avoid moving your furniture around. If any of your floors are uneven, fix them. If there are any pets around you, be aware of where they are. Review your medicines with your doctor. Some medicines can make you feel dizzy. This can increase your chance of falling. Ask your doctor what other things that you can do to help prevent falls. This information is not intended to replace advice given to you by your health care provider. Make sure you discuss any questions you have with your health care provider. Document Released: 06/29/2009 Document Revised: 02/08/2016 Document Reviewed: 10/07/2014 Elsevier Interactive Patient Education  2017 [employer].

## 2023-05-01 ENCOUNTER — Encounter (INDEPENDENT_AMBULATORY_CARE_PROVIDER_SITE_OTHER): Payer: Self-pay

## 2023-05-30 ENCOUNTER — Ambulatory Visit (INDEPENDENT_AMBULATORY_CARE_PROVIDER_SITE_OTHER): Payer: Medicare HMO | Admitting: Internal Medicine

## 2023-05-30 VITALS — BP 126/82 | HR 67 | Temp 95.2°F | Ht 65.5 in | Wt 156.4 lb

## 2023-05-30 DIAGNOSIS — E7800 Pure hypercholesterolemia, unspecified: Secondary | ICD-10-CM

## 2023-05-30 DIAGNOSIS — F32A Depression, unspecified: Secondary | ICD-10-CM

## 2023-05-30 DIAGNOSIS — E559 Vitamin D deficiency, unspecified: Secondary | ICD-10-CM | POA: Diagnosis not present

## 2023-05-30 DIAGNOSIS — R030 Elevated blood-pressure reading, without diagnosis of hypertension: Secondary | ICD-10-CM | POA: Diagnosis not present

## 2023-05-30 DIAGNOSIS — R739 Hyperglycemia, unspecified: Secondary | ICD-10-CM

## 2023-05-30 DIAGNOSIS — E210 Primary hyperparathyroidism: Secondary | ICD-10-CM

## 2023-05-30 DIAGNOSIS — E538 Deficiency of other specified B group vitamins: Secondary | ICD-10-CM

## 2023-05-30 NOTE — Patient Instructions (Addendum)
Your BP was rechecked today, and continue to watch your BP on a regular basis at home as well  Please continue all other medications as before, and refills have been done if requested.  Please have the pharmacy call with any other refills you may need.  Please continue your efforts at being more active, low cholesterol diet, and weight control  Please keep your appointments with your specialists as you may have planned  We can hold on lab testing today  Please make an Appointment to return in 6 months, or sooner if needed, also with Lab Appointment for testing done 3-5 days before at the FIRST FLOOR Lab (so this is for TWO appointments - please see the scheduling desk as you leave)

## 2023-05-30 NOTE — Progress Notes (Unsigned)
Patient ID: Robyn Bailey, female   DOB: 1941/03/22, 82 y.o.   MRN: [id number]        Chief Complaint: follow up elevated bp, depression, hld, low vit d       HPI:  Robyn Bailey is a 82 y.o. female here overall doing ok,  Pt denies chest pain, increased sob or doe, wheezing, orthopnea, PND, increased LE swelling, palpitations, dizziness or syncope   Pt denies polydipsia, polyuria, or new focal neuro s/s.    Pt denies fever, wt loss, night sweats, loss of appetite, or other constitutional symptoms  left leg fracture resolved.   Had 2 close friends with cancer, 1 passed already, with worsening anxiety depression with meltdown last visit but much improved now.  BP has been controlled at home; initial bp 140/86 today Wt Readings from Last 3 Encounters:  05/30/23 156 lb 6.4 oz (70.9 kg)  04/10/23 156 lb 12.8 oz (71.1 kg)  03/25/23 155 lb 8 oz (70.5 kg)   BP Readings from Last 3 Encounters:  05/30/23 126/82  04/10/23 120/82  03/25/23 118/78         Past Medical History:  Diagnosis Date   ABNORMAL ELECTROCARDIOGRAM 07/19/2007   ALLERGIC RHINITIS 07/19/2007   Anatomical narrow angle glaucoma 04/20/2009   ANXIETY 07/19/2007   BACK PAIN 11/08/2009   Blepharospasm 06/06/2008   COLONIC POLYPS, HX OF 07/19/2007   Cough 09/22/2007   DEPRESSION 07/19/2007   DIVERTICULOSIS, COLON 07/19/2007   Dyspnea    exertion   FATIGUE 04/20/2009   GERD (gastroesophageal reflux disease)    HYPERLIPIDEMIA 07/19/2007   Left sided sciatica 01/22/2011   LOW BACK PAIN 07/19/2007   OSTEOARTHRITIS 07/19/2007   OSTEOPOROSIS 07/19/2007   PERIPHERAL NEUROPATHY 06/08/2007   Ptosis of eyelid, bilateral    RHINITIS 02/27/2009   SINUSITIS- ACUTE-NOS 11/04/2007   SORE THROAT 02/27/2009   Past Surgical History:  Procedure Laterality Date   BREAST BIOPSY     CATARACT EXTRACTION, BILATERAL     COLONOSCOPY     EYE SURGERY     cataract   hammer toe surgury  2003   bilateral   PTOSIS REPAIR Bilateral 10/12/2018   Procedure: Bilateral  ptosis eyelid correction external approach suture technique;  Surgeon: Glenna Fellows, MD;  Location: [facility];  Service: Plastics;  Laterality: Bilateral;   TONSILLECTOMY      reports that she has quit smoking. She has never used smokeless tobacco. She reports that she does not currently use alcohol. She reports that she does not use drugs. family history includes Colon cancer in her maternal uncle and mother. No Known Allergies Current Outpatient Medications on File Prior to Visit  Medication Sig Dispense Refill   albuterol (VENTOLIN HFA) 108 (90 Base) MCG/ACT inhaler Inhale 2 puffs into the lungs every 6 (six) hours as needed for wheezing or shortness of breath. 8 g 3   cetirizine (ZYRTEC) 10 MG tablet Take by mouth.     Cholecalciferol (VITAMIN D) 10 MCG/ML LIQD Vitamin D     meloxicam (MOBIC) 15 MG tablet Take 1 tablet (15 mg total) by mouth daily as needed for pain. 90 tablet 1   MINOXIDIL, TOPICAL, 5 % SOLN Apply 1 application topically daily. Hair     pantoprazole (PROTONIX) 40 MG tablet TAKE 1 TABLET BY MOUTH TWICE A DAY 180 tablet 1   Probiotic Product (ALIGN) 4 MG CAPS Take 4 mg by mouth See admin instructions. Every other month     rosuvastatin (CRESTOR) 10 MG  tablet Take 1 tablet by mouth three times a week 12 tablet 0   triamcinolone (NASACORT AQ) 55 MCG/ACT AERO nasal inhaler Place 2 sprays into the nose daily. (Patient taking differently: Place 2 sprays into the nose daily as needed (congestion).) 1 Inhaler 12   Urea 20 20 % LOTN 1 application as needed Externally Once a day     Vitamin D3 (VITAMIN D) 25 MCG tablet Take 1,000 Units by mouth daily.     co-enzyme Q-10 30 MG capsule Take 30 mg by mouth 3 (three) times daily. (Patient not taking: Reported on 05/30/2023)     linaclotide (LINZESS) 145 MCG CAPS capsule Take 1 capsule (145 mcg total) by mouth daily before breakfast. (Patient not taking: Reported on 05/30/2023) 30 capsule 1   potassium gluconate 595 (99  K) MG TABS tablet Take 595 mg by mouth 3 (three) times a week. (Patient not taking: Reported on 05/30/2023)     No current facility-administered medications on file prior to visit.        ROS:  All others reviewed and negative.  Objective        PE:  BP 126/82 (BP Location: Left Arm, Patient Position: Sitting)   Pulse 67   Temp (!) 95.2 F (35.1 C) (Oral)   Ht 5' 5.5" (1.664 m)   Wt 156 lb 6.4 oz (70.9 kg)   SpO2 98%   BMI 25.63 kg/m                 Constitutional: Pt appears in NAD               HENT: Head: NCAT.                Right Ear: External ear normal.                 Left Ear: External ear normal.                Eyes: . Pupils are equal, round, and reactive to light. Conjunctivae and EOM are normal               Nose: without d/c or deformity               Neck: Neck supple. Gross normal ROM               Cardiovascular: Normal rate and regular rhythm.                 Pulmonary/Chest: Effort normal and breath sounds without rales or wheezing.                Abd:  Soft, NT, ND, + BS, no organomegaly               Neurological: Pt is alert. At baseline orientation, motor grossly intact               Skin: Skin is warm. No rashes, no other new lesions, LE edema - none               Psychiatric: Pt behavior is normal without agitation , sad nervous  Micro: none  Cardiac tracings I have personally interpreted today:  none  Pertinent Radiological findings (summarize): none   Lab Results  Component Value Date   WBC 4.9 11/20/2022   HGB 14.3 11/20/2022   HCT 42.3 11/20/2022   PLT 207.0 11/20/2022   GLUCOSE 79 11/20/2022   CHOL 179 11/20/2022   TRIG 97.0 11/20/2022  HDL 75.30 11/20/2022   LDLDIRECT 134.0 10/20/2013   LDLCALC 85 11/20/2022   ALT 18 11/20/2022   AST 20 11/20/2022   NA 140 11/20/2022   K 3.6 11/20/2022   CL 104 11/20/2022   CREATININE 0.73 11/20/2022   BUN 25 (H) 11/20/2022   CO2 28 11/20/2022   TSH 1.53 11/20/2022   HGBA1C 5.3 11/20/2022    MICROALBUR <0.7 11/20/2022   Assessment/Plan:  Robyn Bailey is a 81 y.o. White or Caucasian [1] female with  has a past medical history of ABNORMAL ELECTROCARDIOGRAM (07/19/2007), ALLERGIC RHINITIS (07/19/2007), Anatomical narrow angle glaucoma (04/20/2009), ANXIETY (07/19/2007), BACK PAIN (11/08/2009), Blepharospasm (06/06/2008), COLONIC POLYPS, HX OF (07/19/2007), Cough (09/22/2007), DEPRESSION (07/19/2007), DIVERTICULOSIS, COLON (07/19/2007), Dyspnea, FATIGUE (04/20/2009), GERD (gastroesophageal reflux disease), HYPERLIPIDEMIA (07/19/2007), Left sided sciatica (01/22/2011), LOW BACK PAIN (07/19/2007), OSTEOARTHRITIS (07/19/2007), OSTEOPOROSIS (07/19/2007), PERIPHERAL NEUROPATHY (06/08/2007), Ptosis of eyelid, bilateral, RHINITIS (02/27/2009), SINUSITIS- ACUTE-NOS (11/04/2007), and SORE THROAT (02/27/2009).  Depression Mild to mod, declines referral for counseling or SSRI, to work on increased activity and social, consider st johns wort,   to f/u any worsening symptoms or concerns   HLD (hyperlipidemia) Lab Results  Component Value Date   LDLCALC 85 11/20/2022   Stable, pt to continue current statin crestor 10 qd   Vitamin D insufficiency Last vitamin D Lab Results  Component Value Date   VD25OH 57.29 11/20/2021   Stable, cont oral replacement   Blood pressure elevated without history of HTN BP Readings from Last 3 Encounters:  05/30/23 126/82  04/10/23 120/82  03/25/23 118/78   Stable with repaet today normal,, pt to cont f/u bp at home  Followup: Return in about 6 months (around 11/27/2023).  Oliver Barre, MD 06/01/2023 6:42 PM [facility]

## 2023-06-01 ENCOUNTER — Encounter: Payer: Self-pay | Admitting: Internal Medicine

## 2023-06-01 DIAGNOSIS — R030 Elevated blood-pressure reading, without diagnosis of hypertension: Secondary | ICD-10-CM | POA: Insufficient documentation

## 2023-06-01 NOTE — Assessment & Plan Note (Signed)
Last vitamin D ?Lab Results  ?Component Value Date  ? VD25OH 57.29 11/20/2021  ? ?Stable, cont oral replacement ? ?

## 2023-06-01 NOTE — Assessment & Plan Note (Signed)
BP Readings from Last 3 Encounters:  05/30/23 126/82  04/10/23 120/82  03/25/23 118/78   Stable with repaet today normal,, pt to cont f/u bp at home

## 2023-06-01 NOTE — Assessment & Plan Note (Signed)
Lab Results  Component Value Date   LDLCALC 85 11/20/2022   Stable, pt to continue current statin crestor 10 qd

## 2023-06-01 NOTE — Assessment & Plan Note (Signed)
Mild to mod, declines referral for counseling or SSRI, to work on increased activity and social, consider st johns wort,   to f/u any worsening symptoms or concerns

## 2023-06-01 NOTE — Addendum Note (Signed)
Addended by: Corwin Levins on: 06/01/2023 06:47 PM   Modules accepted: Level of Service

## 2023-08-16 ENCOUNTER — Other Ambulatory Visit: Payer: Self-pay | Admitting: Internal Medicine

## 2023-08-18 ENCOUNTER — Other Ambulatory Visit: Payer: Self-pay

## 2023-10-09 ENCOUNTER — Other Ambulatory Visit: Payer: Self-pay

## 2023-10-09 ENCOUNTER — Other Ambulatory Visit: Payer: Self-pay | Admitting: Internal Medicine

## 2023-11-17 ENCOUNTER — Other Ambulatory Visit: Payer: Medicare HMO | Admitting: Internal Medicine

## 2023-11-17 ENCOUNTER — Other Ambulatory Visit (INDEPENDENT_AMBULATORY_CARE_PROVIDER_SITE_OTHER): Payer: Medicare HMO

## 2023-11-17 DIAGNOSIS — E538 Deficiency of other specified B group vitamins: Secondary | ICD-10-CM

## 2023-11-17 DIAGNOSIS — R739 Hyperglycemia, unspecified: Secondary | ICD-10-CM | POA: Diagnosis not present

## 2023-11-17 DIAGNOSIS — E559 Vitamin D deficiency, unspecified: Secondary | ICD-10-CM

## 2023-11-17 DIAGNOSIS — E210 Primary hyperparathyroidism: Secondary | ICD-10-CM | POA: Diagnosis not present

## 2023-11-17 DIAGNOSIS — E7800 Pure hypercholesterolemia, unspecified: Secondary | ICD-10-CM

## 2023-11-17 LAB — LIPID PANEL
Cholesterol: 169 mg/dL (ref 0–200)
HDL: 75.2 mg/dL (ref >39.00)
LDL Cholesterol: 79 mg/dL (ref 0–99)
NonHDL: 93.85
Total CHOL/HDL Ratio: 2
Triglycerides: 75 mg/dL (ref 0.0–149.0)
VLDL: 15 mg/dL (ref 0.0–40.0)

## 2023-11-17 LAB — TSH: TSH: 1.54 u[IU]/mL (ref 0.35–5.50)

## 2023-11-17 LAB — URINALYSIS, ROUTINE W REFLEX MICROSCOPIC
Bilirubin Urine: NEGATIVE
Ketones, ur: NEGATIVE
Leukocytes,Ua: NEGATIVE
Nitrite: NEGATIVE
Specific Gravity, Urine: <=1.005 — AB (ref 1.000–1.030)
Total Protein, Urine: NEGATIVE
Urine Glucose: NEGATIVE
Urobilinogen, UA: 0.2 (ref 0.0–1.0)
pH: 6 (ref 5.0–8.0)

## 2023-11-17 LAB — CBC WITH DIFFERENTIAL/PLATELET
Basophils Absolute: 0 10*3/uL (ref 0.0–0.1)
Basophils Relative: 0.6 % (ref 0.0–3.0)
Eosinophils Absolute: 0.2 10*3/uL (ref 0.0–0.7)
Eosinophils Relative: 3.5 % (ref 0.0–5.0)
HCT: 44 % (ref 36.0–46.0)
Hemoglobin: 14.9 g/dL (ref 12.0–15.0)
Lymphocytes Relative: 37.3 % (ref 12.0–46.0)
Lymphs Abs: 1.6 10*3/uL (ref 0.7–4.0)
MCHC: 33.8 g/dL (ref 30.0–36.0)
MCV: 84.4 fl (ref 78.0–100.0)
Monocytes Absolute: 0.5 10*3/uL (ref 0.1–1.0)
Monocytes Relative: 11.2 % (ref 3.0–12.0)
Neutro Abs: 2.1 10*3/uL (ref 1.4–7.7)
Neutrophils Relative %: 47.4 % (ref 43.0–77.0)
Platelets: 195 10*3/uL (ref 150.0–400.0)
RBC: 5.21 Mil/uL — ABNORMAL HIGH (ref 3.87–5.11)
RDW: 13.9 % (ref 11.5–15.5)
WBC: 4.4 10*3/uL (ref 4.0–10.5)

## 2023-11-17 LAB — HEPATIC FUNCTION PANEL
ALT: 13 U/L (ref 0–35)
AST: 17 U/L (ref 0–37)
Albumin: 4 g/dL (ref 3.5–5.2)
Alkaline Phosphatase: 47 U/L (ref 39–117)
Bilirubin, Direct: 0.1 mg/dL (ref 0.0–0.3)
Total Bilirubin: 0.7 mg/dL (ref 0.2–1.2)
Total Protein: 6.8 g/dL (ref 6.0–8.3)

## 2023-11-17 LAB — BASIC METABOLIC PANEL
BUN: 18 mg/dL (ref 6–23)
CO2: 28 meq/L (ref 19–32)
Calcium: 9.3 mg/dL (ref 8.4–10.5)
Chloride: 105 meq/L (ref 96–112)
Creatinine, Ser: 0.79 mg/dL (ref 0.40–1.20)
GFR: 69.47 mL/min (ref >60.00)
Glucose, Bld: 80 mg/dL (ref 70–99)
Potassium: 3.9 meq/L (ref 3.5–5.1)
Sodium: 142 meq/L (ref 135–145)

## 2023-11-17 LAB — VITAMIN B12: Vitamin B-12: 413 pg/mL (ref 211–911)

## 2023-11-17 LAB — HEMOGLOBIN A1C: Hgb A1c MFr Bld: 5.4 % (ref 4.6–6.5)

## 2023-11-17 LAB — VITAMIN D 25 HYDROXY (VIT D DEFICIENCY, FRACTURES): VITD: 49.28 ng/mL (ref 30.00–100.00)

## 2023-11-17 NOTE — Addendum Note (Signed)
 Addended by: Neta Mends D on: 11/17/2023 09:08 AM   Modules accepted: Orders

## 2023-11-20 LAB — PTH, INTACT AND CALCIUM
Calcium: 9.3 mg/dL (ref 8.6–10.4)
PTH: 33 pg/mL (ref 16–77)

## 2023-11-27 ENCOUNTER — Encounter: Payer: Self-pay | Admitting: Internal Medicine

## 2023-11-27 ENCOUNTER — Ambulatory Visit (INDEPENDENT_AMBULATORY_CARE_PROVIDER_SITE_OTHER): Payer: Medicare HMO | Admitting: Internal Medicine

## 2023-11-27 VITALS — BP 120/72 | HR 65 | Temp 97.4°F | Ht 65.5 in | Wt 156.0 lb

## 2023-11-27 DIAGNOSIS — R238 Other skin changes: Secondary | ICD-10-CM | POA: Insufficient documentation

## 2023-11-27 DIAGNOSIS — E538 Deficiency of other specified B group vitamins: Secondary | ICD-10-CM | POA: Diagnosis not present

## 2023-11-27 DIAGNOSIS — L821 Other seborrheic keratosis: Secondary | ICD-10-CM | POA: Insufficient documentation

## 2023-11-27 DIAGNOSIS — E559 Vitamin D deficiency, unspecified: Secondary | ICD-10-CM

## 2023-11-27 DIAGNOSIS — Z0001 Encounter for general adult medical examination with abnormal findings: Secondary | ICD-10-CM

## 2023-11-27 DIAGNOSIS — L219 Seborrheic dermatitis, unspecified: Secondary | ICD-10-CM | POA: Insufficient documentation

## 2023-11-27 DIAGNOSIS — R739 Hyperglycemia, unspecified: Secondary | ICD-10-CM | POA: Diagnosis not present

## 2023-11-27 DIAGNOSIS — L299 Pruritus, unspecified: Secondary | ICD-10-CM | POA: Insufficient documentation

## 2023-11-27 DIAGNOSIS — E7800 Pure hypercholesterolemia, unspecified: Secondary | ICD-10-CM | POA: Diagnosis not present

## 2023-11-27 DIAGNOSIS — E210 Primary hyperparathyroidism: Secondary | ICD-10-CM | POA: Diagnosis not present

## 2023-11-27 DIAGNOSIS — D2372 Other benign neoplasm of skin of left lower limb, including hip: Secondary | ICD-10-CM | POA: Insufficient documentation

## 2023-11-27 MED ORDER — ROSUVASTATIN CALCIUM 10 MG PO TABS: 10.0000 mg | ORAL_TABLET | Freq: Every day | ORAL | 3 refills | Status: DC

## 2023-11-27 MED ORDER — ALBUTEROL SULFATE HFA 108 (90 BASE) MCG/ACT IN AERS: 2.0000 | INHALATION_SPRAY | Freq: Four times a day (QID) | RESPIRATORY_TRACT | 3 refills | Status: AC | PRN

## 2023-11-27 NOTE — Progress Notes (Signed)
 Patient ID: Robyn Bailey, female   DOB: 1941/06/03, 83 y.o.   MRN: [id number]         Chief Complaint:: wellness exam and hyperparathyroidism, hld, low vit d, osteoporosis       HPI:  Robyn Bailey is a 83 y.o. female here for wellness exam; for tdap at pharmacy, declines covid booster, o/w up to date ; due for dxa in 1 more year               Also had fall with tibial fx over 1 yr ago now healed without intervention but wary of further falls.  Pt denies chest pain, increased sob or doe, wheezing, orthopnea, PND, increased LE swelling, palpitations, dizziness or syncope.   Pt denies polydipsia, polyuria, or new focal neuro s/s.    Pt denies fever, wt loss, night sweats, loss of appetite, or other constitutional symptoms   Currently taking statin 3 times per wk only due to myalgias.     Wt Readings from Last 3 Encounters:  11/27/23 156 lb (70.8 kg)  05/30/23 156 lb 6.4 oz (70.9 kg)  04/10/23 156 lb 12.8 oz (71.1 kg)   BP Readings from Last 3 Encounters:  11/27/23 120/72  05/30/23 126/82  04/10/23 120/82   Immunization History  Administered Date(s) Administered   Fluad Quad(high Dose 65+) 07/22/2019, 06/16/2020, 05/25/2021, 06/04/2022, 06/02/2023   PFIZER Comirnaty(Gray Top)Covid-19 Tri-Sucrose Vaccine 11/08/2019, 12/01/2019, 06/23/2020   Pneumococcal Conjugate-13 10/20/2013   Pneumococcal Polysaccharide-23 05/17/2006   Td 04/20/2009   Zoster Recombinant(Shingrix) 10/25/2020, 02/15/2021   Health Maintenance Due  Topic Date Due   DTaP/Tdap/Td (2 - Tdap) 04/21/2019   COVID-19 Vaccine (4 - 2024-25 season) 05/18/2023      Past Medical History:  Diagnosis Date   ABNORMAL ELECTROCARDIOGRAM 07/19/2007   ALLERGIC RHINITIS 07/19/2007   Anatomical narrow angle glaucoma 04/20/2009   ANXIETY 07/19/2007   BACK PAIN 11/08/2009   Blepharospasm 06/06/2008   COLONIC POLYPS, HX OF 07/19/2007   Cough 09/22/2007   DEPRESSION 07/19/2007   DIVERTICULOSIS, COLON 07/19/2007   Dyspnea    exertion   FATIGUE  04/20/2009   GERD (gastroesophageal reflux disease)    HYPERLIPIDEMIA 07/19/2007   Left sided sciatica 01/22/2011   LOW BACK PAIN 07/19/2007   OSTEOARTHRITIS 07/19/2007   OSTEOPOROSIS 07/19/2007   PERIPHERAL NEUROPATHY 06/08/2007   Ptosis of eyelid, bilateral    RHINITIS 02/27/2009   SINUSITIS- ACUTE-NOS 11/04/2007   SORE THROAT 02/27/2009   Past Surgical History:  Procedure Laterality Date   BREAST BIOPSY     CATARACT EXTRACTION, BILATERAL     COLONOSCOPY     EYE SURGERY     cataract   hammer toe surgury  2003   bilateral   PTOSIS REPAIR Bilateral 10/12/2018   Procedure: Bilateral ptosis eyelid correction external approach suture technique;  Surgeon: Glenna Fellows, MD;  Location: [facility];  Service: Plastics;  Laterality: Bilateral;   TONSILLECTOMY      reports that she has quit smoking. She has never used smokeless tobacco. She reports that she does not currently use alcohol. She reports that she does not use drugs. family history includes Colon cancer in her maternal uncle and mother. No Known Allergies Current Outpatient Medications on File Prior to Visit  Medication Sig Dispense Refill   cetirizine (ZYRTEC) 10 MG tablet Take by mouth.     Cholecalciferol (VITAMIN D) 10 MCG/ML LIQD Vitamin D     meloxicam (MOBIC) 15 MG tablet Take 1 tablet (15 mg total) by  mouth daily as needed for pain. 90 tablet 1   pantoprazole (PROTONIX) 40 MG tablet TAKE 1 TABLET BY MOUTH 2 TIMES A DAY 180 tablet 3   Probiotic Product (ALIGN) 4 MG CAPS Take 4 mg by mouth See admin instructions. Every other month     triamcinolone (NASACORT AQ) 55 MCG/ACT AERO nasal inhaler Place 2 sprays into the nose daily. (Patient taking differently: Place 2 sprays into the nose daily as needed (congestion).) 1 Inhaler 12   Vitamin D3 (VITAMIN D) 25 MCG tablet Take 1,000 Units by mouth daily.     Urea 20 20 % LOTN 1 application as needed Externally Once a day (Patient not taking: Reported on 11/27/2023)      No current facility-administered medications on file prior to visit.        ROS:  All others reviewed and negative.  Objective        PE:  BP 120/72 (BP Location: Left Arm, Patient Position: Sitting, Cuff Size: Normal)   Pulse 65   Temp (!) 97.4 F (36.3 C) (Oral)   Ht 5' 5.5" (1.664 m)   Wt 156 lb (70.8 kg)   SpO2 97%   BMI 25.56 kg/m                 Constitutional: Pt appears in NAD               HENT: Head: NCAT.                Right Ear: External ear normal.                 Left Ear: External ear normal.                Eyes: . Pupils are equal, round, and reactive to light. Conjunctivae and EOM are normal               Nose: without d/c or deformity               Neck: Neck supple. Gross normal ROM               Cardiovascular: Normal rate and regular rhythm.                 Pulmonary/Chest: Effort normal and breath sounds without rales or wheezing.                Abd:  Soft, NT, ND, + BS, no organomegaly               Neurological: Pt is alert. At baseline orientation, motor grossly intact               Skin: Skin is warm. No rashes, no other new lesions, LE edema - none               Psychiatric: Pt behavior is normal without agitation   Micro: none  Cardiac tracings I have personally interpreted today:  none  Pertinent Radiological findings (summarize): none   Lab Results  Component Value Date   WBC 4.4 11/17/2023   HGB 14.9 11/17/2023   HCT 44.0 11/17/2023   PLT 195.0 11/17/2023   GLUCOSE 80 11/17/2023   CHOL 169 11/17/2023   TRIG 75.0 11/17/2023   HDL 75.20 11/17/2023   LDLDIRECT 134.0 10/20/2013   LDLCALC 79 11/17/2023   ALT 13 11/17/2023   AST 17 11/17/2023   NA 142 11/17/2023   K 3.9 11/17/2023   CL 105  11/17/2023   CREATININE 0.79 11/17/2023   BUN 18 11/17/2023   CO2 28 11/17/2023   TSH 1.54 11/17/2023   HGBA1C 5.4 11/17/2023   MICROALBUR <0.7 11/20/2022   Assessment/Plan:  Robyn Bailey is a 83 y.o. White or Caucasian [1] female with   has a past medical history of ABNORMAL ELECTROCARDIOGRAM (07/19/2007), ALLERGIC RHINITIS (07/19/2007), Anatomical narrow angle glaucoma (04/20/2009), ANXIETY (07/19/2007), BACK PAIN (11/08/2009), Blepharospasm (06/06/2008), COLONIC POLYPS, HX OF (07/19/2007), Cough (09/22/2007), DEPRESSION (07/19/2007), DIVERTICULOSIS, COLON (07/19/2007), Dyspnea, FATIGUE (04/20/2009), GERD (gastroesophageal reflux disease), HYPERLIPIDEMIA (07/19/2007), Left sided sciatica (01/22/2011), LOW BACK PAIN (07/19/2007), OSTEOARTHRITIS (07/19/2007), OSTEOPOROSIS (07/19/2007), PERIPHERAL NEUROPATHY (06/08/2007), Ptosis of eyelid, bilateral, RHINITIS (02/27/2009), SINUSITIS- ACUTE-NOS (11/04/2007), and SORE THROAT (02/27/2009).  Encounter for well adult exam with abnormal findings Age and sex appropriate education and counseling updated with regular exercise and diet Referrals for preventative services - none needed Immunizations addressed - for tdap at pharmacy, declines covid booster Smoking counseling  - none needed Evidence for depression or other mood disorder - none significant Most recent labs reviewed. I have personally reviewed and have noted: 1) the patient's medical and social history 2) The patient's current medications and supplements 3) The patient's height, weight, and BMI have been recorded in the chart   Hyperparathyroidism, primary [facility]) Lab Results  Component Value Date   PTH 33 11/17/2023   CALCIUM 9.3 11/17/2023   CALCIUM 9.3 11/17/2023   Stable, cont current med tx  HLD (hyperlipidemia) Lab Results  Component Value Date   LDLCALC 79 11/17/2023   Uncontrolled, pt to continue current statin crestor 10 mg at 3 times per wk, declines add zetia or repatha   Vitamin D insufficiency Last vitamin D Lab Results  Component Value Date   VD25OH 49.28 11/17/2023   Stable, cont oral replacement  Followup: Return in about 1 year (around 11/26/2024).  Oliver Barre, MD 11/29/2023 11:28 PM [facility] [place] [facility] - [facility]

## 2023-11-27 NOTE — Patient Instructions (Addendum)
 Please check with insurance about bone density coverage, and let us know if you would want the repeat testing  Please continue all other medications as before, and refills have been done if requested.  Please have the pharmacy call with any other refills you may need.  Please continue your efforts at being more active, low cholesterol diet, and weight control.  You are otherwise up to date with prevention measures today.  Please keep your appointments with your specialists as you may have planned  Please make an Appointment to return for your 1 year visit, or sooner if needed, with Lab testing by Appointment as well, to be done about 3-5 days before at the FIRST FLOOR Lab (so this is for TWO appointments - please see the scheduling desk as you leave)

## 2023-11-29 ENCOUNTER — Encounter: Payer: Self-pay | Admitting: Internal Medicine

## 2023-11-29 NOTE — Assessment & Plan Note (Signed)
Age and sex appropriate education and counseling updated with regular exercise and diet Referrals for preventative services - none needed Immunizations addressed - for tdap at pharmacy, declines covid booster Smoking counseling  - none needed Evidence for depression or other mood disorder - none significant Most recent labs reviewed. I have personally reviewed and have noted: 1) the patient's medical and social history 2) The patient's current medications and supplements 3) The patient's height, weight, and BMI have been recorded in the chart

## 2023-11-29 NOTE — Assessment & Plan Note (Signed)
 Lab Results  Component Value Date   LDLCALC 79 11/17/2023   Uncontrolled, pt to continue current statin crestor 10 mg at 3 times per wk, declines add zetia or repatha

## 2023-11-29 NOTE — Assessment & Plan Note (Signed)
 Last vitamin D Lab Results  Component Value Date   VD25OH 49.28 11/17/2023   Stable, cont oral replacement

## 2023-11-29 NOTE — Assessment & Plan Note (Signed)
 Lab Results  Component Value Date   PTH 33 11/17/2023   CALCIUM 9.3 11/17/2023   CALCIUM 9.3 11/17/2023   Stable, cont current med tx

## 2024-01-14 DIAGNOSIS — Z961 Presence of intraocular lens: Secondary | ICD-10-CM | POA: Diagnosis not present

## 2024-01-14 DIAGNOSIS — H52203 Unspecified astigmatism, bilateral: Secondary | ICD-10-CM | POA: Diagnosis not present

## 2024-02-11 ENCOUNTER — Telehealth: Payer: Self-pay

## 2024-02-11 DIAGNOSIS — B079 Viral wart, unspecified: Secondary | ICD-10-CM | POA: Diagnosis not present

## 2024-02-11 DIAGNOSIS — L814 Other melanin hyperpigmentation: Secondary | ICD-10-CM | POA: Diagnosis not present

## 2024-02-11 DIAGNOSIS — L570 Actinic keratosis: Secondary | ICD-10-CM | POA: Diagnosis not present

## 2024-02-11 DIAGNOSIS — E7800 Pure hypercholesterolemia, unspecified: Secondary | ICD-10-CM

## 2024-02-11 DIAGNOSIS — L986 Other infiltrative disorders of the skin and subcutaneous tissue: Secondary | ICD-10-CM | POA: Diagnosis not present

## 2024-02-11 DIAGNOSIS — B351 Tinea unguium: Secondary | ICD-10-CM | POA: Diagnosis not present

## 2024-02-11 DIAGNOSIS — D485 Neoplasm of uncertain behavior of skin: Secondary | ICD-10-CM | POA: Diagnosis not present

## 2024-02-11 DIAGNOSIS — L821 Other seborrheic keratosis: Secondary | ICD-10-CM | POA: Diagnosis not present

## 2024-02-11 MED ORDER — ROSUVASTATIN CALCIUM 10 MG PO TABS: 10.0000 mg | ORAL_TABLET | ORAL | 3 refills | Status: AC

## 2024-02-11 NOTE — Telephone Encounter (Signed)
 This patient is appearing on a report for being at risk of failing the adherence measure for cholesterol (statin) medications this calendar year.   Medication: rosuvastatin  10 mg Last fill date: 10/09/23 for 90 tablets  Patient taking rosuvastatin  3x/week per last PCP visit on 11/27/23. Spoke with patient who states she's been taking it 3x/week for probably the past 1-1.5 years. Script used for last fill had directions to take daily, which resulted in an incorrect days supply. Patient stated she still has about a half a bottle remaining from the last fill, which is reasonable given the current supply is anticipated to last her until August. Refills sent after last PCP visit still has daily directions listed, therefore will collaborate with embedded pharmacist to update directions.  Anticipate patient will fail adherence measure as last impact date is 03/15/24.  Abelina Abide, PharmD PGY1 Pharmacy Resident 02/11/2024 11:41 AM

## 2024-02-11 NOTE — Telephone Encounter (Signed)
 Rx sent with updated directions to replace previous Rx with daily instructions.  Rainelle Bur, PharmD, BCPS, CPP Clinical Pharmacist Practitioner [facility] [phone]

## 2024-03-01 DIAGNOSIS — M2011 Hallux valgus (acquired), right foot: Secondary | ICD-10-CM | POA: Diagnosis not present

## 2024-03-01 DIAGNOSIS — I70203 Unspecified atherosclerosis of native arteries of extremities, bilateral legs: Secondary | ICD-10-CM | POA: Diagnosis not present

## 2024-03-01 DIAGNOSIS — L603 Nail dystrophy: Secondary | ICD-10-CM | POA: Diagnosis not present

## 2024-03-01 DIAGNOSIS — B351 Tinea unguium: Secondary | ICD-10-CM | POA: Diagnosis not present

## 2024-03-01 DIAGNOSIS — M2012 Hallux valgus (acquired), left foot: Secondary | ICD-10-CM | POA: Diagnosis not present

## 2024-03-12 DIAGNOSIS — L603 Nail dystrophy: Secondary | ICD-10-CM | POA: Diagnosis not present

## 2024-04-09 ENCOUNTER — Telehealth: Payer: Self-pay | Admitting: Pharmacist

## 2024-04-09 NOTE — Progress Notes (Signed)
 Pharmacy Quality Measure Review  This patient is appearing on a report for being at risk of failing the adherence measure for cholesterol (statin) medications this calendar year.   Medication: Rosuvastatin  10 mg Last fill date: 02/11/24 for 28 day supply  Corrected Rx was sent in May since patient is taking 3x per week instead of daily however Rx with incorrect sig was filled earlier this year  therefore patient is not due for refill yet. Will fail the measure this year.  Darrelyn Drum, PharmD, BCPS, CPP Clinical Pharmacist Practitioner [facility] [facility] [phone]

## 2024-04-15 ENCOUNTER — Ambulatory Visit: Payer: Medicare HMO

## 2024-04-15 VITALS — BP 110/72 | HR 59 | Ht 65.5 in | Wt 149.6 lb

## 2024-04-15 DIAGNOSIS — D2370 Other benign neoplasm of skin of unspecified lower limb, including hip: Secondary | ICD-10-CM | POA: Insufficient documentation

## 2024-04-15 DIAGNOSIS — Z0000 Encounter for general adult medical examination without abnormal findings: Secondary | ICD-10-CM

## 2024-04-15 NOTE — Patient Instructions (Signed)
 Robyn Bailey , Thank you for taking time out of your busy schedule to complete your Annual Wellness Visit with me. I enjoyed our conversation and look forward to speaking with you again next year. I, as well as your care team,  appreciate your ongoing commitment to your health goals. Please review the following plan we discussed and let me know if I can assist you in the future. Your Game plan/ To Do List   Follow up Visits: Next Medicare AWV with our clinical staff: 04/20/2025.   Have you seen your provider in the last 6 months (3 months if uncontrolled diabetes)? Yes Next Office Visit with your provider: 11/30/2024.  Clinician Recommendations:  Aim for 30 minutes of exercise or brisk walking, 6-8 glasses of water, and 5 servings of fruits and vegetables each day. You are due for a Tetanus vaccine and can get that done at your pharmacy.  Keep up the good work.      This is a list of the screening recommended for you and due dates:  Health Maintenance  Topic Date Due   DTaP/Tdap/Td vaccine (2 - Tdap) 04/21/2019   COVID-19 Vaccine (4 - 2024-25 season) 05/18/2023   Medicare Annual Wellness Visit  04/09/2024   Flu Shot  04/16/2024   Pneumococcal Vaccine for age over 26  Completed   DEXA scan (bone density measurement)  Completed   Zoster (Shingles) Vaccine  Completed   Hepatitis B Vaccine  Aged Out   HPV Vaccine  Aged Out   Meningitis B Vaccine  Aged Out   Colon Cancer Screening  Discontinued   Hepatitis C Screening  Discontinued    Advanced directives: (Copy Requested) Please bring a copy of your health care power of attorney and living will to the office to be added to your chart at your convenience. You can mail to [facility] [address] [place], [place] [address] or email to [email or web address] Advance Care Planning is important because it:  [x]  Makes sure you receive the medical care that is consistent with your values, goals, and preferences  [x]  It  provides guidance to your family and loved ones and reduces their decisional burden about whether or not they are making the right decisions based on your wishes.  Follow the link provided in your after visit summary or read over the paperwork we have mailed to you to help you started getting your Advance Directives in place. If you need assistance in completing these, please reach out to us  so that we can help you!  See attachments for Preventive Care and Fall Prevention Tips.

## 2024-04-15 NOTE — Progress Notes (Addendum)
 Subjective:   Robyn Bailey is a 83 y.o. who presents for a Medicare Wellness preventive visit.  As a reminder, Annual Wellness Visits don't include a physical exam, and some assessments may be limited, especially if this visit is performed virtually. We may recommend an in-person follow-up visit with your provider if needed.  Visit Complete: In person  Persons Participating in Visit: Patient.  AWV Questionnaire: No: Patient Medicare AWV questionnaire was not completed prior to this visit.  Cardiac Risk Factors include: advanced age (>51men, >58 women);dyslipidemia     Objective:    Today's Vitals   04/15/24 0934  BP: 110/72  Pulse: (!) 59  SpO2: 97%  Weight: 149 lb 9.6 oz (67.9 kg)  Height: 5' 5.5 (1.664 m)   Body mass index is 24.52 kg/m.     04/15/2024    9:50 AM 04/10/2023    9:18 AM 11/22/2021   10:26 AM 02/02/2021   10:38 AM 09/26/2020    1:47 PM 11/12/2018   10:31 AM 10/02/2018   12:23 PM  Advanced Directives  Does Patient Have a Medical Advance Directive? Yes Yes Yes Yes Yes Yes  Yes   Type of Advance Directive Living will Healthcare Power of [place];Living will Living will;Healthcare Power of [employer] Power of [place];Living will Living will Healthcare Power of [place];Living will Healthcare Power of [place];Living will  Does patient want to make changes to medical advance directive?   No - Patient declined  No - Patient declined  No - Patient declined   Copy of Healthcare Power of Attorney in Chart? No - copy requested No - copy requested No - copy requested   No - copy requested       Data saved with a previous flowsheet row definition    Current Medications (verified) Outpatient Encounter Medications as of 04/15/2024  Medication Sig   albuterol  (VENTOLIN  HFA) 108 (90 Base) MCG/ACT inhaler Inhale 2 puffs into the lungs every 6 (six) hours as needed for wheezing or shortness of breath.   cetirizine  (ZYRTEC ) 10 MG tablet Take by mouth.    Cholecalciferol (VITAMIN D ) 10 MCG/ML LIQD Vitamin D    meloxicam  (MOBIC ) 15 MG tablet Take 1 tablet (15 mg total) by mouth daily as needed for pain.   pantoprazole  (PROTONIX ) 40 MG tablet TAKE 1 TABLET BY MOUTH 2 TIMES A DAY   Probiotic Product (ALIGN) 4 MG CAPS Take 4 mg by mouth See admin instructions. Every other month   rosuvastatin  (CRESTOR ) 10 MG tablet Take 1 tablet (10 mg total) by mouth 3 (three) times a week.   triamcinolone  (NASACORT  AQ) 55 MCG/ACT AERO nasal inhaler Place 2 sprays into the nose daily. (Patient taking differently: Place 2 sprays into the nose daily as needed (congestion).)   Urea 20 20 % LOTN 1 application as needed Externally Once a day   Vitamin D3 (VITAMIN D ) 25 MCG tablet Take 1,000 Units by mouth daily.   No facility-administered encounter medications on file as of 04/15/2024.    Allergies (verified) Patient has no known allergies.   History: Past Medical History:  Diagnosis Date   ABNORMAL ELECTROCARDIOGRAM 07/19/2007   ALLERGIC RHINITIS 07/19/2007   Anatomical narrow angle glaucoma 04/20/2009   ANXIETY 07/19/2007   BACK PAIN 11/08/2009   Blepharospasm 06/06/2008   COLONIC POLYPS, HX OF 07/19/2007   Cough 09/22/2007   DEPRESSION 07/19/2007   DIVERTICULOSIS, COLON 07/19/2007   Dyspnea    exertion   FATIGUE 04/20/2009   GERD (gastroesophageal reflux disease)  HYPERLIPIDEMIA 07/19/2007   Left sided sciatica 01/22/2011   LOW BACK PAIN 07/19/2007   OSTEOARTHRITIS 07/19/2007   OSTEOPOROSIS 07/19/2007   PERIPHERAL NEUROPATHY 06/08/2007   Ptosis of eyelid, bilateral    RHINITIS 02/27/2009   SINUSITIS- ACUTE-NOS 11/04/2007   SORE THROAT 02/27/2009   Past Surgical History:  Procedure Laterality Date   BREAST BIOPSY     CATARACT EXTRACTION, BILATERAL     COLONOSCOPY     EYE SURGERY     cataract   hammer toe surgury  2003   bilateral   PTOSIS REPAIR Bilateral 10/12/2018   Procedure: Bilateral ptosis eyelid correction external approach suture technique;  Surgeon:  Arelia Filippo, MD;  Location: [facility];  Service: Plastics;  Laterality: Bilateral;   TONSILLECTOMY     Family History  Problem Relation Age of Onset   Colon cancer Mother    Colon cancer Maternal Uncle    Esophageal cancer Neg Hx    Rectal cancer Neg Hx    Stomach cancer Neg Hx    Social History   Socioeconomic History   Marital status: Single    Spouse name: Not on file   Number of children: Not on file   Years of education: Not on file   Highest education level: Not on file  Occupational History   Occupation: retired  Tobacco Use   Smoking status: Former   Smokeless tobacco: Never  Advertising account planner   Vaping status: Never Used  Substance and Sexual Activity   Alcohol use: Not Currently    Comment: occasional glass of wine; rare now b/c acid reflux   Drug use: No   Sexual activity: Not Currently    Birth control/protection: Post-menopausal  Other Topics Concern   Not on file  Social History Narrative   Lives alone/2025   Social Drivers of Health   Financial Resource Strain: Low Risk  (04/15/2024)   Overall Financial Resource Strain (CARDIA)    Difficulty of Paying Living Expenses: Not hard at all  Food Insecurity: No Food Insecurity (04/15/2024)   Hunger Vital Sign    Worried About Running Out of Food in the Last Year: Never true    Ran Out of Food in the Last Year: Never true  Transportation Needs: No Transportation Needs (04/15/2024)   PRAPARE - Administrator, Civil Service (Medical): No    Lack of Transportation (Non-Medical): No  Physical Activity: Inactive (04/15/2024)   Exercise Vital Sign    Days of Exercise per Week: 0 days    Minutes of Exercise per Session: 0 min  Stress: No Stress Concern Present (04/15/2024)   [employer] of Occupational Health - Occupational Stress Questionnaire    Feeling of Stress: Only a little  Social Connections: Socially Isolated (04/15/2024)   Social Connection and Isolation Panel    Frequency  of Communication with Friends and Family: More than three times a week    Frequency of Social Gatherings with Friends and Family: Once a week    Attends Religious Services: Never    Database administrator or Organizations: No    Attends Engineer, structural: Never    Marital Status: Never married    Tobacco Counseling Counseling given: Not Answered    Clinical Intake:  Pre-visit preparation completed: Yes  Pain : No/denies pain     BMI - recorded: 24.52 Nutritional Status: BMI of 19-24  Normal Nutritional Risks: None Diabetes: No  Lab Results  Component Value Date   HGBA1C  5.4 11/17/2023   HGBA1C 5.3 11/20/2022   HGBA1C 5.4 11/20/2021     How often do you need to have someone help you when you read instructions, pamphlets, or other written materials from your doctor or pharmacy?: 1 - Never  Interpreter Needed?: No  Information entered by :: Ailyne Pawley, RMA   Activities of Daily Living     04/15/2024    9:27 AM  In your present state of health, do you have any difficulty performing the following activities:  Hearing? 0  Vision? 0  Difficulty concentrating or making decisions? 0  Walking or climbing stairs? 0  Dressing or bathing? 0  Doing errands, shopping? 0  Preparing Food and eating ? N  Using the Toilet? N  In the past six months, have you accidently leaked urine? N  Do you have problems with loss of bowel control? N  Managing your Medications? N  Managing your Finances? N  Housekeeping or managing your Housekeeping? N    Patient Care Team: Norleen Lynwood ORN, MD as PCP - Diedre Arelia Filippo, MD as Consulting Physician (Plastic Surgery) Charmayne Molly, MD as Consulting Physician (Ophthalmology)  I have updated your Care Teams any recent Medical Services you may have received from other providers in the past year.     Assessment:   This is a routine wellness examination for Tmya.  Hearing/Vision screen Hearing Screening -  Comments:: Denies hearing difficulties   Vision Screening - Comments:: Wears eyeglasses/ Dr. Baltazar   Goals Addressed               This Visit's Progress     Client understands the importance of follow-up with providers by attending scheduled visits (pt-stated)   On track     My goal is to get back in the gym this month and try to lose at least 10 pounds.       Depression Screen     04/15/2024    9:54 AM 11/27/2023    8:40 AM 04/10/2023    9:25 AM 03/25/2023    1:40 PM 11/27/2022    8:26 AM 08/20/2022    2:52 PM 08/20/2022    2:47 PM  PHQ 2/9 Scores  PHQ - 2 Score 1 0 1 0 0 0 0  PHQ- 9 Score 1 0 1        Fall Risk     04/15/2024    9:51 AM 11/27/2023    8:46 AM 04/10/2023    9:18 AM 03/25/2023    1:40 PM 11/27/2022    9:06 AM  Fall Risk   Falls in the past year? 0 0 1 0 1  Number falls in past yr: 0 0 0 0 0  Injury with Fall? 0 0 1 0 1  Risk for fall due to :  No Fall Risks No Fall Risks History of fall(s)   Follow up Falls evaluation completed;Falls prevention discussed Falls evaluation completed  Falls evaluation completed     MEDICARE RISK AT HOME:  Medicare Risk at Home Any stairs in or around the home?: Yes (basement) If so, are there any without handrails?: No Home free of loose throw rugs in walkways, pet beds, electrical cords, etc?: Yes Adequate lighting in your home to reduce risk of falls?: Yes Life alert?: No Use of a cane, walker or w/c?: No Grab bars in the bathroom?: Yes Shower chair or bench in shower?: Yes Elevated toilet seat or a handicapped toilet?: Yes  TIMED UP AND GO:  Was the test performed?  Yes  Length of time to ambulate 10 feet: 20 sec Gait slow and steady without use of assistive device  Cognitive Function: Declined/Normal: No cognitive concerns noted by patient or family. Patient alert, oriented, able to answer questions appropriately and recall recent events. No signs of memory loss or confusion.    11/11/2017    3:08 PM  MMSE -  Mini Mental State Exam  Orientation to time 5  Orientation to Place 5  Registration 3  Attention/ Calculation 5  Recall 3  Language- name 2 objects 2  Language- repeat 1  Language- follow 3 step command 3  Language- read & follow direction 1  Write a sentence 1  Copy design 1  Total score 30        04/10/2023    9:21 AM  6CIT Screen  What Year? 0 points  What month? 0 points  What time? 0 points  Count back from 20 0 points  Months in reverse 0 points  Repeat phrase 0 points  Total Score 0 points    Immunizations Immunization History  Administered Date(s) Administered   Fluad Quad(high Dose 65+) 07/22/2019, 06/16/2020, 05/25/2021, 06/04/2022, 06/02/2023   PFIZER Comirnaty(Gray Top)Covid-19 Tri-Sucrose Vaccine 11/08/2019, 12/01/2019, 06/23/2020   Pneumococcal Conjugate-13 10/20/2013   Pneumococcal Polysaccharide-23 05/17/2006   Td 04/20/2009   Zoster Recombinant(Shingrix) 10/25/2020, 02/15/2021    Screening Tests Health Maintenance  Topic Date Due   DTaP/Tdap/Td (2 - Tdap) 04/21/2019   COVID-19 Vaccine (4 - 2024-25 season) 05/18/2023   INFLUENZA VACCINE  04/16/2024   Medicare Annual Wellness (AWV)  04/15/2025   Pneumococcal Vaccine: 50+ Years  Completed   DEXA SCAN  Completed   Zoster Vaccines- Shingrix  Completed   Hepatitis B Vaccines  Aged Out   HPV VACCINES  Aged Out   Meningococcal B Vaccine  Aged Out   Colonoscopy  Discontinued   Hepatitis C Screening  Discontinued    Health Maintenance  Health Maintenance Due  Topic Date Due   DTaP/Tdap/Td (2 - Tdap) 04/21/2019   COVID-19 Vaccine (4 - 2024-25 season) 05/18/2023   Health Maintenance Items Addressed: See Nurse Notes at the end of this note  Additional Screening:  Vision Screening: Recommended annual ophthalmology exams for early detection of glaucoma and other disorders of the eye. Would you like a referral to an eye doctor? No    Dental Screening: Recommended annual dental exams for proper  oral hygiene  Community Resource Referral / Chronic Care Management: CRR required this visit?  No   CCM required this visit?  No   Plan:    I have personally reviewed and noted the following in the patient's chart:   Medical and social history Use of alcohol, tobacco or illicit drugs  Current medications and supplements including opioid prescriptions. Patient is not currently taking opioid prescriptions. Functional ability and status Nutritional status Physical activity Advanced directives List of other physicians Hospitalizations, surgeries, and ER visits in previous 12 months Vitals Screenings to include cognitive, depression, and falls Referrals and appointments  In addition, I have reviewed and discussed with patient certain preventive protocols, quality metrics, and best practice recommendations. A written personalized care plan for preventive services as well as general preventive health recommendations were provided to patient.   Dodie Parisi L Malik Paar, CMA   04/15/2024   After Visit Summary: (MyChart) Due to this being a telephonic visit, the after visit summary with patients personalized plan was offered to patient via MyChart   Notes:  Patient is due for a Tdap.  Patient is up to date on all health maintenance with no concerns to address today.

## 2024-07-01 ENCOUNTER — Ambulatory Visit: Payer: Self-pay

## 2024-07-01 NOTE — Telephone Encounter (Signed)
 FYI Only or Action Required?: FYI only for provider.  Patient was last seen in primary care on 11/27/2023 by Robyn Lynwood ORN, MD.  Called Nurse Triage reporting Sinusitis.  Symptoms began yesterday.  Interventions attempted: OTC medications: nasal spray.  Symptoms are: gradually worsening.  Triage Disposition: See Physician Within 24 Hours  Patient/caregiver understands and will follow disposition?: Yes  Copied from CRM [id number]. Topic: Clinical - Red Word Triage >> Jul 01, 2024  8:07 AM Robyn Bailey wrote: [facility] that prompted transfer to Nurse Triage: Patient states she thinks she has a sinus/ear infection. States she has pain behind her ear and hollow hearing since yesterday. The pain goes down her neck and into her shoulders. Reason for Disposition  Earache  Answer Assessment - Initial Assessment Questions 1. LOCATION: Where does it hurt?      Pain behind ear and hollow hearing; neck pain   2. ONSET: When did the sinus pain start?  (e.g., hours, days)      Started yesterday  3. SEVERITY: How bad is the pain?   (Scale 0-10; or none, mild, moderate or severe)     Moderate pain 4. RECURRENT SYMPTOM: Have you ever had sinus problems before? If Yes, ask: When was the last time? and What happened that time?      Yes, has history of sinus infections  5. NASAL CONGESTION: Is the nose blocked? If Yes, ask: Can you open it or must you breathe through your mouth?     Yes  6. NASAL DISCHARGE: Do you have discharge from your nose? If so ask, What color?     No  7. FEVER: Do you have a fever? If Yes, ask: What is it, how was it measured, and when did it start?      No  8. OTHER SYMPTOMS: Do you have any other symptoms? (e.g., sore throat, cough, earache, difficulty breathing)     Ear ache, cough, dizziness  9. PREGNANCY: Is there any chance you are pregnant? When was your last menstrual period?     no  Protocols used: Sinus Pain or Congestion-A-AH

## 2024-07-02 ENCOUNTER — Ambulatory Visit: Admitting: Internal Medicine

## 2024-07-02 ENCOUNTER — Encounter: Payer: Self-pay | Admitting: Internal Medicine

## 2024-07-02 VITALS — BP 128/84 | HR 73 | Temp 97.9°F | Ht 65.5 in | Wt 152.0 lb

## 2024-07-02 DIAGNOSIS — J019 Acute sinusitis, unspecified: Secondary | ICD-10-CM

## 2024-07-02 DIAGNOSIS — J309 Allergic rhinitis, unspecified: Secondary | ICD-10-CM

## 2024-07-02 DIAGNOSIS — E559 Vitamin D deficiency, unspecified: Secondary | ICD-10-CM

## 2024-07-02 DIAGNOSIS — H6992 Unspecified Eustachian tube disorder, left ear: Secondary | ICD-10-CM | POA: Diagnosis not present

## 2024-07-02 DIAGNOSIS — J329 Chronic sinusitis, unspecified: Secondary | ICD-10-CM | POA: Insufficient documentation

## 2024-07-02 MED ORDER — PREDNISONE 10 MG PO TABS: ORAL_TABLET | ORAL | 0 refills | Status: AC

## 2024-07-02 MED ORDER — GUAIFENESIN ER 600 MG PO TB12: 1200.0000 mg | ORAL_TABLET | Freq: Two times a day (BID) | ORAL | 1 refills | Status: AC | PRN

## 2024-07-02 MED ORDER — AZITHROMYCIN 250 MG PO TABS: ORAL_TABLET | ORAL | 1 refills | Status: AC

## 2024-07-02 NOTE — Progress Notes (Signed)
 Patient ID: Robyn Bailey, female   DOB: Sep 07, 1941, 83 y.o.   MRN: [id number]        Chief Complaint: follow up sinusitis, allergic rhinitis, left eustachian tube dysfxn,        HPI:  Robyn Bailey is a 83 y.o. female here overall doing ok except  Here with 2-3 days acute onset fever, facial pain, pressure, headache, general weakness and malaise, and greenish d/c, with mild ST and cough, but pt denies chest pain, wheezing, increased sob or doe, orthopnea, PND, increased LE swelling, palpitations, dizziness or syncope.  Does have several wks ongoing nasal allergy symptoms with clearish congestion, left ear pain and clogged up feeling radiating to left neck, itch and sneezing, without fever, pain, ST, cough, swelling or wheezing.   Pt denies polydipsia, polyuria, or new focal neuro s/s.          Wt Readings from Last 3 Encounters:  07/02/24 152 lb (68.9 kg)  04/15/24 149 lb 9.6 oz (67.9 kg)  11/27/23 156 lb (70.8 kg)   BP Readings from Last 3 Encounters:  07/02/24 128/84  04/15/24 110/72  11/27/23 120/72         Past Medical History:  Diagnosis Date   ABNORMAL ELECTROCARDIOGRAM 07/19/2007   ALLERGIC RHINITIS 07/19/2007   Anatomical narrow angle glaucoma 04/20/2009   ANXIETY 07/19/2007   BACK PAIN 11/08/2009   Blepharospasm 06/06/2008   COLONIC POLYPS, HX OF 07/19/2007   Cough 09/22/2007   DEPRESSION 07/19/2007   DIVERTICULOSIS, COLON 07/19/2007   Dyspnea    exertion   FATIGUE 04/20/2009   GERD (gastroesophageal reflux disease)    HYPERLIPIDEMIA 07/19/2007   Left sided sciatica 01/22/2011   LOW BACK PAIN 07/19/2007   OSTEOARTHRITIS 07/19/2007   OSTEOPOROSIS 07/19/2007   PERIPHERAL NEUROPATHY 06/08/2007   Ptosis of eyelid, bilateral    RHINITIS 02/27/2009   SINUSITIS- ACUTE-NOS 11/04/2007   SORE THROAT 02/27/2009   Past Surgical History:  Procedure Laterality Date   BREAST BIOPSY     CATARACT EXTRACTION, BILATERAL     COLONOSCOPY     EYE SURGERY     cataract   hammer toe surgury  2003    bilateral   PTOSIS REPAIR Bilateral 10/12/2018   Procedure: Bilateral ptosis eyelid correction external approach suture technique;  Surgeon: Arelia Filippo, MD;  Location: [facility];  Service: Plastics;  Laterality: Bilateral;   TONSILLECTOMY      reports that she has quit smoking. She has never used smokeless tobacco. She reports that she does not currently use alcohol. She reports that she does not use drugs. family history includes Colon cancer in her maternal uncle and mother. No Known Allergies Current Outpatient Medications on File Prior to Visit  Medication Sig Dispense Refill   albuterol  (VENTOLIN  HFA) 108 (90 Base) MCG/ACT inhaler Inhale 2 puffs into the lungs every 6 (six) hours as needed for wheezing or shortness of breath. 8 g 3   cetirizine  (ZYRTEC ) 10 MG tablet Take by mouth.     Cholecalciferol (VITAMIN D ) 10 MCG/ML LIQD Vitamin D      meloxicam  (MOBIC ) 15 MG tablet Take 1 tablet (15 mg total) by mouth daily as needed for pain. 90 tablet 1   pantoprazole  (PROTONIX ) 40 MG tablet TAKE 1 TABLET BY MOUTH 2 TIMES A DAY 180 tablet 3   Probiotic Product (ALIGN) 4 MG CAPS Take 4 mg by mouth See admin instructions. Every other month     rosuvastatin  (CRESTOR ) 10 MG tablet Take 1 tablet (10  mg total) by mouth 3 (three) times a week. 12 tablet 3   triamcinolone  (NASACORT  AQ) 55 MCG/ACT AERO nasal inhaler Place 2 sprays into the nose daily. (Patient taking differently: Place 2 sprays into the nose daily as needed (congestion).) 1 Inhaler 12   Urea 20 20 % LOTN 1 application as needed Externally Once a day     Vitamin D3 (VITAMIN D ) 25 MCG tablet Take 1,000 Units by mouth daily.     No current facility-administered medications on file prior to visit.        ROS:  All others reviewed and negative.  Objective        PE:  BP 128/84 (BP Location: Left Arm, Patient Position: Sitting, Cuff Size: Normal)   Pulse 73   Temp 97.9 F (36.6 C) (Oral)   Ht 5' 5.5 (1.664 m)   Wt  152 lb (68.9 kg)   BMI 24.91 kg/m                 Constitutional: Pt appears in NAD               HENT: Head: NCAT.                Right Ear: External ear normal.                 Left Ear: External ear normal. Bilat tm's with mild erythema.  Max sinus areas mild tender.  Pharynx with mild erythema, no exudate               Eyes: . Pupils are equal, round, and reactive to light. Conjunctivae and EOM are normal               Nose: without d/c or deformity               Neck: Neck supple. Gross normal ROM               Cardiovascular: Normal rate and regular rhythm.                 Pulmonary/Chest: Effort normal and breath sounds without rales or wheezing.                Abd:  Soft, NT, ND, + BS, no organomegaly               Neurological: Pt is alert. At baseline orientation, motor grossly intact               Skin: Skin is warm. No rashes, no other new lesions, LE edema - none               Psychiatric: Pt behavior is normal without agitation   Micro: none  Cardiac tracings I have personally interpreted today:  none  Pertinent Radiological findings (summarize): none   Lab Results  Component Value Date   WBC 4.4 11/17/2023   HGB 14.9 11/17/2023   HCT 44.0 11/17/2023   PLT 195.0 11/17/2023   GLUCOSE 80 11/17/2023   CHOL 169 11/17/2023   TRIG 75.0 11/17/2023   HDL 75.20 11/17/2023   LDLDIRECT 134.0 10/20/2013   LDLCALC 79 11/17/2023   ALT 13 11/17/2023   AST 17 11/17/2023   NA 142 11/17/2023   K 3.9 11/17/2023   CL 105 11/17/2023   CREATININE 0.79 11/17/2023   BUN 18 11/17/2023   CO2 28 11/17/2023   TSH 1.54 11/17/2023   HGBA1C 5.4 11/17/2023   Assessment/Plan:  Robyn  Bailey is a 83 y.o. White or Caucasian [1] female with  has a past medical history of ABNORMAL ELECTROCARDIOGRAM (07/19/2007), ALLERGIC RHINITIS (07/19/2007), Anatomical narrow angle glaucoma (04/20/2009), ANXIETY (07/19/2007), BACK PAIN (11/08/2009), Blepharospasm (06/06/2008), COLONIC POLYPS, HX OF (07/19/2007),  Cough (09/22/2007), DEPRESSION (07/19/2007), DIVERTICULOSIS, COLON (07/19/2007), Dyspnea, FATIGUE (04/20/2009), GERD (gastroesophageal reflux disease), HYPERLIPIDEMIA (07/19/2007), Left sided sciatica (01/22/2011), LOW BACK PAIN (07/19/2007), OSTEOARTHRITIS (07/19/2007), OSTEOPOROSIS (07/19/2007), PERIPHERAL NEUROPATHY (06/08/2007), Ptosis of eyelid, bilateral, RHINITIS (02/27/2009), SINUSITIS- ACUTE-NOS (11/04/2007), and SORE THROAT (02/27/2009).  Sinusitis Mild to mod, for antibx course zpack,  to f/u any worsening symptoms or concerns  Allergic rhinitis Mild to mod, for prednisone  taper,  to f/u any worsening symptoms or concerns  Eustachian tube dysfunction Worse on left this episode, for mucinex bid prn   Vitamin D  insufficiency Last vitamin D  Lab Results  Component Value Date   VD25OH 49.28 11/17/2023   Stable, cont oral replacement  Followup: Return if symptoms worsen or fail to improve.  Lynwood Rush, MD 07/02/2024 8:42 PM [facility]

## 2024-07-02 NOTE — Assessment & Plan Note (Signed)
Mild to mod, for antibx course zpack,  to f/u any worsening symptoms or concerns 

## 2024-07-02 NOTE — Assessment & Plan Note (Signed)
 Worse on left this episode, for mucinex bid prn

## 2024-07-02 NOTE — Patient Instructions (Signed)
 Please take all new medication as prescribed  - the antibiotic, prednisone , and mucinex  Please continue all other medications as before, and refills have been done if requested.  Please have the pharmacy call with any other refills you may need.  Please keep your appointments with your specialists as you may have planned

## 2024-07-02 NOTE — Assessment & Plan Note (Signed)
 Mild to mod, for prednisone taper, to f/u any worsening symptoms or concerns

## 2024-07-02 NOTE — Assessment & Plan Note (Signed)
 Last vitamin D Lab Results  Component Value Date   VD25OH 49.28 11/17/2023   Stable, cont oral replacement

## 2024-07-30 ENCOUNTER — Telehealth: Payer: Self-pay

## 2024-07-30 NOTE — Telephone Encounter (Signed)
 Copied from CRM #[id number]. Topic: Appointments - Scheduling Inquiry for Clinic >> Jul 30, 2024  3:24 PM Robyn Bailey wrote: Reason for CRM: Patient has been with Dr. Norleen for 25 years and she is trying to find a new PCP. Patient really wants to stay within the practice and is hoping that Dr. Sagardia would be able to take her on as a patient. Please call patient back if this is doable.

## 2024-07-30 NOTE — Telephone Encounter (Signed)
**Note De-identified  [place] Obfuscation** Please advise 

## 2024-08-02 ENCOUNTER — Telehealth: Payer: Self-pay

## 2024-08-02 NOTE — Telephone Encounter (Signed)
 Ok with me, but needs to be ok with Dr Alvia as well, thanks

## 2024-08-02 NOTE — Telephone Encounter (Signed)
 Copied from CRM #[id number]. Topic: Appointments - Transfer of Care >> Aug 02, 2024 11:26 AM Frederich PARAS wrote: Pt is requesting to transfer FROM: Robyn Bailey  Pt is requesting to transfer TO: Robyn Bailey  Reason for requested transfer: Dr. Rush is retiring  It is the responsibility of the team the patient would like to transfer to (Dr. Ku ) to reach out to the patient if for any reason this transfer is not acceptable.

## 2024-11-22 ENCOUNTER — Other Ambulatory Visit

## 2024-11-30 ENCOUNTER — Encounter: Admitting: Internal Medicine

## 2024-11-30 ENCOUNTER — Encounter: Admitting: Family Medicine

## 2024-12-03 ENCOUNTER — Encounter: Admitting: Family Medicine

## 2025-04-20 ENCOUNTER — Ambulatory Visit
